# Patient Record
Sex: Female | Born: 1960 | Race: White | Hispanic: No | Marital: Single | Smoking: Former smoker
Health system: Southern US, Community
[De-identification: ages and names within clinical notes are randomized; demographics above are authoritative.]

## PROBLEM LIST (undated history)

## (undated) DIAGNOSIS — E039 Hypothyroidism, unspecified: Secondary | ICD-10-CM

## (undated) DIAGNOSIS — E785 Hyperlipidemia, unspecified: Secondary | ICD-10-CM

## (undated) DIAGNOSIS — F32A Depression, unspecified: Secondary | ICD-10-CM

## (undated) DIAGNOSIS — J449 Chronic obstructive pulmonary disease, unspecified: Secondary | ICD-10-CM

## (undated) DIAGNOSIS — J45909 Unspecified asthma, uncomplicated: Secondary | ICD-10-CM

## (undated) DIAGNOSIS — R7303 Prediabetes: Secondary | ICD-10-CM

## (undated) DIAGNOSIS — R011 Cardiac murmur, unspecified: Secondary | ICD-10-CM

## (undated) DIAGNOSIS — G4730 Sleep apnea, unspecified: Secondary | ICD-10-CM

## (undated) DIAGNOSIS — F1011 Alcohol abuse, in remission: Secondary | ICD-10-CM

## (undated) DIAGNOSIS — K219 Gastro-esophageal reflux disease without esophagitis: Secondary | ICD-10-CM

## (undated) DIAGNOSIS — F329 Major depressive disorder, single episode, unspecified: Secondary | ICD-10-CM

## (undated) DIAGNOSIS — R12 Heartburn: Secondary | ICD-10-CM

## (undated) DIAGNOSIS — D649 Anemia, unspecified: Secondary | ICD-10-CM

## (undated) DIAGNOSIS — F1010 Alcohol abuse, uncomplicated: Secondary | ICD-10-CM

## (undated) DIAGNOSIS — K921 Melena: Secondary | ICD-10-CM

## (undated) DIAGNOSIS — K635 Polyp of colon: Secondary | ICD-10-CM

## (undated) DIAGNOSIS — M549 Dorsalgia, unspecified: Secondary | ICD-10-CM

## (undated) DIAGNOSIS — J189 Pneumonia, unspecified organism: Secondary | ICD-10-CM

## (undated) DIAGNOSIS — E669 Obesity, unspecified: Secondary | ICD-10-CM

## (undated) DIAGNOSIS — K5900 Constipation, unspecified: Secondary | ICD-10-CM

## (undated) DIAGNOSIS — Z9289 Personal history of other medical treatment: Secondary | ICD-10-CM

## (undated) DIAGNOSIS — E079 Disorder of thyroid, unspecified: Secondary | ICD-10-CM

## (undated) DIAGNOSIS — E538 Deficiency of other specified B group vitamins: Secondary | ICD-10-CM

## (undated) DIAGNOSIS — F419 Anxiety disorder, unspecified: Secondary | ICD-10-CM

## (undated) HISTORY — DX: Anxiety disorder, unspecified: F41.9

## (undated) HISTORY — DX: Polyp of colon: K63.5

## (undated) HISTORY — DX: Disorder of thyroid, unspecified: E07.9

## (undated) HISTORY — DX: Hyperlipidemia, unspecified: E78.5

## (undated) HISTORY — DX: Melena: K92.1

## (undated) HISTORY — DX: Anemia, unspecified: D64.9

## (undated) HISTORY — DX: Alcohol abuse, uncomplicated: F10.10

## (undated) HISTORY — DX: Gastro-esophageal reflux disease without esophagitis: K21.9

## (undated) HISTORY — DX: Depression, unspecified: F32.A

## (undated) HISTORY — DX: Alcohol abuse, in remission: F10.11

## (undated) HISTORY — DX: Prediabetes: R73.03

## (undated) HISTORY — DX: Heartburn: R12

## (undated) HISTORY — DX: Sleep apnea, unspecified: G47.30

## (undated) HISTORY — DX: Major depressive disorder, single episode, unspecified: F32.9

## (undated) HISTORY — DX: Unspecified asthma, uncomplicated: J45.909

## (undated) HISTORY — DX: Cardiac murmur, unspecified: R01.1

## (undated) HISTORY — DX: Chronic obstructive pulmonary disease, unspecified: J44.9

## (undated) HISTORY — DX: Obesity, unspecified: E66.9

## (undated) HISTORY — DX: Deficiency of other specified B group vitamins: E53.8

## (undated) HISTORY — DX: Personal history of other medical treatment: Z92.89

## (undated) HISTORY — PX: BREAST BIOPSY: SHX20

## (undated) HISTORY — DX: Dorsalgia, unspecified: M54.9

## (undated) HISTORY — DX: Constipation, unspecified: K59.00

---

## 1985-11-26 HISTORY — PX: AUGMENTATION MAMMAPLASTY: SUR837

## 2004-11-26 DIAGNOSIS — Z9289 Personal history of other medical treatment: Secondary | ICD-10-CM

## 2004-11-26 HISTORY — DX: Personal history of other medical treatment: Z92.89

## 2007-09-05 ENCOUNTER — Encounter: Admission: RE | Admit: 2007-09-05 | Discharge: 2007-09-05 | Payer: Self-pay

## 2007-12-15 ENCOUNTER — Encounter: Admission: RE | Admit: 2007-12-15 | Discharge: 2007-12-15 | Payer: Self-pay

## 2007-12-26 ENCOUNTER — Encounter: Admission: RE | Admit: 2007-12-26 | Discharge: 2007-12-26 | Payer: Self-pay

## 2007-12-26 ENCOUNTER — Encounter (INDEPENDENT_AMBULATORY_CARE_PROVIDER_SITE_OTHER): Payer: Self-pay | Admitting: Diagnostic Radiology

## 2008-11-26 HISTORY — PX: LAPAROSCOPIC GASTRIC BANDING: SHX1100

## 2012-09-26 ENCOUNTER — Other Ambulatory Visit (HOSPITAL_BASED_OUTPATIENT_CLINIC_OR_DEPARTMENT_OTHER): Payer: Self-pay | Admitting: *Deleted

## 2012-09-26 DIAGNOSIS — Z1231 Encounter for screening mammogram for malignant neoplasm of breast: Secondary | ICD-10-CM

## 2013-01-05 ENCOUNTER — Ambulatory Visit (HOSPITAL_BASED_OUTPATIENT_CLINIC_OR_DEPARTMENT_OTHER): Payer: Self-pay

## 2013-02-20 ENCOUNTER — Ambulatory Visit (HOSPITAL_BASED_OUTPATIENT_CLINIC_OR_DEPARTMENT_OTHER)
Admission: RE | Admit: 2013-02-20 | Discharge: 2013-02-20 | Disposition: A | Discharge status: Home / Self Care | Payer: BC Managed Care – PPO | Source: Ambulatory Visit | Attending: Medical | Admitting: Medical

## 2013-02-20 DIAGNOSIS — Z1231 Encounter for screening mammogram for malignant neoplasm of breast: Secondary | ICD-10-CM

## 2015-07-20 ENCOUNTER — Other Ambulatory Visit (HOSPITAL_BASED_OUTPATIENT_CLINIC_OR_DEPARTMENT_OTHER): Payer: Self-pay | Admitting: Medical

## 2015-07-20 DIAGNOSIS — Z1231 Encounter for screening mammogram for malignant neoplasm of breast: Secondary | ICD-10-CM

## 2015-08-19 ENCOUNTER — Ambulatory Visit (HOSPITAL_BASED_OUTPATIENT_CLINIC_OR_DEPARTMENT_OTHER)
Admission: RE | Admit: 2015-08-19 | Discharge: 2015-08-19 | Disposition: A | Discharge status: Home / Self Care | Payer: BC Managed Care – PPO | Source: Ambulatory Visit | Attending: Medical | Admitting: Medical

## 2015-08-19 DIAGNOSIS — Z1231 Encounter for screening mammogram for malignant neoplasm of breast: Secondary | ICD-10-CM | POA: Diagnosis present

## 2016-10-03 ENCOUNTER — Other Ambulatory Visit (HOSPITAL_BASED_OUTPATIENT_CLINIC_OR_DEPARTMENT_OTHER): Payer: Self-pay | Admitting: Medical

## 2016-10-03 DIAGNOSIS — Z1231 Encounter for screening mammogram for malignant neoplasm of breast: Secondary | ICD-10-CM

## 2016-10-04 ENCOUNTER — Ambulatory Visit (HOSPITAL_BASED_OUTPATIENT_CLINIC_OR_DEPARTMENT_OTHER)
Admission: RE | Admit: 2016-10-04 | Discharge: 2016-10-04 | Disposition: A | Discharge status: Home / Self Care | Payer: BC Managed Care – PPO | Source: Ambulatory Visit | Attending: Medical | Admitting: Medical

## 2016-10-04 DIAGNOSIS — Z1231 Encounter for screening mammogram for malignant neoplasm of breast: Secondary | ICD-10-CM | POA: Insufficient documentation

## 2018-01-15 ENCOUNTER — Other Ambulatory Visit (HOSPITAL_BASED_OUTPATIENT_CLINIC_OR_DEPARTMENT_OTHER): Payer: Self-pay | Admitting: Medical

## 2018-01-15 DIAGNOSIS — Z1231 Encounter for screening mammogram for malignant neoplasm of breast: Secondary | ICD-10-CM

## 2018-01-27 ENCOUNTER — Encounter (HOSPITAL_BASED_OUTPATIENT_CLINIC_OR_DEPARTMENT_OTHER): Payer: Self-pay

## 2018-01-27 ENCOUNTER — Other Ambulatory Visit: Payer: Self-pay | Admitting: Family Medicine

## 2018-01-27 ENCOUNTER — Ambulatory Visit (HOSPITAL_BASED_OUTPATIENT_CLINIC_OR_DEPARTMENT_OTHER)
Admission: RE | Admit: 2018-01-27 | Discharge: 2018-01-27 | Disposition: A | Discharge status: Home / Self Care | Payer: BC Managed Care – PPO | Source: Ambulatory Visit | Attending: Medical | Admitting: Medical

## 2018-01-27 ENCOUNTER — Ambulatory Visit: Payer: BC Managed Care – PPO | Admitting: Family Medicine

## 2018-01-27 ENCOUNTER — Encounter: Payer: Self-pay | Admitting: Family Medicine

## 2018-01-27 VITALS — BP 118/82 | HR 93 | Temp 98.6°F | Ht 64.0 in | Wt 297.0 lb

## 2018-01-27 DIAGNOSIS — E039 Hypothyroidism, unspecified: Secondary | ICD-10-CM | POA: Diagnosis not present

## 2018-01-27 DIAGNOSIS — Z1231 Encounter for screening mammogram for malignant neoplasm of breast: Secondary | ICD-10-CM | POA: Diagnosis present

## 2018-01-27 DIAGNOSIS — Z9882 Breast implant status: Secondary | ICD-10-CM | POA: Insufficient documentation

## 2018-01-27 DIAGNOSIS — J449 Chronic obstructive pulmonary disease, unspecified: Secondary | ICD-10-CM | POA: Insufficient documentation

## 2018-01-27 DIAGNOSIS — G8929 Other chronic pain: Secondary | ICD-10-CM

## 2018-01-27 DIAGNOSIS — M25572 Pain in left ankle and joints of left foot: Secondary | ICD-10-CM | POA: Diagnosis not present

## 2018-01-27 LAB — T4, FREE: Free T4: 1.01 ng/dL (ref 0.60–1.60)

## 2018-01-27 LAB — TSH: TSH: 3.47 u[IU]/mL (ref 0.35–4.50)

## 2018-01-27 MED ORDER — LEVOTHYROXINE SODIUM 150 MCG PO TABS: 150.0000 ug | ORAL_TABLET | Freq: Every day | ORAL | 2 refills | Status: DC

## 2018-01-27 MED ORDER — ALBUTEROL SULFATE 108 (90 BASE) MCG/ACT IN AEPB: 1.0000 | INHALATION_SPRAY | RESPIRATORY_TRACT | 1 refills | Status: DC | PRN

## 2018-01-27 MED ORDER — UMECLIDINIUM BROMIDE 62.5 MCG/INH IN AEPB: 1.0000 | INHALATION_SPRAY | Freq: Every day | RESPIRATORY_TRACT | 5 refills | Status: DC

## 2018-01-27 NOTE — Progress Notes (Signed)
Chief Complaint  Patient presents with  . Establish Care       New Patient Visit SUBJECTIVE: HPI: Pamela [name] is an 57 y.o.female who is being seen for establishing care.  The patient was previously seen at [facility].    Pt had an ankle injury in Dec, 2018. Swelling was significant then, better now. Still having pain radiate up the outside of her L leg and in the front of her ankle. She was evaluated, but never had imaging and was not getting stretches/exercises. No numbness or tingling.  COPD Hx of COPD, has also been told she has asthma. In her records, dx is COPD with asthma. Has not had PFT's in several years. She has sig dyspnea on exertion, albuterol helps. She is also on Advair twice daily. She continues to intermittently smoke. She would like to exercise, but cannot 2/2 breathing.  No Known Allergies  Past Medical History:  Diagnosis Date  . Asthma   . Blood in stool   . Colon polyps   . Depression   . GERD (gastroesophageal reflux disease)   . Heart murmur   . History of blood transfusion 2006  . Thyroid disease   . UTI (urinary tract infection)    Past Surgical History:  Procedure Laterality Date  . AUGMENTATION MAMMAPLASTY    . BREAST BIOPSY    . LAPAROSCOPIC GASTRIC BANDING  2010   Social History   Socioeconomic History  . Marital status: Single  Tobacco Use  . Smoking status: Former Games developer  . Smokeless tobacco: Never Used  Substance and Sexual Activity  . Alcohol use: No    Frequency: Never  . Drug use: No   Family History  Problem Relation Age of Onset  . Alcohol abuse Mother   . Depression Mother   . Early death Mother   . Alcohol abuse Father      Current Outpatient Medications:  .  albuterol (2.5 MG/3ML) 0.083% NEBU 3 mL, albuterol (5 MG/ML) 0.5% NEBU 0.5 mL, Inhale 3 mg into the lungs as needed., Disp: , Rfl:  .  Albuterol Sulfate (PROAIR RESPICLICK) 108 (90 Base) MCG/ACT AEPB, Inhale 1 puff into the lungs as needed., Disp: , Rfl:  .   citalopram (CELEXA) 20 MG tablet, Take 20 mg by mouth daily., Disp: , Rfl:  .  clobetasol (TEMOVATE) 0.05 % external solution, Apply 1 application topically 2 (two) times daily., Disp: , Rfl:  .  Fluticasone-Salmeterol (ADVAIR) 500-50 MCG/DOSE AEPB, Inhale 1 puff into the lungs 2 (two) times daily., Disp: , Rfl:  .  levothyroxine (SYNTHROID, LEVOTHROID) 150 MCG tablet, Take 1 tablet (150 mcg total) by mouth daily before breakfast., Disp: 30 tablet, Rfl: 2 .  modafinil (PROVIGIL) 200 MG tablet, Take 200 mg by mouth daily., Disp: , Rfl:  .  zolpidem (AMBIEN) 10 MG tablet, Take 10 mg by mouth at bedtime as needed for sleep., Disp: , Rfl:  .  umeclidinium bromide (INCRUSE ELLIPTA) 62.5 MCG/INH AEPB, Inhale 1 puff into the lungs daily., Disp: 30 each, Rfl: 5  No LMP recorded. Patient is not currently having periods (Reason: Perimenopausal).  ROS Cardiovascular: Denies chest pain  Respiratory: Denies dyspnea   OBJECTIVE: BP 118/82 (BP Location: Left Arm, Patient Position: Sitting, Cuff Size: Large)   Pulse 93   Temp 98.6 F (37 C) (Oral)   Ht 5\' 4"  (1.626 m)   Wt 297 lb (134.7 kg)   SpO2 94%   BMI 50.98 kg/m   Constitutional: -  VS  reviewed -  Well developed, well nourished, appears stated age -  No apparent distress  Psychiatric: -  Oriented to person, place, and time -  Memory intact -  Affect and mood normal -  Fluent conversation, good eye contact -  Judgment and insight age appropriate  Eye: -  Conjunctivae clear, no discharge -  Pupils symmetric, round, reactive to light  ENMT: -  MMM    Pharynx moist, no exudate, no erythema  Neck: -  No gross swelling, no palpable masses  Cardiovascular: -  RRR -  No bruits  Respiratory: -  Normal respiratory effort, no accessory muscle use, no retraction -  Breath sounds equal, no wheezes, no ronchi, no crackles  Musculoskeletal: -  No clubbing, no cyanosis -  +TTP over ATFL, no bony ttp, no edema, neg ant drawer at ankle, neg squeeze   Skin: -  No significant lesion on inspection -  Warm and dry to palpation   ASSESSMENT/PLAN: Chronic obstructive pulmonary disease, unspecified COPD type (HCC) - Plan: umeclidinium bromide (INCRUSE ELLIPTA) 62.5 MCG/INH AEPB  Chronic pain of left ankle  Hypothyroidism, unspecified type - Plan: levothyroxine (SYNTHROID, LEVOTHROID) 150 MCG tablet, TSH, T4, free  Patient instructed to sign release of records form from her previous PCP. Add anticholinergic. If no improvement, would add Singulair. Theophylline if no better after that.  Stretches/exercises for ankle. If no better, will get MRI vs PT (if MRI not covered). Ck thyroid levels.  Patient should return in 1 mo.. The patient voiced understanding and agreement to the plan.   Jilda Rocheicholas Paul WestonWendling, DO 01/27/18  12:15 PM

## 2018-01-27 NOTE — Progress Notes (Signed)
Pre visit review using our clinic review tool, if applicable. No additional management support is needed unless otherwise documented below in the visit note. 

## 2018-01-27 NOTE — Patient Instructions (Addendum)
Heat (pad or rice pillow in microwave) over affected area, 10-15 minutes twice daily.   Ice/cold pack over area for 10-15 min twice daily.  Stop smoking.  Call Center for [facility] at [facility] at [phone] for an appointment.  They are located at [address], [place], [place], [address] (right across the hall from our office).   Ankle Exercises It is normal to feel mild stretching, pulling, tightness, or discomfort as you do these exercises, but you should stop right away if you feel sudden pain or your pain gets worse.  Stretching and range of motion exercises These exercises warm up your muscles and joints and improve the movement and flexibility of your ankle. These exercises also help to relieve pain, numbness, and tingling. Exercise A: Dorsiflexion/Plantar Flexion   1. Sit with your affected knee straight or bent. Do not rest your foot on anything. 2. Flex your affected ankle to tilt the top of your foot toward your shin. 3. Hold this position for 5 seconds. 4. Point your toes downward to tilt the top of your foot away from your shin. 5. Hold this position for 5 seconds. Repeat 2 times. Complete this exercise 3 times per week. Exercise B: Ankle Alphabet   1. Sit with your affected foot supported at your lower leg. ? Do not rest your foot on anything. ? Make sure your foot has room to move freely. 2. Think of your affected foot as a paintbrush, and move your foot to trace each letter of the alphabet in the air. Keep your hip and knee still while you trace. Make the letters as large as you can without increasing any discomfort. 3. Trace every letter from A to Z. Repeat 2 times. Complete this exercise 3 times per week. Strengthening exercises These exercises build strength and endurance in your ankle. Endurance is the ability to use your muscles for a long time, even after they get tired. Exercise D: Dorsiflexors   1. Secure a rubber  exercise band or tube to an object, such as a table leg, that will stay still when the band is pulled. Secure the other end around your affected foot. 2. Sit on the floor, facing the object with your affected leg extended. The band or tube should be slightly tense when your foot is relaxed. 3. Slowly flex your affected ankle and toes to bring your foot toward you. 4. Hold this position for 3 seconds.  5. Slowly return your foot to the starting position, controlling the band as you do that. Do a total of 10 repetitions. Repeat 2 times. Complete this exercise 3 times per week. Exercise E: Plantar Flexors   1. Sit on the floor with your affected leg extended. 2. Loop a rubber exercise band or tube around the ball of your affected foot. The ball of your foot is on the walking surface, right under your toes. The band or tube should be slightly tense when your foot is relaxed. 3. Slowly point your toes downward, pushing them away from you. 4. Hold this position for 3 seconds. 5. Slowly release the tension in the band or tube, controlling smoothly until your foot is back in the starting position. Repeat for a total of 10 repetitions. Repeat 2 times. Complete this exercise 3 times per week. Exercise F: Towel Curls   1. Sit in a chair on a non-carpeted surface, and put your feet on the floor. 2. Place a towel in front of your  feet.  3. Keeping your heel on the floor, put your affected foot on the towel. 4. Pull the towel toward you by grabbing the towel with your toes and curling them under. Keep your heel on the floor. 5. Let your toes relax. 6. Grab the towel again. Keep going until the towel is completely underneath your foot. Repeat for a total of 10 repetitions. Repeat 2 times. Complete this exercise 3 times per week. Exercise G: Heel Raise ( Plantar Flexors, Standing)    1. Stand with your feet shoulder-width apart. 2. Keep your weight spread evenly over the width of your feet while you  rise up on your toes. Use a wall or table to steady yourself, but try not to use it for support. 3. If this exercise is too easy, try these options: ? Shift your weight toward your affected leg until you feel challenged. ? If told by your health care provider, lift your uninjured leg off the floor. 4. Hold this position for 3 seconds. Repeat for a total of 10 repetitions. Repeat 2 times. Complete this exercise 3 times per week. Exercise H: Tandem Walking 1. Stand with one foot directly in front of the other. 2. Slowly raise your back foot up, lifting your heel before your toes, and place it directly in front of your other foot. 3. Continue to walk in this heel-to-toe way for 10 steps or for as long as told by your health care provider. Have a countertop or wall nearby to use if needed to keep your balance, but try not to hold onto anything for support. Repeat 2 times. Complete this exercises 3 times per week. Make sure you discuss any questions you have with your health care provider. Document Released: 09/26/2005 Document Revised: 07/12/2016 Document Reviewed: 07/31/2015 Elsevier Interactive Patient Education  2018 [employer]

## 2018-03-03 ENCOUNTER — Ambulatory Visit: Payer: BC Managed Care – PPO | Admitting: Family Medicine

## 2018-03-06 ENCOUNTER — Ambulatory Visit: Payer: BC Managed Care – PPO | Admitting: Family Medicine

## 2018-03-13 ENCOUNTER — Ambulatory Visit: Payer: BC Managed Care – PPO | Admitting: Family Medicine

## 2018-03-13 ENCOUNTER — Encounter: Payer: Self-pay | Admitting: Family Medicine

## 2018-03-13 VITALS — BP 130/88 | HR 86 | Temp 98.5°F | Ht 64.0 in | Wt 300.2 lb

## 2018-03-13 DIAGNOSIS — G8929 Other chronic pain: Secondary | ICD-10-CM

## 2018-03-13 DIAGNOSIS — M25572 Pain in left ankle and joints of left foot: Secondary | ICD-10-CM | POA: Diagnosis not present

## 2018-03-13 DIAGNOSIS — J449 Chronic obstructive pulmonary disease, unspecified: Secondary | ICD-10-CM | POA: Insufficient documentation

## 2018-03-13 MED ORDER — FLUTICASONE-SALMETEROL 500-50 MCG/DOSE IN AEPB: 1.0000 | INHALATION_SPRAY | Freq: Two times a day (BID) | RESPIRATORY_TRACT | 11 refills | Status: DC

## 2018-03-13 NOTE — Progress Notes (Signed)
Musculoskeletal Exam  Patient: Pamela [name] DOB: 08/27/1961  DOS: 03/13/2018  SUBJECTIVE:  Chief Complaint:   Chief Complaint  Patient presents with  . Ankle Pain    Pamela [name] is a 57 y.o.  female for evaluation and treatment of L ankle pain.   Onset: Dec 2018 Location: Has moved to lateral L ankle Reports around 50% improvement and continues to improve since seeing me last.  Associated symptoms: Worse when she wears heels Neurovascular symptoms: no  She is also frustrated w wt issue. Reports eating cleanly, tries to stay physically active. She has had a lap band in past. Not interested in any weight loss medicine.   Of note, she does report improvement with the Incruse. There is at least a component of COPD present given this.   ROS: Musculoskeletal/Extremities: +L ankle pain  Past Medical History:  Diagnosis Date  . Asthma   . Blood in stool   . Colon polyps   . COPD (chronic obstructive pulmonary disease) (HCC)   . Depression   . GERD (gastroesophageal reflux disease)   . Heart murmur   . History of alcohol abuse   . History of blood transfusion 2006  . Thyroid disease     Objective: VITAL SIGNS: BP 130/88 (BP Location: Left Arm, Patient Position: Sitting, Cuff Size: Large)   Pulse 86   Temp 98.5 F (36.9 C) (Oral)   Ht 5\' 4"  (1.626 m)   Wt (!) 300 lb 4 oz (136.2 kg)   SpO2 97%   BMI 51.54 kg/m  Constitutional: Well formed, well developed. No acute distress. Cardiovascular: Brisk cap refill Thorax & Lungs: No accessory muscle use Musculoskeletal: L ankle.   Normal active range of motion: yes.   Normal passive range of motion: yes Tenderness to palpation: mild ttp over msc posterior to fib Deformity: no Ecchymosis: no Tests positive: none Tests negative: squeeze, anterior drawre Neurologic: Normal sensory function. No focal deficits noted.  Psychiatric: Normal mood. Age appropriate judgment and insight. Alert & oriented x 3.     Assessment:  Chronic pain of left ankle  Morbid obesity (HCC) - Plan: Amb Ref to Medical Weight Management  Chronic obstructive pulmonary disease, unspecified COPD type (HCC)  Plan: Orders as above. Cont current therapy, offered PT but since she is improving, will hold off. Refer to Dr. Francena HanlyBeasley's team. Counseled on diet and exercise. Cont above meds . F/u for cpe at earliest convenience. The patient voiced understanding and agreement to the plan.   Jilda Rocheicholas Paul AshleyWendling, DO 03/13/18  10:04 AM

## 2018-03-13 NOTE — Progress Notes (Signed)
Pre visit review using our clinic review tool, if applicable. No additional management support is needed unless otherwise documented below in the visit note. 

## 2018-03-13 NOTE — Patient Instructions (Signed)
If you do not hear anything about your referral in the next 1-2 weeks, call our office and ask for an update.  Continue the exercises.  Heat (pad or rice pillow in microwave) over affected area, 10-15 minutes twice daily.   Let us know if you need anything.

## 2018-05-08 ENCOUNTER — Other Ambulatory Visit: Payer: Self-pay | Admitting: Family Medicine

## 2018-05-08 ENCOUNTER — Telehealth: Payer: Self-pay | Admitting: Family Medicine

## 2018-05-08 NOTE — Telephone Encounter (Signed)
Copied from CRM [id number]. Topic: Quick Communication - Rx Refill/Question >> May 08, 2018 10:46 AM Oneal GroutSebastian, Jennifer S wrote: Medication: Albuterol Sulfate (PROAIR RESPICLICK) 108 (90 Base) MCG/ACT AEPB  Has the patient contacted their pharmacy? Yes.   (Agent: If no, request that the patient contact the pharmacy for the refill.) (Agent: If yes, when and what did the pharmacy advise?)  Preferred Pharmacy (with phone number or street name): [facility] on [address]  Agent: Please be advised that RX refills may take up to 3 business days. We ask that you follow-up with your pharmacy.

## 2018-05-22 ENCOUNTER — Encounter: Payer: Self-pay | Admitting: Family Medicine

## 2018-05-22 ENCOUNTER — Encounter (INDEPENDENT_AMBULATORY_CARE_PROVIDER_SITE_OTHER): Payer: Self-pay

## 2018-05-23 ENCOUNTER — Encounter: Payer: Self-pay | Admitting: Family Medicine

## 2018-05-23 ENCOUNTER — Ambulatory Visit (INDEPENDENT_AMBULATORY_CARE_PROVIDER_SITE_OTHER): Payer: BC Managed Care – PPO | Admitting: Family Medicine

## 2018-05-23 VITALS — BP 128/82 | HR 79 | Temp 98.6°F | Ht 64.0 in | Wt 299.2 lb

## 2018-05-23 DIAGNOSIS — Z1159 Encounter for screening for other viral diseases: Secondary | ICD-10-CM | POA: Diagnosis not present

## 2018-05-23 DIAGNOSIS — Z0001 Encounter for general adult medical examination with abnormal findings: Secondary | ICD-10-CM | POA: Diagnosis not present

## 2018-05-23 DIAGNOSIS — Z114 Encounter for screening for human immunodeficiency virus [HIV]: Secondary | ICD-10-CM

## 2018-05-23 DIAGNOSIS — L309 Dermatitis, unspecified: Secondary | ICD-10-CM | POA: Diagnosis not present

## 2018-05-23 DIAGNOSIS — B354 Tinea corporis: Secondary | ICD-10-CM

## 2018-05-23 DIAGNOSIS — Z23 Encounter for immunization: Secondary | ICD-10-CM

## 2018-05-23 DIAGNOSIS — Z0000 Encounter for general adult medical examination without abnormal findings: Secondary | ICD-10-CM

## 2018-05-23 LAB — COMPREHENSIVE METABOLIC PANEL
ALT: 13 U/L (ref 0–35)
AST: 14 U/L (ref 0–37)
Albumin: 4 g/dL (ref 3.5–5.2)
Alkaline Phosphatase: 79 U/L (ref 39–117)
BILIRUBIN TOTAL: 0.4 mg/dL (ref 0.2–1.2)
BUN: 14 mg/dL (ref 6–23)
CO2: 28 meq/L (ref 19–32)
CREATININE: 0.62 mg/dL (ref 0.40–1.20)
Calcium: 9.2 mg/dL (ref 8.4–10.5)
Chloride: 103 mEq/L (ref 96–112)
GFR: 105.11 mL/min (ref >60.00)
GLUCOSE: 108 mg/dL — AB (ref 70–99)
Potassium: 4.6 mEq/L (ref 3.5–5.1)
Sodium: 139 mEq/L (ref 135–145)
TOTAL PROTEIN: 6.6 g/dL (ref 6.0–8.3)

## 2018-05-23 LAB — LIPID PANEL
CHOL/HDL RATIO: 5
Cholesterol: 253 mg/dL — ABNORMAL HIGH (ref 0–200)
HDL: 51.5 mg/dL (ref >39.00)
LDL Cholesterol: 185 mg/dL — ABNORMAL HIGH (ref 0–99)
NONHDL: 201.34
Triglycerides: 83 mg/dL (ref 0.0–149.0)
VLDL: 16.6 mg/dL (ref 0.0–40.0)

## 2018-05-23 MED ORDER — TRIAMCINOLONE ACETONIDE 0.1 % EX CREA: TOPICAL_CREAM | CUTANEOUS | 0 refills | Status: AC

## 2018-05-23 MED ORDER — KETOCONAZOLE 2 % EX CREA: TOPICAL_CREAM | CUTANEOUS | 0 refills | Status: DC

## 2018-05-23 NOTE — Progress Notes (Signed)
Pre visit review using our clinic review tool, if applicable. No additional management support is needed unless otherwise documented below in the visit note. 

## 2018-05-23 NOTE — Patient Instructions (Addendum)
Call your pharmacy to see about the availability of the new shingles vaccine (Shingrix).  Consider getting an ankle brace for swelling and support. Let me know if you change your mind about seeing physical therapy or a sports doc.   Let us know if you need anything.

## 2018-05-23 NOTE — Progress Notes (Signed)
Chief Complaint  Patient presents with  . Annual Exam     Well Woman Pamela [name] is here for a complete physical.   Her last physical was >1 year ago.  Current diet: in general, a "healthy" diet. Current exercise: 3x/week does water aerobics.  Weight is stable and she denies daytime fatigue. No LMP recorded. (Menstrual status: Perimenopausal). Seatbelt? Yes  Health Maintenance Pap/HPV- Yes Mammogram- Yes Tetanus- No Hep C screening- No HIV screening- No  Past Medical History:  Diagnosis Date  . Asthma   . Blood in stool   . Colon polyps   . COPD (chronic obstructive pulmonary disease) (HCC)   . Depression   . GERD (gastroesophageal reflux disease)   . Heart murmur   . History of alcohol abuse   . History of blood transfusion 2006  . Thyroid disease      Past Surgical History:  Procedure Laterality Date  . AUGMENTATION MAMMAPLASTY    . BREAST BIOPSY    . LAPAROSCOPIC GASTRIC BANDING  2010    Medications  Current Outpatient Medications on File Prior to Visit  Medication Sig Dispense Refill  . albuterol (2.5 MG/3ML) 0.083% NEBU 3 mL, albuterol (5 MG/ML) 0.5% NEBU 0.5 mL Inhale 3 mg into the lungs as needed.    . citalopram (CELEXA) 20 MG tablet Take 20 mg by mouth daily.    . clobetasol (TEMOVATE) 0.05 % external solution Apply 1 application topically 2 (two) times daily.    . Fluticasone-Salmeterol (ADVAIR) 500-50 MCG/DOSE AEPB Inhale 1 puff into the lungs 2 (two) times daily. 60 each 11  . levothyroxine (SYNTHROID, LEVOTHROID) 150 MCG tablet Take 1 tablet (150 mcg total) by mouth daily before breakfast. 30 tablet 2  . modafinil (PROVIGIL) 200 MG tablet Take 200 mg by mouth daily.    [name]. PROAIR RESPICLICK 108 (90 Base) MCG/ACT AEPB INHALE 1 PUFF BY MOUTH( INTO THE LUNGS) AS NEEDED 1 each 3  . umeclidinium bromide (INCRUSE ELLIPTA) 62.5 MCG/INH AEPB Inhale 1 puff into the lungs daily. 30 each 5  . zolpidem (AMBIEN) 10 MG tablet Take 10 mg by mouth at bedtime as  needed for sleep.     Allergies No Known Allergies  Review of Systems: Constitutional:  no unexpected weight changes Eye:  no recent significant change in vision Ear/Nose/Mouth/Throat:  Ears:  no tinnitus or vertigo and no recent change in hearing Nose/Mouth/Throat:  no complaints of nasal congestion, no sore throat Cardiovascular: no chest pain Respiratory:  no cough and no shortness of breath Gastrointestinal:  no abdominal pain, no change in bowel habits GU:  Female: negative for dysuria or pelvic pain Musculoskeletal/Extremities: +L ankle pain/swelling; otherwise no pain of the joints Integumentary (Skin/Breast): +itchy skin on backs of hands, ringworm on LUE?; otherwise no abnormal skin lesions reported Neurologic:  no headaches Endocrine:  denies fatigue Hematologic/Lymphatic:  No areas of easy bleeding  Exam BP 128/82 (BP Location: Left Arm, Patient Position: Sitting, Cuff Size: Large)   Pulse 79   Temp 98.6 F (37 C) (Oral)   Ht 5\' 4"  (1.626 m)   Wt 299 lb 4 oz (135.7 kg)   SpO2 93%   BMI 51.37 kg/m  General:  well developed, well nourished, in no apparent distress Skin: Erythematous and scaly lesions on back of hands b/l; there is a circular and scaly/slightly hyperpigmented lesion on the L medial upper arm; otherwise no significant moles, warts, or growths Head:  no masses, lesions, or tenderness Eyes:  pupils equal and  round, sclera anicteric without injection Ears:  canals without lesions, TMs shiny without retraction, no obvious effusion, no erythema Nose:  nares patent, septum midline, mucosa normal, and no drainage or sinus tenderness Throat/Pharynx:  lips and gingiva without lesion; tongue and uvula midline; non-inflamed pharynx; no exudates or postnasal drainage Neck: neck supple without adenopathy, thyromegaly, or masses Lungs:  clear to auscultation, breath sounds equal bilaterally, no respiratory distress Cardio:  regular rate and rhythm, no bruits, no LE  edema Abdomen:  abdomen soft, nontender; bowel sounds normal; no masses or organomegaly Genital: Defer to GYN Musculoskeletal:  symmetrical muscle groups noted without atrophy or deformity Extremities:  no clubbing, cyanosis, or edema, no deformities, no skin discoloration Neuro:  gait normal; deep tendon reflexes normal and symmetric Psych: well oriented with normal range of affect and appropriate judgment/insight  Assessment and Plan  Well adult exam - Plan: Comprehensive metabolic panel, Lipid panel  Tinea corporis - Plan: ketoconazole (NIZORAL) 2 % cream  Encounter for hepatitis C screening test for low risk patient - Plan: Hepatitis C antibody  Screening for HIV (human immunodeficiency virus) - Plan: HIV antibody  Dermatitis - Plan: triamcinolone cream (KENALOG) 0.1 %  Need for tetanus booster - Plan: Tdap vaccine greater than or equal to 7yo IM   Well 57 y.o. female.  Counseled on diet and exercise. Pt is following with [facility] team. Other orders as above. Follow up in 6 mo or prn. Pt will let us know if she wishes to est w sports med or PT regarding her L ankle. The patient voiced understanding and agreement to the plan.  [name] Cunard, DO 05/23/18 8:48 AM

## 2018-05-24 LAB — HEPATITIS C ANTIBODY
HEP C AB: NONREACTIVE
SIGNAL TO CUT-OFF: 0.02 (ref <1.00)

## 2018-05-24 LAB — HIV ANTIBODY (ROUTINE TESTING W REFLEX): HIV: NONREACTIVE

## 2018-05-25 ENCOUNTER — Other Ambulatory Visit: Payer: Self-pay | Admitting: Family Medicine

## 2018-05-25 DIAGNOSIS — R739 Hyperglycemia, unspecified: Secondary | ICD-10-CM

## 2018-05-25 DIAGNOSIS — E7800 Pure hypercholesterolemia, unspecified: Secondary | ICD-10-CM

## 2018-05-26 ENCOUNTER — Telehealth: Payer: Self-pay | Admitting: Family Medicine

## 2018-05-26 ENCOUNTER — Encounter: Payer: Self-pay | Admitting: Family Medicine

## 2018-05-26 NOTE — Telephone Encounter (Signed)
Results given and documented in result note. 

## 2018-05-26 NOTE — Telephone Encounter (Signed)
Copied from CRM [id number]. Topic: Quick Communication - Lab Results >> May 26, 2018  9:23 AM Ewing, Vladimir Croftsobin B, CMA wrote: Called patient to inform them of 05/26/2018 lab results. When patient returns call, triage nurse may disclose results. >> May 26, 2018  9:35 AM Cipriano BunkerLambe, Annette S wrote: Pt calling back on lab results  [facility] can give results Please call her back on work phone [phone] Ext. [phone]

## 2018-06-05 ENCOUNTER — Encounter (INDEPENDENT_AMBULATORY_CARE_PROVIDER_SITE_OTHER): Payer: Self-pay | Admitting: Family Medicine

## 2018-06-05 ENCOUNTER — Ambulatory Visit (INDEPENDENT_AMBULATORY_CARE_PROVIDER_SITE_OTHER): Payer: BC Managed Care – PPO | Admitting: Family Medicine

## 2018-06-05 VITALS — BP 163/92 | HR 66 | Temp 98.1°F | Ht 64.0 in | Wt 298.0 lb

## 2018-06-05 DIAGNOSIS — R0602 Shortness of breath: Secondary | ICD-10-CM

## 2018-06-05 DIAGNOSIS — Z0289 Encounter for other administrative examinations: Secondary | ICD-10-CM

## 2018-06-05 DIAGNOSIS — Z6841 Body Mass Index (BMI) 40.0 and over, adult: Secondary | ICD-10-CM

## 2018-06-05 DIAGNOSIS — R739 Hyperglycemia, unspecified: Secondary | ICD-10-CM | POA: Diagnosis not present

## 2018-06-05 DIAGNOSIS — Z1331 Encounter for screening for depression: Secondary | ICD-10-CM | POA: Diagnosis not present

## 2018-06-05 DIAGNOSIS — R5383 Other fatigue: Secondary | ICD-10-CM

## 2018-06-05 DIAGNOSIS — Z9189 Other specified personal risk factors, not elsewhere classified: Secondary | ICD-10-CM | POA: Diagnosis not present

## 2018-06-05 NOTE — Progress Notes (Signed)
.  Office: [phone]  /  Fax: [phone]   HPI:   Chief Complaint: OBESITY  Pamela OharaDorothy Ann [name] (MR# [id number]) is a 57 y.o. female who presents on 06/05/2018 for obesity evaluation and treatment. Current BMI is Body mass index is 51.15 kg/m.[name]. Pamela [name] has struggled with obesity for years and has been unsuccessful in either losing weight or maintaining long term weight loss. Pamela [name] attended our information session and states she is currently in the action stage of change and ready to dedicate time achieving and maintaining a healthier weight.  Pamela [name] states her desired weight loss is 138 lbs she started gaining weight in her late 30's her heaviest weight ever was 325 lbs. she is a picky eater and doesn't like to eat healthier foods  she has significant food cravings issues  she snacks frequently in the evenings she skips meals frequently she is frequently drinking liquids with calories she frequently makes poor food choices she struggles with emotional eating   Status Post weight loss surgery 2010. Pamela [name] [facility]. Pamela [name] lost from 325 lbs down to 260 lbs in one year and started regaining weight after four years.She still has restriction and struggles to eat bread, french fries and dry chicken. She can eat these things, but it takes a long time. She hasn't adjusted her band in approximately 10 years.   Fatigue Pamela [name] feels her energy is lower than it should be. This has worsened with weight gain and has not worsened recently. Pamela [name] admits to daytime somnolence and admits to waking up still tired. Patient is at risk for obstructive sleep apnea. Pamela [name] has a history of symptoms of daytime fatigue, morning fatigue and morning headache. Patient generally gets 8 hours of sleep per night, and states they generally have restless sleep. Snoring is present. Apneic episodes are present. Epworth Sleepiness Score is 5  Dyspnea on exertion Pamela [name] notes increasing shortness  of breath with exercising and seems to be worsening over time with weight gain. She notes getting out of breath sooner with activity than she used to. This has not gotten worse recently. Pamela [name] denies orthopnea.  Hyperglycemia Pamela [name] has an elevated fasting glucose without a diagnosis of diabetes. There is no recent Hgb A1c She admits to polyphagia.  At risk for diabetes Pamela [name] is at higher than average risk for developing diabetes due to her obesity and hyperglycemia. She currently admits polyuria and denies polydipsia.  Depression Screen Pamela [name]'s Food and Mood (modified PHQ-9) score was  Depression screen PHQ 2/9 06/05/2018  Decreased Interest 3  Down, Depressed, Hopeless 1  PHQ - 2 Score 4  Altered sleeping 2  Tired, decreased energy 3  Change in appetite 1  Feeling bad or failure about yourself  1  Trouble concentrating 1  Moving slowly or fidgety/restless 3  Suicidal thoughts 0  PHQ-9 Score 15  Difficult doing work/chores Somewhat difficult    ALLERGIES: No Known Allergies  MEDICATIONS: Current Outpatient Medications on File Prior to Visit  Medication Sig Dispense Refill  . albuterol (2.5 MG/3ML) 0.083% NEBU 3 mL, albuterol (5 MG/ML) 0.5% NEBU 0.5 mL Inhale 3 mg into the lungs as needed.    . citalopram (CELEXA) 20 MG tablet Take 20 mg by mouth daily.    . clobetasol (TEMOVATE) 0.05 % external solution Apply 1 application topically 2 (two) times daily.    . Fluticasone-Salmeterol (WIXELA INHUB) 500-50 MCG/DOSE AEPB Inhale 1 puff into the lungs 2 (two) times daily.    [name]. levothyroxine (SYNTHROID,  LEVOTHROID) 150 MCG tablet Take 1 tablet (150 mcg total) by mouth daily before breakfast. 30 tablet 2  . modafinil (PROVIGIL) 200 MG tablet Take 200 mg by mouth daily.    [name] omeprazole (PRILOSEC) 40 MG capsule Take 40 mg by mouth daily.    [name] PROAIR RESPICLICK 108 (90 Base) MCG/ACT AEPB INHALE 1 PUFF BY MOUTH( INTO THE LUNGS) AS NEEDED 1 each 3  . umeclidinium bromide (INCRUSE  ELLIPTA) 62.5 MCG/INH AEPB Inhale 1 puff into the lungs daily. 30 each 5  . zolpidem (AMBIEN) 10 MG tablet Take 10 mg by mouth at bedtime as needed for sleep.     No current facility-administered medications on file prior to visit.     PAST MEDICAL HISTORY: Past Medical History:  Diagnosis Date  . Alcohol abuse   . Anemia   . Asthma   . Blood in stool   . Colon polyps   . COPD (chronic obstructive pulmonary disease) (HCC)   . Depression   . Depression   . GERD (gastroesophageal reflux disease)   . Heart murmur   . History of alcohol abuse   . History of blood transfusion 2006  . HLD (hyperlipidemia)   . Obesity   . Sleep apnea   . Thyroid disease     PAST SURGICAL HISTORY: Past Surgical History:  Procedure Laterality Date  . AUGMENTATION MAMMAPLASTY    . BREAST BIOPSY    . LAPAROSCOPIC GASTRIC BANDING  2010    SOCIAL HISTORY: Social History   Tobacco Use  . Smoking status: Former Games developer  . Smokeless tobacco: Never Used  Substance Use Topics  . Alcohol use: No    Frequency: Never  . Drug use: No    FAMILY HISTORY: Family History  Problem Relation Age of Onset  . Alcohol abuse Mother   . Depression Mother   . Early death Mother   . Liver disease Mother   . Alcohol abuse Father   . Obesity Father   . Cancer Father     ROS: Review of Systems  Constitutional: Positive for malaise/fatigue.  HENT: Positive for sinus pain.   Eyes:       Wear Glasses or Contacts  Respiratory: Positive for shortness of breath (with activity) and wheezing.   Cardiovascular: Negative for orthopnea.  Gastrointestinal: Positive for heartburn.  Genitourinary: Positive for frequency.  Musculoskeletal:       Muscle Stiffness   Skin: Positive for itching.       Dryness   Endo/Heme/Allergies: Negative for polydipsia. Bruises/bleeds easily (bruising).       Positive for polyphagia    PHYSICAL EXAM: Blood pressure (!) 163/92, pulse 66, temperature 98.1 F (36.7 C),  temperature source Oral, height 5\' 4"  (1.626 m), weight 298 lb (135.2 kg), SpO2 95 %. Body mass index is 51.15 kg/m. Physical Exam  Constitutional: She is oriented to person, place, and time. She appears well-developed and well-nourished.  HENT:  Head: Normocephalic and atraumatic.  Nose: Nose normal.  Eyes: EOM are normal. No scleral icterus.  Neck: Normal range of motion. Neck supple. No thyromegaly present.  Cardiovascular: Normal rate and regular rhythm.  Pulmonary/Chest: Effort normal. No respiratory distress.  Abdominal: Soft. There is no tenderness.  + obesity  Musculoskeletal: Normal range of motion.  Range of Motion normal in all 4 extremities  Neurological: She is alert and oriented to person, place, and time. Coordination normal.  Skin: Skin is warm and dry.  Psychiatric: She has a normal mood and affect.  Her behavior is normal.  Vitals reviewed.   RECENT LABS AND TESTS: BMET    Component Value Date/Time   NA 139 05/23/2018 0853   K 4.6 05/23/2018 0853   CL 103 05/23/2018 0853   CO2 28 05/23/2018 0853   GLUCOSE 108 (H) 05/23/2018 0853   BUN 14 05/23/2018 0853   CREATININE 0.62 05/23/2018 0853   CALCIUM 9.2 05/23/2018 0853   No results found for: HGBA1C No results found for: INSULIN CBC No results found for: WBC, RBC, HGB, HCT, PLT, MCV, MCH, MCHC, RDW, LYMPHSABS, MONOABS, EOSABS, BASOSABS Iron/TIBC/Ferritin/ %Sat No results found for: IRON, TIBC, FERRITIN, IRONPCTSAT Lipid Panel     Component Value Date/Time   CHOL 253 (H) 05/23/2018 0853   TRIG 83.0 05/23/2018 0853   HDL 51.50 05/23/2018 0853   CHOLHDL 5 05/23/2018 0853   VLDL 16.6 05/23/2018 0853   LDLCALC 185 (H) 05/23/2018 0853   Hepatic Function Panel     Component Value Date/Time   PROT 6.6 05/23/2018 0853   ALBUMIN 4.0 05/23/2018 0853   AST 14 05/23/2018 0853   ALT 13 05/23/2018 0853   ALKPHOS 79 05/23/2018 0853   BILITOT 0.4 05/23/2018 0853      Component Value Date/Time   TSH 3.47  01/27/2018 1143   Vitamin D There are no recent lab results  ECG  shows NSR with a rate of 72 BPM INDIRECT CALORIMETER done today shows a VO2 of 315 and a REE of 2191. Her calculated basal metabolic rate is 1610 thus her basal metabolic rate is better than expected.    ASSESSMENT AND PLAN: Other fatigue - Plan: EKG 12-Lead, Comprehensive metabolic panel, CBC With Differential, VITAMIN D 25 Hydroxy (Vit-D Deficiency, Fractures), Vitamin B12, Folate, T4, free, TSH, T3  Shortness of breath on exertion  Hyperglycemia - Plan: Comprehensive metabolic panel, Insulin, random  Depression screening  At risk for diabetes mellitus  Class 3 severe obesity with serious comorbidity and body mass index (BMI) of 50.0 to 59.9 in adult, unspecified obesity type (HCC)  PLAN:  Fatigue Pamela [name] was informed that her fatigue may be related to obesity, depression or many other causes. Labs will be ordered, and in the meanwhile Pamela [name] has agreed to work on diet, exercise and weight loss to help with fatigue. Proper sleep hygiene was discussed including the need for 7-8 hours of quality sleep each night. A sleep study was not ordered based on symptoms and Epworth score.  Dyspnea on exertion Pamela [name] shortness of breath appears to be obesity related and exercise induced. She has agreed to work on weight loss and gradually increase exercise to treat her exercise induced shortness of breath. If Adilynn follows our instructions and loses weight without improvement of her shortness of breath, we will plan to refer to pulmonology. We will monitor this condition regularly. Pamela [name] agrees to this plan.  Hyperglycemia Fasting labs will be obtained and results with be discussed with Pamela [name] in 2 weeks at her follow up visit. In the meanwhile Pamela [name] was started on a lower simple carbohydrate diet prescription and she will work on weight loss efforts.  Diabetes risk counseling Pamela [name] was given extended (15  minutes) diabetes prevention counseling today. She is 57 y.o. female and has risk factors for diabetes including obesity and hyperglycemia. We discussed intensive lifestyle modifications today with an emphasis on weight loss as well as increasing exercise and decreasing simple carbohydrates in her diet.  Depression Screen Pamela [name] had a moderately positive depression screening. Depression is commonly associated  with obesity and often results in emotional eating behaviors. We will monitor this closely and work on CBT to help improve the non-hunger eating patterns. Referral to Psychology may be required if no improvement is seen as she continues in our clinic.  Obesity Pamela [name] is currently in the action stage of change and her goal is to continue with weight loss efforts She has agreed to follow the Category 1 plan +100 calories Pamela [name] has been instructed to work up to a goal of 150 minutes of combined cardio and strengthening exercise per week for weight loss and overall health benefits. We discussed the following Behavioral Modification Strategies today: increasing lean protein intake and decreasing simple carbohydrates   Pamela [name] has agreed to follow up with our clinic in 2 weeks. She was informed of the importance of frequent follow up visits to maximize her success with intensive lifestyle modifications for her multiple health conditions. She was informed we would discuss her lab results at her next visit unless there is a critical issue that needs to be addressed sooner. Pamela [name] agreed to keep her next visit at the agreed upon time to discuss these results.    OBESITY BEHAVIORAL INTERVENTION VISIT  Today's visit was # 1 out of 22.  Starting weight: 298 lbs Starting date: 06/05/18 Today's weight : 298 lbs Today's date: 06/05/2018 Total lbs lost to date: 0 (Patients must lose 7 lbs in the first 6 months to continue with counseling)   ASK: We discussed the diagnosis of obesity with Pamela [name] today and Lillymae agreed to give [name] permission to discuss obesity behavioral modification therapy today.  ASSESS: Laguana has the diagnosis of obesity and her BMI today is 51.13 Laveta is in the action stage of change   ADVISE: Gwendolin was educated on the multiple health risks of obesity as well as the benefit of weight loss to improve her health. She was advised of the need for long term treatment and the importance of lifestyle modifications.  AGREE: Multiple dietary modification options and treatment options were discussed and  Kortne agreed to the above obesity treatment plan.   Cristi Loron, am acting as transcriptionist for Quillian Quince, MD

## 2018-06-06 LAB — COMPREHENSIVE METABOLIC PANEL
A/G RATIO: 1.5 (ref 1.2–2.2)
ALBUMIN: 4.1 g/dL (ref 3.5–5.5)
ALT: 20 IU/L (ref 0–32)
AST: 18 IU/L (ref 0–40)
Alkaline Phosphatase: 89 IU/L (ref 39–117)
BUN / CREAT RATIO: 17 (ref 9–23)
BUN: 10 mg/dL (ref 6–24)
Bilirubin Total: 0.3 mg/dL (ref 0.0–1.2)
CALCIUM: 9.2 mg/dL (ref 8.7–10.2)
CO2: 23 mmol/L (ref 20–29)
Chloride: 99 mmol/L (ref 96–106)
Creatinine, Ser: 0.58 mg/dL (ref 0.57–1.00)
GFR, EST AFRICAN AMERICAN: 118 mL/min/{1.73_m2} (ref >59)
GFR, EST NON AFRICAN AMERICAN: 103 mL/min/{1.73_m2} (ref >59)
GLOBULIN, TOTAL: 2.7 g/dL (ref 1.5–4.5)
Glucose: 99 mg/dL (ref 65–99)
POTASSIUM: 4.7 mmol/L (ref 3.5–5.2)
SODIUM: 138 mmol/L (ref 134–144)
TOTAL PROTEIN: 6.8 g/dL (ref 6.0–8.5)

## 2018-06-06 LAB — T3: T3 TOTAL: 124 ng/dL (ref 71–180)

## 2018-06-06 LAB — CBC WITH DIFFERENTIAL
BASOS: 1 %
Basophils Absolute: 0.1 10*3/uL (ref 0.0–0.2)
EOS (ABSOLUTE): 0.1 10*3/uL (ref 0.0–0.4)
EOS: 2 %
HEMATOCRIT: 45.3 % (ref 34.0–46.6)
Hemoglobin: 15.2 g/dL (ref 11.1–15.9)
Immature Grans (Abs): 0 10*3/uL (ref 0.0–0.1)
Immature Granulocytes: 1 %
LYMPHS ABS: 2.1 10*3/uL (ref 0.7–3.1)
Lymphs: 33 %
MCH: 29.1 pg (ref 26.6–33.0)
MCHC: 33.6 g/dL (ref 31.5–35.7)
MCV: 87 fL (ref 79–97)
MONOS ABS: 0.4 10*3/uL (ref 0.1–0.9)
Monocytes: 6 %
NEUTROS ABS: 3.5 10*3/uL (ref 1.4–7.0)
Neutrophils: 57 %
RBC: 5.23 x10E6/uL (ref 3.77–5.28)
RDW: 15.2 % (ref 12.3–15.4)
WBC: 6.2 10*3/uL (ref 3.4–10.8)

## 2018-06-06 LAB — INSULIN, RANDOM: INSULIN: 10.9 u[IU]/mL (ref 2.6–24.9)

## 2018-06-06 LAB — FOLATE: FOLATE: 3.4 ng/mL (ref >3.0)

## 2018-06-06 LAB — VITAMIN B12: VITAMIN B 12: 477 pg/mL (ref 232–1245)

## 2018-06-06 LAB — VITAMIN D 25 HYDROXY (VIT D DEFICIENCY, FRACTURES): Vit D, 25-Hydroxy: 18.6 ng/mL — ABNORMAL LOW (ref 30.0–100.0)

## 2018-06-06 LAB — TSH: TSH: 4.45 u[IU]/mL (ref 0.450–4.500)

## 2018-06-06 LAB — T4, FREE: Free T4: 1.37 ng/dL (ref 0.82–1.77)

## 2018-06-09 ENCOUNTER — Encounter (INDEPENDENT_AMBULATORY_CARE_PROVIDER_SITE_OTHER): Payer: Self-pay | Admitting: Family Medicine

## 2018-06-09 ENCOUNTER — Other Ambulatory Visit: Payer: BC Managed Care – PPO

## 2018-06-10 ENCOUNTER — Other Ambulatory Visit: Payer: BC Managed Care – PPO

## 2018-06-23 ENCOUNTER — Encounter (INDEPENDENT_AMBULATORY_CARE_PROVIDER_SITE_OTHER): Payer: Self-pay | Admitting: Family Medicine

## 2018-06-23 ENCOUNTER — Ambulatory Visit (INDEPENDENT_AMBULATORY_CARE_PROVIDER_SITE_OTHER): Payer: BC Managed Care – PPO | Admitting: Family Medicine

## 2018-06-23 VITALS — BP 129/87 | HR 102 | Temp 98.5°F | Ht 64.0 in | Wt 290.0 lb

## 2018-06-23 DIAGNOSIS — E559 Vitamin D deficiency, unspecified: Secondary | ICD-10-CM | POA: Diagnosis not present

## 2018-06-23 DIAGNOSIS — E8881 Metabolic syndrome: Secondary | ICD-10-CM

## 2018-06-23 DIAGNOSIS — Z6841 Body Mass Index (BMI) 40.0 and over, adult: Secondary | ICD-10-CM

## 2018-06-23 DIAGNOSIS — Z9189 Other specified personal risk factors, not elsewhere classified: Secondary | ICD-10-CM

## 2018-06-23 MED ORDER — VITAMIN D (ERGOCALCIFEROL) 1.25 MG (50000 UNIT) PO CAPS: 50000.0000 [IU] | ORAL_CAPSULE | ORAL | 0 refills | Status: DC

## 2018-06-24 LAB — HEMOGLOBIN A1C
ESTIMATED AVERAGE GLUCOSE: 126 mg/dL
HEMOGLOBIN A1C: 6 % — AB (ref 4.8–5.6)

## 2018-06-24 NOTE — Progress Notes (Signed)
Office: [phone]  /  Fax: [phone]   HPI:   Chief Complaint: OBESITY Pamela [name] is here to discuss her progress with her obesity treatment plan. She is on the Category 1 plan + 100 calories and is following her eating plan approximately 80-90 % of the time. She states she is water aerobics for 60 minutes 3 times per week. Pamela [name] is status post lap band. She struggled to eat all her food especially the protein with the lap band. She was able to eat approximately 50 % of her food.  Her weight is 290 lb (131.5 kg) today and has had a weight loss of 8 pounds over a period of 2 to 3 weeks since her last visit. She has lost 8 lbs since starting treatment with Pamela [name].  Vitamin D Deficiency Pamela [name] has a new diagnosis of vitamin D deficiency. She is not currently taking Vit D, not at goal. She notes fatigue and denies nausea, vomiting or muscle weakness.  Insulin Resistance Pamela [name] has a new diagnosis of insulin resistance based on her elevated fasting insulin level >5. Although Pamela [name] blood glucose readings are still under good control, insulin resistance puts her at greater risk of metabolic syndrome and diabetes. She notes decreased polyphagia on diet and denies hypoglycemia. She is not taking metformin currently and continues to work on diet and exercise to decrease risk of diabetes.  At risk for diabetes Pamela [name] is at higher than average risk for developing diabetes due to her obesity and insulin resistance. She currently denies polyuria or polydipsia.  ALLERGIES: No Known Allergies  MEDICATIONS: Current Outpatient Medications on File Prior to Visit  Medication Sig Dispense Refill  . albuterol (2.5 MG/3ML) 0.083% NEBU 3 mL, albuterol (5 MG/ML) 0.5% NEBU 0.5 mL Inhale 3 mg into the lungs as needed.    . citalopram (CELEXA) 20 MG tablet Take 20 mg by mouth daily.    . clobetasol (TEMOVATE) 0.05 % external solution Apply 1 application topically 2 (two) times daily.    .  Fluticasone-Salmeterol (WIXELA INHUB) 500-50 MCG/DOSE AEPB Inhale 1 puff into the lungs 2 (two) times daily.    [name]. levothyroxine (SYNTHROID, LEVOTHROID) 150 MCG tablet Take 1 tablet (150 mcg total) by mouth daily before breakfast. 30 tablet 2  . modafinil (PROVIGIL) 200 MG tablet Take 200 mg by mouth daily.    [name]. omeprazole (PRILOSEC) 40 MG capsule Take 40 mg by mouth daily.    [name]. PROAIR RESPICLICK 108 (90 Base) MCG/ACT AEPB INHALE 1 PUFF BY MOUTH( INTO THE LUNGS) AS NEEDED 1 each 3  . umeclidinium bromide (INCRUSE ELLIPTA) 62.5 MCG/INH AEPB Inhale 1 puff into the lungs daily. 30 each 5  . zolpidem (AMBIEN) 10 MG tablet Take 10 mg by mouth at bedtime as needed for sleep.     No current facility-administered medications on file prior to visit.     PAST MEDICAL HISTORY: Past Medical History:  Diagnosis Date  . Alcohol abuse   . Anemia   . Asthma   . Blood in stool   . Colon polyps   . COPD (chronic obstructive pulmonary disease) (HCC)   . Depression   . Depression   . GERD (gastroesophageal reflux disease)   . Heart murmur   . History of alcohol abuse   . History of blood transfusion 2006  . HLD (hyperlipidemia)   . Obesity   . Sleep apnea   . Thyroid disease     PAST SURGICAL HISTORY: Past Surgical History:  Procedure Laterality Date  .  AUGMENTATION MAMMAPLASTY    . BREAST BIOPSY    . LAPAROSCOPIC GASTRIC BANDING  2010    SOCIAL HISTORY: Social History   Tobacco Use  . Smoking status: Former Games developer  . Smokeless tobacco: Never Used  Substance Use Topics  . Alcohol use: No    Frequency: Never  . Drug use: No    FAMILY HISTORY: Family History  Problem Relation Age of Onset  . Alcohol abuse Mother   . Depression Mother   . Early death Mother   . Liver disease Mother   . Alcohol abuse Father   . Obesity Father   . Cancer Father     ROS: Review of Systems  Constitutional: Positive for malaise/fatigue and weight loss.  Gastrointestinal: Negative for nausea and  vomiting.  Genitourinary: Negative for frequency.  Musculoskeletal:       Negative muscle weakness  Endo/Heme/Allergies: Negative for polydipsia.       Positive polyphagia Negative hypoglycemia    PHYSICAL EXAM: Blood pressure 129/87, pulse (!) 102, temperature 98.5 F (36.9 C), temperature source Oral, height 5\' 4"  (1.626 m), weight 290 lb (131.5 kg), SpO2 92 %. Body mass index is 49.78 kg/m. Physical Exam  Constitutional: She is oriented to person, place, and time. She appears well-developed and well-nourished.  Cardiovascular: Normal rate.  Pulmonary/Chest: Effort normal.  Musculoskeletal: Normal range of motion.  Neurological: She is oriented to person, place, and time.  Skin: Skin is warm and dry.  Psychiatric: She has a normal mood and affect. Her behavior is normal.  Vitals reviewed.   RECENT LABS AND TESTS: BMET    Component Value Date/Time   NA 138 06/05/2018 0937   K 4.7 06/05/2018 0937   CL 99 06/05/2018 0937   CO2 23 06/05/2018 0937   GLUCOSE 99 06/05/2018 0937   GLUCOSE 108 (H) 05/23/2018 0853   BUN 10 06/05/2018 0937   CREATININE 0.58 06/05/2018 0937   CALCIUM 9.2 06/05/2018 0937   GFRNONAA 103 06/05/2018 0937   GFRAA 118 06/05/2018 0937   Lab Results  Component Value Date   HGBA1C 6.0 (H) 06/23/2018   Lab Results  Component Value Date   INSULIN 10.9 06/05/2018   CBC    Component Value Date/Time   WBC 6.2 06/05/2018 0937   RBC 5.23 06/05/2018 0937   HGB 15.2 06/05/2018 0937   HCT 45.3 06/05/2018 0937   MCV 87 06/05/2018 0937   MCH 29.1 06/05/2018 0937   MCHC 33.6 06/05/2018 0937   RDW 15.2 06/05/2018 0937   LYMPHSABS 2.1 06/05/2018 0937   EOSABS 0.1 06/05/2018 0937   BASOSABS 0.1 06/05/2018 0937   Iron/TIBC/Ferritin/ %Sat No results found for: IRON, TIBC, FERRITIN, IRONPCTSAT Lipid Panel     Component Value Date/Time   CHOL 253 (H) 05/23/2018 0853   TRIG 83.0 05/23/2018 0853   HDL 51.50 05/23/2018 0853   CHOLHDL 5 05/23/2018 0853     VLDL 16.6 05/23/2018 0853   LDLCALC 185 (H) 05/23/2018 0853   Hepatic Function Panel     Component Value Date/Time   PROT 6.8 06/05/2018 0937   ALBUMIN 4.1 06/05/2018 0937   AST 18 06/05/2018 0937   ALT 20 06/05/2018 0937   ALKPHOS 89 06/05/2018 0937   BILITOT 0.3 06/05/2018 0937      Component Value Date/Time   TSH 4.450 06/05/2018 0937   TSH 3.47 01/27/2018 1143  Results for LILLYONA, POLASEK ANN (MRN [id number]) as of 06/24/2018 14:44  Ref. Range 06/05/2018 09:37  Vitamin D, 25-Hydroxy Latest  Ref Range: 30.0 - 100.0 ng/mL 18.6 (L)    ASSESSMENT AND PLAN: Vitamin D deficiency - Plan: Vitamin D, Ergocalciferol, ([name]) 50000 units CAPS capsule  Insulin resistance - Plan: Hemoglobin A1c  At risk for diabetes mellitus  Class 3 severe obesity with serious comorbidity and body mass index (BMI) of 45.0 to 49.9 in adult, unspecified obesity type (HCC)  PLAN:  Vitamin D Deficiency Pamela [name] was informed that low vitamin D levels contributes to fatigue and are associated with obesity, breast, and colon cancer. Pamela [name] agrees to start prescription Vit D @50 ,000 IU every week #4 with no refills. She will follow up for routine testing of vitamin D, at least 2-3 times per year. She was informed of the risk of over-replacement of vitamin D and agrees to not increase her dose unless she discusses this with [name] first. Pamela [name] agrees to follow up with our clinic in 2 weeks.  Insulin Resistance Pamela [name] will continue to work on weight loss, diet, exercise, and decreasing simple carbohydrates in her diet to help decrease the risk of diabetes. We dicussed metformin including benefits and risks. She was informed that eating too many simple carbohydrates or too many calories at one sitting increases the likelihood of GI side effects. Yvonne declined metformin for now and prescription was not written today. Pamela [name] agrees to follow up with our clinic in 2 weeks as directed to monitor her  progress.  Diabetes risk counselling Pamela [name] was given extended (30 minutes) diabetes prevention counseling today. She is 57 y.o. female and has risk factors for diabetes including obesity and insulin resistance. We discussed intensive lifestyle modifications today with an emphasis on weight loss as well as increasing exercise and decreasing simple carbohydrates in her diet.  Obesity Pamela [name] is currently in the action stage of change. As such, her goal is to continue with weight loss efforts She has agreed to change to keep a food journal with 1200-1400 calories and 75+ grams of protein daily Pamela [name] has been instructed to work up to a goal of 150 minutes of combined cardio and strengthening exercise per week for weight loss and overall health benefits. We discussed the following Behavioral Modification Strategies today: increasing lean protein intake and decreasing simple carbohydrates    Pamela [name] has agreed to follow up with our clinic in 2 weeks. She was informed of the importance of frequent follow up visits to maximize her success with intensive lifestyle modifications for her multiple health conditions.   OBESITY BEHAVIORAL INTERVENTION VISIT  Today's visit was # 2 out of 22.  Starting weight: 298 lbs Starting date: 06/05/18 Today's weight : 290 lbs  Today's date: 06/23/2018 Total lbs lost to date: 8    ASK: We discussed the diagnosis of obesity with Pamela Ohara today and Leilani agreed to give [name] permission to discuss obesity behavioral modification therapy today.  ASSESS: Kalee has the diagnosis of obesity and her BMI today is 49.75 Tiwanda is in the action stage of change   ADVISE: Chelcy was educated on the multiple health risks of obesity as well as the benefit of weight loss to improve her health. She was advised of the need for long term treatment and the importance of lifestyle modifications.  AGREE: Multiple dietary modification options and treatment  options were discussed and  Davene agreed to the above obesity treatment plan.  I, Burt Knack, am acting as transcriptionist for Quillian Quince, MD  I have reviewed the above documentation for accuracy and completeness, and I agree with the  above. -Dennard Nip, MD

## 2018-07-02 ENCOUNTER — Ambulatory Visit: Payer: BC Managed Care – PPO | Admitting: Family Medicine

## 2018-07-02 ENCOUNTER — Encounter: Payer: Self-pay | Admitting: Family Medicine

## 2018-07-02 VITALS — BP 126/84 | HR 96 | Temp 98.1°F | Ht 64.0 in | Wt 292.2 lb

## 2018-07-02 DIAGNOSIS — J0100 Acute maxillary sinusitis, unspecified: Secondary | ICD-10-CM

## 2018-07-02 DIAGNOSIS — L989 Disorder of the skin and subcutaneous tissue, unspecified: Secondary | ICD-10-CM | POA: Diagnosis not present

## 2018-07-02 DIAGNOSIS — B354 Tinea corporis: Secondary | ICD-10-CM

## 2018-07-02 MED ORDER — KETOCONAZOLE 2 % EX CREA: TOPICAL_CREAM | CUTANEOUS | 0 refills | Status: DC

## 2018-07-02 MED ORDER — METHYLPREDNISOLONE ACETATE 80 MG/ML IJ SUSP
80.0000 mg | Freq: Once | INTRAMUSCULAR | Status: AC
  Administered 2018-07-02: 80 mg via INTRAMUSCULAR

## 2018-07-02 NOTE — Progress Notes (Signed)
Chief Complaint  Patient presents with  . Sinusitis  . Headache    Pamela [name] here for URI complaints.  Duration: 2 days  Associated symptoms: sinus pain, ear fullness Denies: sinus congestion, rhinorrhea, itchy watery eyes, ear pain, ear drainage, sore throat, myalgia, dental pain and fevers Treatment to date: ibuprofen Sick contacts: Yes   She also continues to have issues with itchy skin on her hands/forearms.  The cream we gave her, steroid cream, has been helpful, however continues to flare.  She does use scented products, however no change recently.  She also uses dermasil for emollient. There is a different appearing lesion on her LUE and LLE that she was told looks like ring worm, but is not. She has never tried an antifungal. The steroid cream did not help with this.   ROS:  Const: Denies fevers HEENT: As noted in HPI Lungs: No SOB  Past Medical History:  Diagnosis Date  . Alcohol abuse   . Anemia   . Asthma   . Blood in stool   . Colon polyps   . COPD (chronic obstructive pulmonary disease) (HCC)   . Depression   . Depression   . GERD (gastroesophageal reflux disease)   . Heart murmur   . History of alcohol abuse   . History of blood transfusion 2006  . HLD (hyperlipidemia)   . Obesity   . Sleep apnea   . Thyroid disease      BP 126/84 (BP Location: Right Wrist, Patient Position: Sitting, Cuff Size: Normal)   Pulse 96   Temp 98.1 F (36.7 C) (Oral)   Ht 5\' 4"  (1.626 m)   Wt 292 lb 4 oz (132.6 kg)   SpO2 95%   BMI 50.16 kg/m  General: Awake, alert, appears stated age HEENT: AT, [place], ears patent b/l and TM's neg, nares patent w/o discharge, +TTP over max sinuses b/l, pharynx pink and without exudates, MMM Neck: No masses or asymmetry Heart: RRR Lungs: CTAB, no accessory muscle use Skin: +eczema on dorsum of b/l hands/forearms, there is a scaly/excoriated patch/ring with central clearing on LUE and LLE Psych: Age appropriate judgment and insight,  normal mood and affect  Acute non-recurrent maxillary sinusitis - Plan: methylPREDNISolone acetate (DEPO-MEDROL) injection 80 mg  Skin lesion  Tinea corporis - Plan: ketoconazole (NIZORAL) 2 % cream  Orders as above. INCS. Cont steroid cream. Dermasil twice daily, wear barrier like gloves at night. Trial antifungal for different lesion. If no improvement, will biopsy.  F/u prn. If starting to experience fevers, shaking, or shortness of breath, seek immediate care. Pt voiced understanding and agreement to the plan.  Jilda Rocheicholas Paul RaymondWendling, DO 07/02/18 12:49 PM

## 2018-07-02 NOTE — Progress Notes (Signed)
Pre visit review using our clinic review tool, if applicable. No additional management support is needed unless otherwise documented below in the visit note. 

## 2018-07-02 NOTE — Patient Instructions (Signed)
If you start having fevers, shaking or shortness of breath, seek immediate care.  OK to take Tylenol 1000 mg (2 extra strength tabs) or 975 mg (3 regular strength tabs) every 6 hours as needed.  Ice/cold pack over area for 10-15 min twice daily.  Try the prescription over the circular lesions on your L arm and L leg.  Wear the Dermacil on your hands and forearms at night. Put gloves on over this to help retain moisture.   Let us know if you need anything.

## 2018-07-04 ENCOUNTER — Telehealth: Payer: Self-pay | Admitting: Family Medicine

## 2018-07-04 MED ORDER — PSEUDOEPHEDRINE HCL 30 MG PO TABS: 30.0000 mg | ORAL_TABLET | Freq: Four times a day (QID) | ORAL | 0 refills | Status: DC | PRN

## 2018-07-04 NOTE — Telephone Encounter (Signed)
Copied from CRM [id number]. Topic: Quick Communication - See Telephone Encounter >> Jul 04, 2018 10:06 AM Terisa Starraylor, Brittany L wrote: CRM for notification. See Telephone encounter for: 07/04/18.  Patient states she was in the office on wednesday 8/7 and was given a shot. Patient states she is not feeling any better. The headache is gone but the sinus issue is a mess for her. She wants to know what she can take for this or can something be called in for that? Please advise, call back# [phone] ext [phone]  [id number] - [place], [place] - [address] [place] [phone]

## 2018-07-04 NOTE — Telephone Encounter (Signed)
Called informed the patient script sent in. 

## 2018-07-04 NOTE — Telephone Encounter (Signed)
Called in a decongestant to use as needed for her congestion. TY.

## 2018-07-15 ENCOUNTER — Ambulatory Visit (INDEPENDENT_AMBULATORY_CARE_PROVIDER_SITE_OTHER): Payer: BC Managed Care – PPO | Admitting: Physician Assistant

## 2018-07-15 VITALS — BP 136/82 | HR 93 | Temp 98.0°F | Ht 64.0 in | Wt 288.0 lb

## 2018-07-15 DIAGNOSIS — E559 Vitamin D deficiency, unspecified: Secondary | ICD-10-CM | POA: Diagnosis not present

## 2018-07-15 DIAGNOSIS — R7303 Prediabetes: Secondary | ICD-10-CM

## 2018-07-15 DIAGNOSIS — Z9189 Other specified personal risk factors, not elsewhere classified: Secondary | ICD-10-CM | POA: Diagnosis not present

## 2018-07-15 DIAGNOSIS — Z6841 Body Mass Index (BMI) 40.0 and over, adult: Secondary | ICD-10-CM

## 2018-07-15 MED ORDER — VITAMIN D (ERGOCALCIFEROL) 1.25 MG (50000 UNIT) PO CAPS: 50000.0000 [IU] | ORAL_CAPSULE | ORAL | 0 refills | Status: DC

## 2018-07-15 NOTE — Progress Notes (Signed)
Office: [phone]  /  Fax: [phone]   HPI:   Chief Complaint: OBESITY Marketta is here to discuss her progress with her obesity treatment plan. She is on the keep a food journal with 1200-1400 calories and 75+ grams of protein daily and is following her eating plan approximately 70 % of the time. She states she is doing water aerobics for 60 minutes 3 times per week. Shaquisha did well with weight loss. She reports struggling with getting all of the protein and calories in but is working towards that daily.  Her weight is 288 lb (130.6 kg) today and has had a weight loss of 2 pounds over a period of 3 weeks since her last visit. She has lost 10 lbs since starting treatment with [name].  Vitamin D Deficiency Senya has a diagnosis of vitamin D deficiency. She is on prescription Vit D and denies nausea, vomiting or muscle weakness.  Pre-Diabetes Ixchel has a diagnosis of pre-diabetes based on her elevated Hgb A1c and was informed this puts her at greater risk of developing diabetes. She denies polyphagia or hypoglycemia. She is not taking metformin currently and continues to work on diet and exercise to decrease risk of diabetes.   At risk for diabetes Renne is at higher than average risk for developing diabetes due to her obesity and pre-diabetes. She currently denies polyuria or polydipsia.  ALLERGIES: No Known Allergies  MEDICATIONS: Current Outpatient Medications on File Prior to Visit  Medication Sig Dispense Refill  . albuterol (2.5 MG/3ML) 0.083% NEBU 3 mL, albuterol (5 MG/ML) 0.5% NEBU 0.5 mL Inhale 3 mg into the lungs as needed.    . citalopram (CELEXA) 20 MG tablet Take 20 mg by mouth daily.    . clobetasol (TEMOVATE) 0.05 % external solution Apply 1 application topically 2 (two) times daily.    . Fluticasone-Salmeterol (WIXELA INHUB) 500-50 MCG/DOSE AEPB Inhale 1 puff into the lungs 2 (two) times daily.    [name] ketoconazole (NIZORAL) 2 % cream Apply to area on L arm and L  leg daily for 14 days. 30 g 0  . levothyroxine (SYNTHROID, LEVOTHROID) 150 MCG tablet Take 1 tablet (150 mcg total) by mouth daily before breakfast. 30 tablet 2  . modafinil (PROVIGIL) 200 MG tablet Take 200 mg by mouth daily.    [name] omeprazole (PRILOSEC) 40 MG capsule Take 40 mg by mouth daily.    [name] PROAIR RESPICLICK 108 (90 Base) MCG/ACT AEPB INHALE 1 PUFF BY MOUTH( INTO THE LUNGS) AS NEEDED 1 each 3  . pseudoephedrine (SUDAFED) 30 MG tablet Take 1 tablet (30 mg total) by mouth every 6 (six) hours as needed for congestion. 20 tablet 0  . umeclidinium bromide (INCRUSE ELLIPTA) 62.5 MCG/INH AEPB Inhale 1 puff into the lungs daily. 30 each 5  . zolpidem (AMBIEN) 10 MG tablet Take 10 mg by mouth at bedtime as needed for sleep.     No current facility-administered medications on file prior to visit.     PAST MEDICAL HISTORY: Past Medical History:  Diagnosis Date  . Alcohol abuse   . Anemia   . Asthma   . Blood in stool   . Colon polyps   . COPD (chronic obstructive pulmonary disease) (HCC)   . Depression   . Depression   . GERD (gastroesophageal reflux disease)   . Heart murmur   . History of alcohol abuse   . History of blood transfusion 2006  . HLD (hyperlipidemia)   . Obesity   .  Sleep apnea   . Thyroid disease     PAST SURGICAL HISTORY: Past Surgical History:  Procedure Laterality Date  . AUGMENTATION MAMMAPLASTY    . BREAST BIOPSY    . LAPAROSCOPIC GASTRIC BANDING  2010    SOCIAL HISTORY: Social History   Tobacco Use  . Smoking status: Former Games developermoker  . Smokeless tobacco: Never Used  Substance Use Topics  . Alcohol use: No    Frequency: Never  . Drug use: No    FAMILY HISTORY: Family History  Problem Relation Age of Onset  . Alcohol abuse Mother   . Depression Mother   . Early death Mother   . Liver disease Mother   . Alcohol abuse Father   . Obesity Father   . Cancer Father     ROS: Review of Systems  Constitutional: Positive for weight loss.    Gastrointestinal: Negative for nausea and vomiting.  Genitourinary: Negative for frequency.  Musculoskeletal:       Negative muscle weakness  Endo/Heme/Allergies: Negative for polydipsia.       Negative polyphagia Negative hypoglycemia    PHYSICAL EXAM: Blood pressure 136/82, pulse 93, temperature 98 F (36.7 C), temperature source Oral, height 5\' 4"  (1.626 m), weight 288 lb (130.6 kg), SpO2 94 %. Body mass index is 49.44 kg/m. Physical Exam  Constitutional: She is oriented to person, place, and time. She appears well-developed and well-nourished.  Cardiovascular: Normal rate.  Pulmonary/Chest: Effort normal.  Musculoskeletal: Normal range of motion.  Neurological: She is oriented to person, place, and time.  Skin: Skin is warm and dry.  Psychiatric: She has a normal mood and affect. Her behavior is normal.  Vitals reviewed.   RECENT LABS AND TESTS: BMET    Component Value Date/Time   NA 138 06/05/2018 0937   K 4.7 06/05/2018 0937   CL 99 06/05/2018 0937   CO2 23 06/05/2018 0937   GLUCOSE 99 06/05/2018 0937   GLUCOSE 108 (H) 05/23/2018 0853   BUN 10 06/05/2018 0937   CREATININE 0.58 06/05/2018 0937   CALCIUM 9.2 06/05/2018 0937   GFRNONAA 103 06/05/2018 0937   GFRAA 118 06/05/2018 0937   Lab Results  Component Value Date   HGBA1C 6.0 (H) 06/23/2018   Lab Results  Component Value Date   INSULIN 10.9 06/05/2018   CBC    Component Value Date/Time   WBC 6.2 06/05/2018 0937   RBC 5.23 06/05/2018 0937   HGB 15.2 06/05/2018 0937   HCT 45.3 06/05/2018 0937   MCV 87 06/05/2018 0937   MCH 29.1 06/05/2018 0937   MCHC 33.6 06/05/2018 0937   RDW 15.2 06/05/2018 0937   LYMPHSABS 2.1 06/05/2018 0937   EOSABS 0.1 06/05/2018 0937   BASOSABS 0.1 06/05/2018 0937   Iron/TIBC/Ferritin/ %Sat No results found for: IRON, TIBC, FERRITIN, IRONPCTSAT Lipid Panel     Component Value Date/Time   CHOL 253 (H) 05/23/2018 0853   TRIG 83.0 05/23/2018 0853   HDL 51.50  05/23/2018 0853   CHOLHDL 5 05/23/2018 0853   VLDL 16.6 05/23/2018 0853   LDLCALC 185 (H) 05/23/2018 0853   Hepatic Function Panel     Component Value Date/Time   PROT 6.8 06/05/2018 0937   ALBUMIN 4.1 06/05/2018 0937   AST 18 06/05/2018 0937   ALT 20 06/05/2018 0937   ALKPHOS 89 06/05/2018 0937   BILITOT 0.3 06/05/2018 0937      Component Value Date/Time   TSH 4.450 06/05/2018 0937   TSH 3.47 01/27/2018 1143  Results  for Charlean SanfilippoAYLOR, [facility] (MRN [id number]) as of 07/15/2018 09:28  Ref. Range 06/05/2018 09:37  Vitamin D, 25-Hydroxy Latest Ref Range: 30.0 - 100.0 ng/mL 18.6 (L)    ASSESSMENT AND PLAN: Vitamin D deficiency - Plan: Vitamin D, Ergocalciferol, ([name]) 50000 units CAPS capsule  Prediabetes  At risk for diabetes mellitus  Class 3 severe obesity with serious comorbidity and body mass index (BMI) of 45.0 to 49.9 in adult, unspecified obesity type (HCC)  PLAN:  Vitamin D Deficiency [name] was informed that low vitamin D levels contributes to fatigue and are associated with obesity, breast, and colon cancer. Lanecia agrees to continue taking prescription Vit D @50 ,000 IU every week #4 and we will refill for 1 month. She will follow up for routine testing of vitamin D, at least 2-3 times per year. She was informed of the risk of over-replacement of vitamin D and agrees to not increase her dose unless she discusses this with us first. [name] agrees to follow up with our clinic in 2 to 3 weeks.  Pre-Diabetes [name] will continue to work on weight loss, diet, exercise, and decreasing simple carbohydrates in her diet to help decrease the risk of diabetes. We dicussed metformin including benefits and risks. She was informed that eating too many simple carbohydrates or too many calories at one sitting increases the likelihood of GI side effects. Morgen declined metformin for now and a prescription was not written today. Daly agrees to follow up with our clinic in 2 to 3  weeks as directed to monitor her progress.  Diabetes risk counselling [name] was given extended (15 minutes) diabetes prevention counseling today. She is 57 y.o. female and has risk factors for diabetes including obesity and pre-diabetes. We discussed intensive lifestyle modifications today with an emphasis on weight loss as well as increasing exercise and decreasing simple carbohydrates in her diet.  Obesity [name] is currently in the action stage of change. As such, her goal is to continue with weight loss efforts She has agreed to keep a food journal with 1200-1400 calories and 75+ grams of protein daily [name] has been instructed to work up to a goal of 150 minutes of combined cardio and strengthening exercise per week for weight loss and overall health benefits. We discussed the following Behavioral Modification Strategies today: work on meal planning and easy cooking plans and keeping healthy foods in the home   [name] has agreed to follow up with our clinic in 2 to 3 weeks. She was informed of the importance of frequent follow up visits to maximize her success with intensive lifestyle modifications for her multiple health conditions.   OBESITY BEHAVIORAL INTERVENTION VISIT  Today's visit was # 3 out of 22.  Starting weight: 298 lbs Starting date: 06/05/18 Today's weight : 288 lbs Today's date: 07/15/2018 Total lbs lost to date: 10    ASK: We discussed the diagnosis of obesity with Rita Oharaorothy Ann Taylor today and [name] agreed to give us permission to discuss obesity behavioral modification therapy today.  ASSESS: [name] has the diagnosis of obesity and her BMI today is 49.41 [name] is in the action stage of change   ADVISE: [name] was educated on the multiple health risks of obesity as well as the benefit of weight loss to improve her health. She was advised of the need for long term treatment and the importance of lifestyle modifications.  AGREE: Multiple dietary  modification options and treatment options were discussed and  Sabrine agreed to the above obesity treatment plan.  Wilhemena Durie, am acting as transcriptionist for Abby Potash, PA-C I, Abby Potash, PA-C have reviewed above note and agree with its content

## 2018-08-05 ENCOUNTER — Ambulatory Visit (INDEPENDENT_AMBULATORY_CARE_PROVIDER_SITE_OTHER): Payer: BC Managed Care – PPO | Admitting: Physician Assistant

## 2018-08-05 VITALS — BP 143/85 | HR 89 | Temp 97.3°F | Ht 64.0 in | Wt 285.0 lb

## 2018-08-05 DIAGNOSIS — Z6841 Body Mass Index (BMI) 40.0 and over, adult: Secondary | ICD-10-CM | POA: Diagnosis not present

## 2018-08-05 DIAGNOSIS — E559 Vitamin D deficiency, unspecified: Secondary | ICD-10-CM

## 2018-08-05 NOTE — Progress Notes (Signed)
Office: [phone]  /  Fax: [phone]   HPI:   Chief Complaint: OBESITY Pamela [name] is here to discuss her progress with her obesity treatment plan. She is on the keep a food journal with 1200-1400 calories and 75+ grams of protein daily and is following her eating plan approximately 65 % of the time. She states she is doing water aerobics for 60 minutes 3 times per week. Pamela [name] did well with weight loss. She reports skipping breakfast and then going to do water aerobics. She is not is not reaching her protein goal daily.  Her weight is 285 lb (129.3 kg) today and has had a weight loss of 3 pounds over a period of 3 weeks since her last visit. She has lost 13 lbs since starting treatment with [name].  Vitamin D Deficiency Pamela [name] has a diagnosis of vitamin D deficiency. She is on prescription Vit D and denies nausea, vomiting or muscle weakness.  ALLERGIES: No Known Allergies  MEDICATIONS: Current Outpatient Medications on File Prior to Visit  Medication Sig Dispense Refill  . albuterol (2.5 MG/3ML) 0.083% NEBU 3 mL, albuterol (5 MG/ML) 0.5% NEBU 0.5 mL Inhale 3 mg into the lungs as needed.    . citalopram (CELEXA) 20 MG tablet Take 20 mg by mouth daily.    . clobetasol (TEMOVATE) 0.05 % external solution Apply 1 application topically 2 (two) times daily.    . Fluticasone-Salmeterol (WIXELA INHUB) 500-50 MCG/DOSE AEPB Inhale 1 puff into the lungs 2 (two) times daily.    [name] ketoconazole (NIZORAL) 2 % cream Apply to area on L arm and L leg daily for 14 days. 30 g 0  . levothyroxine (SYNTHROID, LEVOTHROID) 150 MCG tablet Take 1 tablet (150 mcg total) by mouth daily before breakfast. 30 tablet 2  . modafinil (PROVIGIL) 200 MG tablet Take 200 mg by mouth daily.    [name] omeprazole (PRILOSEC) 40 MG capsule Take 40 mg by mouth daily.    [name] PROAIR RESPICLICK 108 (90 Base) MCG/ACT AEPB INHALE 1 PUFF BY MOUTH( INTO THE LUNGS) AS NEEDED 1 each 3  . pseudoephedrine (SUDAFED) 30 MG tablet Take 1 tablet  (30 mg total) by mouth every 6 (six) hours as needed for congestion. 20 tablet 0  . umeclidinium bromide (INCRUSE ELLIPTA) 62.5 MCG/INH AEPB Inhale 1 puff into the lungs daily. 30 each 5  . Vitamin D, Ergocalciferol, ([name]) 50000 units CAPS capsule Take 1 capsule (50,000 Units total) by mouth every 7 (seven) days. 4 capsule 0  . zolpidem (AMBIEN) 10 MG tablet Take 10 mg by mouth at bedtime as needed for sleep.     No current facility-administered medications on file prior to visit.     PAST MEDICAL HISTORY: Past Medical History:  Diagnosis Date  . Alcohol abuse   . Anemia   . Asthma   . Blood in stool   . Colon polyps   . COPD (chronic obstructive pulmonary disease) (HCC)   . Depression   . Depression   . GERD (gastroesophageal reflux disease)   . Heart murmur   . History of alcohol abuse   . History of blood transfusion 2006  . HLD (hyperlipidemia)   . Obesity   . Sleep apnea   . Thyroid disease     PAST SURGICAL HISTORY: Past Surgical History:  Procedure Laterality Date  . AUGMENTATION MAMMAPLASTY    . BREAST BIOPSY    . LAPAROSCOPIC GASTRIC BANDING  2010    SOCIAL HISTORY: Social History   Tobacco  Use  . Smoking status: Former Smoker  . Smokeless tobacco: Never Used  Substance Use Topics  . Alcohol use: No    Frequency: Never  . Drug use: No    FAMILY HISTORY: Family History  Problem Relation Age of Onset  . Alcohol abuse Mother   . Depression Mother   . Early death Mother   . Liver disease Mother   . Alcohol abuse Father   . Obesity Father   . Cancer Father     ROS: Review of Systems  Constitutional: Positive for weight loss.  Gastrointestinal: Negative for nausea and vomiting.  Musculoskeletal:       Negative muscle weakness    PHYSICAL EXAM: Blood pressure (!) 143/85, pulse 89, temperature (!) 97.3 F (36.3 C), temperature source Oral, height 5\' 4"  (1.626 m), weight 285 lb (129.3 kg), SpO2 95 %. Body mass index is 48.92 kg/m. Physical  Exam  Constitutional: She is oriented to person, place, and time. She appears well-developed and well-nourished.  Cardiovascular: Normal rate.  Pulmonary/Chest: Effort normal.  Musculoskeletal: Normal range of motion.  Neurological: She is oriented to person, place, and time.  Skin: Skin is warm and dry.  Psychiatric: She has a normal mood and affect. Her behavior is normal.  Vitals reviewed.   RECENT LABS AND TESTS: BMET    Component Value Date/Time   NA 138 06/05/2018 0937   K 4.7 06/05/2018 0937   CL 99 06/05/2018 0937   CO2 23 06/05/2018 0937   GLUCOSE 99 06/05/2018 0937   GLUCOSE 108 (H) 05/23/2018 0853   BUN 10 06/05/2018 0937   CREATININE 0.58 06/05/2018 0937   CALCIUM 9.2 06/05/2018 0937   GFRNONAA 103 06/05/2018 0937   GFRAA 118 06/05/2018 0937   Lab Results  Component Value Date   HGBA1C 6.0 (H) 06/23/2018   Lab Results  Component Value Date   INSULIN 10.9 06/05/2018   CBC    Component Value Date/Time   WBC 6.2 06/05/2018 0937   RBC 5.23 06/05/2018 0937   HGB 15.2 06/05/2018 0937   HCT 45.3 06/05/2018 0937   MCV 87 06/05/2018 0937   MCH 29.1 06/05/2018 0937   MCHC 33.6 06/05/2018 0937   RDW 15.2 06/05/2018 0937   LYMPHSABS 2.1 06/05/2018 0937   EOSABS 0.1 06/05/2018 0937   BASOSABS 0.1 06/05/2018 0937   Iron/TIBC/Ferritin/ %Sat No results found for: IRON, TIBC, FERRITIN, IRONPCTSAT Lipid Panel     Component Value Date/Time   CHOL 253 (H) 05/23/2018 0853   TRIG 83.0 05/23/2018 0853   HDL 51.50 05/23/2018 0853   CHOLHDL 5 05/23/2018 0853   VLDL 16.6 05/23/2018 0853   LDLCALC 185 (H) 05/23/2018 0853   Hepatic Function Panel     Component Value Date/Time   PROT 6.8 06/05/2018 0937   ALBUMIN 4.1 06/05/2018 0937   AST 18 06/05/2018 0937   ALT 20 06/05/2018 0937   ALKPHOS 89 06/05/2018 0937   BILITOT 0.3 06/05/2018 0937      Component Value Date/Time   TSH 4.450 06/05/2018 0937   TSH 3.47 01/27/2018 1143  Results for Pamela [name]  (MRN [id number]) as of 08/05/2018 13:53  Ref. Range 06/05/2018 09:37  Vitamin D, 25-Hydroxy Latest Ref Range: 30.0 - 100.0 ng/mL 18.6 (L)    ASSESSMENT AND PLAN: Vitamin D deficiency  Class 3 severe obesity with serious comorbidity and body mass index (BMI) of 45.0 to 49.9 in adult, unspecified obesity type (HCC)  PLAN:  Vitamin D Deficiency Terrye was informed  that low vitamin D levels contributes to fatigue and are associated with obesity, breast, and colon cancer. Dealva agrees to continue taking prescription Vit D @50 ,000 IU every week and will follow up for routine testing of vitamin D, at least 2-3 times per year. She was informed of the risk of over-replacement of vitamin D and agrees to not increase her dose unless she discusses this with [name] first. Pier agrees to follow up with our clinic in 3 weeks.  I spent > than 50% of the 15 minute visit on counseling as documented in the note.  Obesity Andreia is currently in the action stage of change. As such, her goal is to continue with weight loss efforts She has agreed to keep a food journal with 1200-1400 calories and 75+ grams of protein daily Kirti has been instructed to work up to a goal of 150 minutes of combined cardio and strengthening exercise per week for weight loss and overall health benefits. We discussed the following Behavioral Modification Strategies today: work on meal planning and easy cooking plans   Sunset has agreed to follow up with our clinic in 3 weeks. She was informed of the importance of frequent follow up visits to maximize her success with intensive lifestyle modifications for her multiple health conditions.   OBESITY BEHAVIORAL INTERVENTION VISIT  Today's visit was # 4  Starting weight: 298 lbs Starting date: 06/05/18 Today's weight : 285 lbs Today's date: 08/05/2018 Total lbs lost to date: 13    ASK: We discussed the diagnosis of obesity with Rita Ohara today and Damyiah agreed to  give [name] permission to discuss obesity behavioral modification therapy today.  ASSESS: Mandana has the diagnosis of obesity and her BMI today is 30.9 Annett is in the action stage of change   ADVISE: Terecia was educated on the multiple health risks of obesity as well as the benefit of weight loss to improve her health. She was advised of the need for long term treatment and the importance of lifestyle modifications.  AGREE: Multiple dietary modification options and treatment options were discussed and  Netha agreed to the above obesity treatment plan.  Trude Mcburney, am acting as transcriptionist for Alois Cliche, PA-C I, Alois Cliche, PA-C have reviewed above note and agree with its content

## 2018-08-20 ENCOUNTER — Other Ambulatory Visit: Payer: Self-pay | Admitting: Family Medicine

## 2018-08-20 ENCOUNTER — Encounter: Payer: Self-pay | Admitting: Family Medicine

## 2018-08-20 DIAGNOSIS — J449 Chronic obstructive pulmonary disease, unspecified: Secondary | ICD-10-CM

## 2018-08-28 ENCOUNTER — Ambulatory Visit (INDEPENDENT_AMBULATORY_CARE_PROVIDER_SITE_OTHER): Payer: BC Managed Care – PPO | Admitting: Physician Assistant

## 2018-08-28 ENCOUNTER — Encounter (INDEPENDENT_AMBULATORY_CARE_PROVIDER_SITE_OTHER): Payer: Self-pay | Admitting: Physician Assistant

## 2018-08-28 VITALS — BP 113/82 | HR 77 | Temp 98.3°F | Ht 64.0 in | Wt 283.0 lb

## 2018-08-28 DIAGNOSIS — Z6841 Body Mass Index (BMI) 40.0 and over, adult: Secondary | ICD-10-CM

## 2018-08-28 DIAGNOSIS — Z9189 Other specified personal risk factors, not elsewhere classified: Secondary | ICD-10-CM | POA: Diagnosis not present

## 2018-08-28 DIAGNOSIS — E559 Vitamin D deficiency, unspecified: Secondary | ICD-10-CM

## 2018-08-28 DIAGNOSIS — R7303 Prediabetes: Secondary | ICD-10-CM | POA: Diagnosis not present

## 2018-08-28 MED ORDER — VITAMIN D (ERGOCALCIFEROL) 1.25 MG (50000 UNIT) PO CAPS: 50000.0000 [IU] | ORAL_CAPSULE | ORAL | 0 refills | Status: DC

## 2018-08-28 NOTE — Progress Notes (Signed)
Office: [phone]  /  Fax: [phone]   HPI:   Chief Complaint: OBESITY Pamela [name] is here to discuss her progress with her obesity treatment plan. She is keeping a food journal with 1300 to 1400 calories and 75+ grams of protein and is following her eating plan approximately 80 % of the time. She states she is doing water aerobics 60 minutes 3 times per week. [name] did well with weight loss. She reports having some chocolate cravings recently after eating bagels. She is asking about breakfast options.  Her weight is 283 lb (128.4 kg) today and has had a weight loss of 2 pounds over a period of 3 weeks since her last visit. She has lost 15 lbs since starting treatment with [name].  Vitamin D deficiency Pamela [name] has a diagnosis of vitamin D deficiency. She is currently taking vit D and denies nausea, vomiting or muscle weakness.  Pre-Diabetes Pamela [name] has a diagnosis of pre-diabetes based on her elevated Hgb A1c and was informed this puts her at greater risk of developing diabetes. She is not taking medications currently and continues to work on diet and exercise to decrease risk of diabetes. She denies polyphagia.  At risk for osteopenia and osteoporosis Pamela [name] is at higher risk of osteopenia and osteoporosis due to vitamin D deficiency.   ALLERGIES: No Known Allergies  MEDICATIONS: Current Outpatient Medications on File Prior to Visit  Medication Sig Dispense Refill  . albuterol (2.5 MG/3ML) 0.083% NEBU 3 mL, albuterol (5 MG/ML) 0.5% NEBU 0.5 mL Inhale 3 mg into the lungs as needed.    . citalopram (CELEXA) 20 MG tablet Take 20 mg by mouth daily.    . clobetasol (TEMOVATE) 0.05 % external solution Apply 1 application topically 2 (two) times daily.    . Fluticasone-Salmeterol (WIXELA INHUB) 500-50 MCG/DOSE AEPB Inhale 1 puff into the lungs 2 (two) times daily.    [name] ketoconazole (NIZORAL) 2 % cream Apply to area on L arm and L leg daily for 14 days. 30 g 0  . levothyroxine (SYNTHROID,  LEVOTHROID) 150 MCG tablet Take 1 tablet (150 mcg total) by mouth daily before breakfast. 30 tablet 2  . modafinil (PROVIGIL) 200 MG tablet Take 200 mg by mouth daily.    [name] omeprazole (PRILOSEC) 40 MG capsule Take 40 mg by mouth daily.    [name] PROAIR RESPICLICK 108 (90 Base) MCG/ACT AEPB INHALE 1 PUFF BY MOUTH( INTO THE LUNGS) AS NEEDED 1 each 3  . pseudoephedrine (SUDAFED) 30 MG tablet Take 1 tablet (30 mg total) by mouth every 6 (six) hours as needed for congestion. 20 tablet 0  . umeclidinium bromide (INCRUSE ELLIPTA) 62.5 MCG/INH AEPB Inhale 1 puff into the lungs daily. 30 each 5  . zolpidem (AMBIEN) 10 MG tablet Take 10 mg by mouth at bedtime as needed for sleep.     No current facility-administered medications on file prior to visit.     PAST MEDICAL HISTORY: Past Medical History:  Diagnosis Date  . Alcohol abuse   . Anemia   . Asthma   . Blood in stool   . Colon polyps   . COPD (chronic obstructive pulmonary disease) (HCC)   . Depression   . Depression   . GERD (gastroesophageal reflux disease)   . Heart murmur   . History of alcohol abuse   . History of blood transfusion 2006  . HLD (hyperlipidemia)   . Obesity   . Sleep apnea   . Thyroid disease     PAST  SURGICAL HISTORY: Past Surgical History:  Procedure Laterality Date  . AUGMENTATION MAMMAPLASTY    . BREAST BIOPSY    . LAPAROSCOPIC GASTRIC BANDING  2010    SOCIAL HISTORY: Social History   Tobacco Use  . Smoking status: Former Games developer  . Smokeless tobacco: Never Used  Substance Use Topics  . Alcohol use: No    Frequency: Never  . Drug use: No    FAMILY HISTORY: Family History  Problem Relation Age of Onset  . Alcohol abuse Mother   . Depression Mother   . Early death Mother   . Liver disease Mother   . Alcohol abuse Father   . Obesity Father   . Cancer Father     ROS: Review of Systems  Constitutional: Positive for weight loss.  Gastrointestinal: Negative for nausea and vomiting.    Musculoskeletal:       Negative for muscle weakness,  Endo/Heme/Allergies:       Negative for polyphagia.    PHYSICAL EXAM: Blood pressure 113/82, pulse 77, temperature 98.3 F (36.8 C), temperature source Oral, height 5\' 4"  (1.626 m), weight 283 lb (128.4 kg), SpO2 94 %. Body mass index is 48.58 kg/m. Physical Exam  Constitutional: She is oriented to person, place, and time. She appears well-developed and well-nourished.  Cardiovascular: Normal rate.  Pulmonary/Chest: Effort normal.  Musculoskeletal: Normal range of motion.  Neurological: She is oriented to person, place, and time.  Skin: Skin is warm and dry.  Psychiatric: She has a normal mood and affect. Her behavior is normal.  Vitals reviewed.   RECENT LABS AND TESTS: BMET    Component Value Date/Time   NA 138 06/05/2018 0937   K 4.7 06/05/2018 0937   CL 99 06/05/2018 0937   CO2 23 06/05/2018 0937   GLUCOSE 99 06/05/2018 0937   GLUCOSE 108 (H) 05/23/2018 0853   BUN 10 06/05/2018 0937   CREATININE 0.58 06/05/2018 0937   CALCIUM 9.2 06/05/2018 0937   GFRNONAA 103 06/05/2018 0937   GFRAA 118 06/05/2018 0937   Lab Results  Component Value Date   HGBA1C 6.0 (H) 06/23/2018   Lab Results  Component Value Date   INSULIN 10.9 06/05/2018   CBC    Component Value Date/Time   WBC 6.2 06/05/2018 0937   RBC 5.23 06/05/2018 0937   HGB 15.2 06/05/2018 0937   HCT 45.3 06/05/2018 0937   MCV 87 06/05/2018 0937   MCH 29.1 06/05/2018 0937   MCHC 33.6 06/05/2018 0937   RDW 15.2 06/05/2018 0937   LYMPHSABS 2.1 06/05/2018 0937   EOSABS 0.1 06/05/2018 0937   BASOSABS 0.1 06/05/2018 0937   Iron/TIBC/Ferritin/ %Sat No results found for: IRON, TIBC, FERRITIN, IRONPCTSAT Lipid Panel     Component Value Date/Time   CHOL 253 (H) 05/23/2018 0853   TRIG 83.0 05/23/2018 0853   HDL 51.50 05/23/2018 0853   CHOLHDL 5 05/23/2018 0853   VLDL 16.6 05/23/2018 0853   LDLCALC 185 (H) 05/23/2018 0853   Hepatic Function Panel      Component Value Date/Time   PROT 6.8 06/05/2018 0937   ALBUMIN 4.1 06/05/2018 0937   AST 18 06/05/2018 0937   ALT 20 06/05/2018 0937   ALKPHOS 89 06/05/2018 0937   BILITOT 0.3 06/05/2018 0937      Component Value Date/Time   TSH 4.450 06/05/2018 0937   TSH 3.47 01/27/2018 1143   Results for ROMELLE, MULDOON ANN (MRN [id number]) as of 08/28/2018 14:35  Ref. Range 06/05/2018 09:37  Vitamin D, 25-Hydroxy  Latest Ref Range: 30.0 - 100.0 ng/mL 18.6 (L)   ASSESSMENT AND PLAN: Vitamin D deficiency - Plan: Vitamin D, Ergocalciferol, ([name]) 50000 units CAPS capsule  Prediabetes  At risk for osteoporosis  Class 3 severe obesity with serious comorbidity and body mass index (BMI) of 45.0 to 49.9 in adult, unspecified obesity type (HCC)  PLAN:  Vitamin D Deficiency Dilia was informed that low vitamin D levels contributes to fatigue and are associated with obesity, breast, and colon cancer. She agrees to continue to take prescription Vit D @50 ,000 IU every week #4 with no refills and will follow up for routine testing of vitamin D, at least 2-3 times per year. She was informed of the risk of over-replacement of vitamin D and agrees to not increase her dose unless she discusses this with [name] first. Remmie agrees to follow up in 2 to 3 weeks.  Pre-Diabetes Kristiane will continue to work on weight loss, exercise, and decreasing simple carbohydrates in her diet to help decrease the risk of diabetes. She was informed that eating too many simple carbohydrates or too many calories at one sitting increases the likelihood of GI side effects. She agreed to continue with weight loss and exercise. Lataunya agreed to follow up with [name] as directed to monitor her progress.  At risk for osteopenia and osteoporosis Catelin was given extended (15 minutes) osteoporosis prevention counseling today. Khadijah is at risk for osteopenia and osteoporosis due to her vitamin D deficiency. She was encouraged to take her  vitamin D and follow her higher calcium diet and increase strengthening exercise to help strengthen her bones and decrease her risk of osteopenia and osteoporosis.  Obesity Saranya is currently in the action stage of change. As such, her goal is to continue with weight loss efforts. She has agreed to keep a food journal with 1300 to 1400 calories and 75 grams of protein daily. Desare has been instructed to work up to a goal of 150 minutes of combined cardio and strengthening exercise per week for weight loss and overall health benefits. We discussed the following Behavioral Modification Strategies today: work on meal planning and easy cooking plans and keep a strict food journal.  Averie has agreed to follow up with our clinic in 2 to 3 weeks. She was informed of the importance of frequent follow up visits to maximize her success with intensive lifestyle modifications for her multiple health conditions.   OBESITY BEHAVIORAL INTERVENTION VISIT  Today's visit was # 5   Starting weight: 298 lbs Starting date: 06/05/18 Today's weight : Weight: 283 lb (128.4 kg)  Today's date: 08/28/2018 Total lbs lost to date: 15  ASK: We discussed the diagnosis of obesity with Rita Ohara today and Tawnya agreed to give [name] permission to discuss obesity behavioral modification therapy today.  ASSESS: Samariyah has the diagnosis of obesity and her BMI today is 48.55. Omah is in the action stage of change.   ADVISE: Shaliyah was educated on the multiple health risks of obesity as well as the benefit of weight loss to improve her health. She was advised of the need for long term treatment and the importance of lifestyle modifications to improve her current health and to decrease her risk of future health problems.  AGREE: Multiple dietary modification options and treatment options were discussed and Masaye agreed to follow the recommendations documented in the above note.  ARRANGE: Zalea was  educated on the importance of frequent visits to treat obesity as outlined per CMS and  USPSTF guidelines and agreed to schedule her next follow up appointment today.  Launa Flight, am acting as transcriptionist for Alois Cliche, PA-C I, Alois Cliche, PA-C have reviewed above note and agree with its content

## 2018-09-25 ENCOUNTER — Encounter (INDEPENDENT_AMBULATORY_CARE_PROVIDER_SITE_OTHER): Payer: Self-pay | Admitting: Physician Assistant

## 2018-09-25 ENCOUNTER — Ambulatory Visit (INDEPENDENT_AMBULATORY_CARE_PROVIDER_SITE_OTHER): Payer: BC Managed Care – PPO | Admitting: Physician Assistant

## 2018-09-25 VITALS — BP 126/86 | HR 93 | Temp 97.7°F | Ht 64.0 in | Wt 281.0 lb

## 2018-09-25 DIAGNOSIS — E559 Vitamin D deficiency, unspecified: Secondary | ICD-10-CM | POA: Diagnosis not present

## 2018-09-25 DIAGNOSIS — E7849 Other hyperlipidemia: Secondary | ICD-10-CM | POA: Diagnosis not present

## 2018-09-25 DIAGNOSIS — Z6841 Body Mass Index (BMI) 40.0 and over, adult: Secondary | ICD-10-CM

## 2018-09-25 DIAGNOSIS — Z9189 Other specified personal risk factors, not elsewhere classified: Secondary | ICD-10-CM | POA: Diagnosis not present

## 2018-09-25 DIAGNOSIS — R7303 Prediabetes: Secondary | ICD-10-CM

## 2018-09-25 MED ORDER — VITAMIN D (ERGOCALCIFEROL) 1.25 MG (50000 UNIT) PO CAPS: 50000.0000 [IU] | ORAL_CAPSULE | ORAL | 0 refills | Status: DC

## 2018-09-25 NOTE — Progress Notes (Signed)
Office: [phone]  /  Fax: [phone]   HPI:   Chief Complaint: OBESITY Pamela [name] is here to discuss her progress with her obesity treatment plan. She is on the keep a food journal with 1300-1400 calories and 75 grams of protein daily and is following her eating plan approximately 75 % of the time. She states she is water aerobics for 60 minutes 3 times per week. Pamela [name] did very well with weight loss. She reports that she is not getting all of her protein in throughout the day on average.  Her weight is 281 lb (127.5 kg) today and has had a weight loss of 2 pounds over a period of 4 weeks since her last visit. She has lost 17 lbs since starting treatment with [name].  Vitamin D Deficiency Pamela [name] has a diagnosis of vitamin D deficiency. She is currently taking prescription Vit D and denies nausea, vomiting or muscle weakness.  At risk for osteopenia and osteoporosis Pamela [name] is at higher risk of osteopenia and osteoporosis due to vitamin D deficiency.   Pre-Diabetes Pamela [name] has a diagnosis of pre-diabetes based on her elevated Hgb A1c and was informed this puts her at greater risk of developing diabetes. She is not taking metformin currently and continues to work on diet and exercise to decrease risk of diabetes. She denies polyphagia or hypoglycemia.  Hyperlipidemia Pamela [name] has hyperlipidemia and has been trying to improve her cholesterol levels with intensive lifestyle modification including a low saturated fat diet, exercise and weight loss. She is not on medications and denies any chest pain, claudication or myalgias.  ALLERGIES: No Known Allergies  MEDICATIONS: Current Outpatient Medications on File Prior to Visit  Medication Sig Dispense Refill  . albuterol (2.5 MG/3ML) 0.083% NEBU 3 mL, albuterol (5 MG/ML) 0.5% NEBU 0.5 mL Inhale 3 mg into the lungs as needed.    . citalopram (CELEXA) 20 MG tablet Take 20 mg by mouth daily.    . clobetasol (TEMOVATE) 0.05 % external solution  Apply 1 application topically 2 (two) times daily.    . Fluticasone-Salmeterol (WIXELA INHUB) 500-50 MCG/DOSE AEPB Inhale 1 puff into the lungs 2 (two) times daily.    [name] ketoconazole (NIZORAL) 2 % cream Apply to area on L arm and L leg daily for 14 days. 30 g 0  . levothyroxine (SYNTHROID, LEVOTHROID) 150 MCG tablet Take 1 tablet (150 mcg total) by mouth daily before breakfast. 30 tablet 2  . modafinil (PROVIGIL) 200 MG tablet Take 200 mg by mouth daily.    [name] omeprazole (PRILOSEC) 40 MG capsule Take 40 mg by mouth daily.    [name] PROAIR RESPICLICK 108 (90 Base) MCG/ACT AEPB INHALE 1 PUFF BY MOUTH( INTO THE LUNGS) AS NEEDED 1 each 3  . pseudoephedrine (SUDAFED) 30 MG tablet Take 1 tablet (30 mg total) by mouth every 6 (six) hours as needed for congestion. 20 tablet 0  . umeclidinium bromide (INCRUSE ELLIPTA) 62.5 MCG/INH AEPB Inhale 1 puff into the lungs daily. 30 each 5  . zolpidem (AMBIEN) 10 MG tablet Take 10 mg by mouth at bedtime as needed for sleep.     No current facility-administered medications on file prior to visit.     PAST MEDICAL HISTORY: Past Medical History:  Diagnosis Date  . Alcohol abuse   . Anemia   . Asthma   . Blood in stool   . Colon polyps   . COPD (chronic obstructive pulmonary disease) (HCC)   . Depression   . Depression   .  GERD (gastroesophageal reflux disease)   . Heart murmur   . History of alcohol abuse   . History of blood transfusion 2006  . HLD (hyperlipidemia)   . Obesity   . Sleep apnea   . Thyroid disease     PAST SURGICAL HISTORY: Past Surgical History:  Procedure Laterality Date  . AUGMENTATION MAMMAPLASTY    . BREAST BIOPSY    . LAPAROSCOPIC GASTRIC BANDING  2010    SOCIAL HISTORY: Social History   Tobacco Use  . Smoking status: Former Games developer  . Smokeless tobacco: Never Used  Substance Use Topics  . Alcohol use: No    Frequency: Never  . Drug use: No    FAMILY HISTORY: Family History  Problem Relation Age of Onset  . Alcohol  abuse Mother   . Depression Mother   . Early death Mother   . Liver disease Mother   . Alcohol abuse Father   . Obesity Father   . Cancer Father     ROS: Review of Systems  Constitutional: Positive for weight loss.  Cardiovascular: Negative for chest pain and claudication.  Gastrointestinal: Negative for nausea and vomiting.  Musculoskeletal: Negative for myalgias.       Negative muscle weakness  Endo/Heme/Allergies:       Negative hypoglycemia Negative polyphagia    PHYSICAL EXAM: Blood pressure 126/86, pulse 93, temperature 97.7 F (36.5 C), temperature source Oral, height 5\' 4"  (1.626 m), weight 281 lb (127.5 kg), SpO2 96 %. Body mass index is 48.23 kg/m. Physical Exam  Constitutional: She is oriented to person, place, and time. She appears well-developed and well-nourished.  Cardiovascular: Normal rate.  Pulmonary/Chest: Effort normal.  Musculoskeletal: Normal range of motion.  Neurological: She is alert and oriented to person, place, and time.  Skin: Skin is warm and dry.  Psychiatric: She has a normal mood and affect. Her behavior is normal.  Vitals reviewed.   RECENT LABS AND TESTS: BMET    Component Value Date/Time   NA 138 06/05/2018 0937   K 4.7 06/05/2018 0937   CL 99 06/05/2018 0937   CO2 23 06/05/2018 0937   GLUCOSE 99 06/05/2018 0937   GLUCOSE 108 (H) 05/23/2018 0853   BUN 10 06/05/2018 0937   CREATININE 0.58 06/05/2018 0937   CALCIUM 9.2 06/05/2018 0937   GFRNONAA 103 06/05/2018 0937   GFRAA 118 06/05/2018 0937   Lab Results  Component Value Date   HGBA1C 6.0 (H) 06/23/2018   Lab Results  Component Value Date   INSULIN 10.9 06/05/2018   CBC    Component Value Date/Time   WBC 6.2 06/05/2018 0937   RBC 5.23 06/05/2018 0937   HGB 15.2 06/05/2018 0937   HCT 45.3 06/05/2018 0937   MCV 87 06/05/2018 0937   MCH 29.1 06/05/2018 0937   MCHC 33.6 06/05/2018 0937   RDW 15.2 06/05/2018 0937   LYMPHSABS 2.1 06/05/2018 0937   EOSABS 0.1  06/05/2018 0937   BASOSABS 0.1 06/05/2018 0937   Iron/TIBC/Ferritin/ %Sat No results found for: IRON, TIBC, FERRITIN, IRONPCTSAT Lipid Panel     Component Value Date/Time   CHOL 253 (H) 05/23/2018 0853   TRIG 83.0 05/23/2018 0853   HDL 51.50 05/23/2018 0853   CHOLHDL 5 05/23/2018 0853   VLDL 16.6 05/23/2018 0853   LDLCALC 185 (H) 05/23/2018 0853   Hepatic Function Panel     Component Value Date/Time   PROT 6.8 06/05/2018 0937   ALBUMIN 4.1 06/05/2018 0937   AST 18 06/05/2018 1610  ALT 20 06/05/2018 0937   ALKPHOS 89 06/05/2018 0937   BILITOT 0.3 06/05/2018 0937      Component Value Date/Time   TSH 4.450 06/05/2018 0937   TSH 3.47 01/27/2018 1143  Results for AASHIKA, CARTA ANN (MRN [id number]) as of 09/25/2018 13:33  Ref. Range 06/05/2018 09:37  Vitamin D, 25-Hydroxy Latest Ref Range: 30.0 - 100.0 ng/mL 18.6 (L)    ASSESSMENT AND PLAN: Vitamin D deficiency - Plan: VITAMIN D 25 Hydroxy (Vit-D Deficiency, Fractures), Vitamin D, Ergocalciferol, ([name]) 50000 units CAPS capsule  Prediabetes - Plan: Comprehensive metabolic panel, Hemoglobin A1c, Insulin, random  Other hyperlipidemia - Plan: Lipid Panel With LDL/HDL Ratio  At risk for osteoporosis  Class 3 severe obesity with serious comorbidity and body mass index (BMI) of 45.0 to 49.9 in adult, unspecified obesity type (HCC)  PLAN:  Vitamin D Deficiency Pamela [name] was informed that low vitamin D levels contributes to fatigue and are associated with obesity, breast, and colon cancer. Pamela [name] agrees to continue taking prescription Vit D @50 ,000 IU every week #4 and we will refill for 1 month. She will follow up for routine testing of vitamin D, at least 2-3 times per year. She was informed of the risk of over-replacement of vitamin D and agrees to not increase her dose unless she discusses this with [name] first. We will check labs and Cindie agrees to follow up with our clinic in 3 weeks.  At risk for osteopenia and  osteoporosis Pamela [name] was given extended (15 minutes) osteoporosis prevention counseling today. Pamela [name] is at risk for osteopenia and osteoporsis due to her vitamin D deficiency. She was encouraged to take her vitamin D and follow her higher calcium diet and increase strengthening exercise to help strengthen her bones and decrease her risk of osteopenia and osteoporosis.  Pre-Diabetes Pamela [name] will continue to work on weight loss, diet, exercise, and decreasing simple carbohydrates in her diet to help decrease the risk of diabetes. We dicussed metformin including benefits and risks. She was informed that eating too many simple carbohydrates or too many calories at one sitting increases the likelihood of GI side effects. Pamela [name] declined metformin for now and a prescription was not written today. We will check labs and Jeryn agrees to follow up with our clinic in 3 weeks as directed to monitor her progress.  Hyperlipidemia Pamela [name] was informed of the American Heart Association Guidelines emphasizing intensive lifestyle modifications as the first line treatment for hyperlipidemia. We discussed many lifestyle modifications today in depth, and Lacrisha will continue to work on decreasing saturated fats such as fatty red meat, butter and many fried foods. She will also increase vegetables and lean protein in her diet and continue to work on diet, exercise, and weight loss efforts. We will check labs and Pamela [name] agrees to follow up with our clinic in 3 weeks.  Obesity Pamela [name] is currently in the action stage of change. As such, her goal is to continue with weight loss efforts She has agreed to keep a food journal with 1300-1400 calories and 75 grams of protein daily Pamela [name] has been instructed to work up to a goal of 150 minutes of combined cardio and strengthening exercise per week for weight loss and overall health benefits. We discussed the following Behavioral Modification Strategies today: work on meal  planning and easy cooking plans and ways to avoid boredom eating   Pamela [name] has agreed to follow up with our clinic in 3 weeks. She was informed of the importance of frequent follow up visits  to maximize her success with intensive lifestyle modifications for her multiple health conditions.   OBESITY BEHAVIORAL INTERVENTION VISIT  Today's visit was # 6   Starting weight: 298 lbs Starting date: 06/05/18 Today's weight : 281 lbs  Today's date: 09/25/2018 Total lbs lost to date: 17    ASK: We discussed the diagnosis of obesity with Pamela [name] today and Pamela [name] agreed to give [name] permission to discuss obesity behavioral modification therapy today.  ASSESS: Pamela [name] has the diagnosis of obesity and her BMI today is 52.21 Pamela [name] is in the action stage of change   ADVISE: Pamela [name] was educated on the multiple health risks of obesity as well as the benefit of weight loss to improve her health. She was advised of the need for long term treatment and the importance of lifestyle modifications.  AGREE: Multiple dietary modification options and treatment options were discussed and  Pamela [name] agreed to the above obesity treatment plan.  Trude Mcburney, am acting as transcriptionist for Alois Cliche, PA-C I, Alois Cliche, PA-C have reviewed above note and agree with its content

## 2018-09-26 LAB — LIPID PANEL WITH LDL/HDL RATIO
CHOLESTEROL TOTAL: 237 mg/dL — AB (ref 100–199)
HDL: 46 mg/dL (ref >39)
LDL CALC: 171 mg/dL — AB (ref 0–99)
LDl/HDL Ratio: 3.7 ratio — ABNORMAL HIGH (ref 0.0–3.2)
TRIGLYCERIDES: 99 mg/dL (ref 0–149)
VLDL CHOLESTEROL CAL: 20 mg/dL (ref 5–40)

## 2018-09-26 LAB — COMPREHENSIVE METABOLIC PANEL
ALT: 18 IU/L (ref 0–32)
AST: 19 IU/L (ref 0–40)
Albumin/Globulin Ratio: 1.9 (ref 1.2–2.2)
Albumin: 4.1 g/dL (ref 3.5–5.5)
Alkaline Phosphatase: 86 IU/L (ref 39–117)
BUN/Creatinine Ratio: 16 (ref 9–23)
BUN: 11 mg/dL (ref 6–24)
Bilirubin Total: 0.3 mg/dL (ref 0.0–1.2)
CO2: 23 mmol/L (ref 20–29)
Calcium: 9.2 mg/dL (ref 8.7–10.2)
Chloride: 103 mmol/L (ref 96–106)
Creatinine, Ser: 0.67 mg/dL (ref 0.57–1.00)
GFR calc Af Amer: 112 mL/min/{1.73_m2} (ref >59)
GFR calc non Af Amer: 97 mL/min/{1.73_m2} (ref >59)
Globulin, Total: 2.2 g/dL (ref 1.5–4.5)
Glucose: 103 mg/dL — ABNORMAL HIGH (ref 65–99)
Potassium: 4.7 mmol/L (ref 3.5–5.2)
Sodium: 143 mmol/L (ref 134–144)
Total Protein: 6.3 g/dL (ref 6.0–8.5)

## 2018-09-26 LAB — HEMOGLOBIN A1C
Est. average glucose Bld gHb Est-mCnc: 123 mg/dL
Hgb A1c MFr Bld: 5.9 % — ABNORMAL HIGH (ref 4.8–5.6)

## 2018-09-26 LAB — INSULIN, RANDOM: INSULIN: 13.5 u[IU]/mL (ref 2.6–24.9)

## 2018-09-26 LAB — VITAMIN D 25 HYDROXY (VIT D DEFICIENCY, FRACTURES): Vit D, 25-Hydroxy: 38.4 ng/mL (ref 30.0–100.0)

## 2018-10-02 ENCOUNTER — Ambulatory Visit: Payer: BC Managed Care – PPO | Admitting: Medical

## 2018-10-02 ENCOUNTER — Encounter (INDEPENDENT_AMBULATORY_CARE_PROVIDER_SITE_OTHER): Payer: Self-pay

## 2018-10-02 ENCOUNTER — Encounter: Payer: Self-pay | Admitting: Family Medicine

## 2018-10-02 ENCOUNTER — Ambulatory Visit: Payer: BC Managed Care – PPO | Admitting: Family Medicine

## 2018-10-02 ENCOUNTER — Ambulatory Visit: Payer: Self-pay | Admitting: *Deleted

## 2018-10-02 VITALS — BP 124/86 | HR 89 | Temp 98.6°F | Ht 64.0 in | Wt 284.4 lb

## 2018-10-02 DIAGNOSIS — J4541 Moderate persistent asthma with (acute) exacerbation: Secondary | ICD-10-CM | POA: Diagnosis not present

## 2018-10-02 DIAGNOSIS — J209 Acute bronchitis, unspecified: Secondary | ICD-10-CM

## 2018-10-02 MED ORDER — PREDNISONE 20 MG PO TABS: 40.0000 mg | ORAL_TABLET | Freq: Every day | ORAL | 0 refills | Status: DC

## 2018-10-02 NOTE — Telephone Encounter (Signed)
Patient called with questions about taking Prednisone and Mucinex together. Unable to reach patient, routed question to provider.

## 2018-10-02 NOTE — Progress Notes (Signed)
Pre visit review using our clinic review tool, if applicable. No additional management support is needed unless otherwise documented below in the visit note. 

## 2018-10-02 NOTE — Telephone Encounter (Signed)
Call returned to patient. Unable to leave message. Mailbox is full.

## 2018-10-02 NOTE — Progress Notes (Signed)
Chief Complaint  Patient presents with  . Cough    congestion  . Wheezing  . Sore Throat    Pamela [name] here for URI complaints.  Duration: 2 days  Associated symptoms: ear fullness, sore throat, wheezing, shortness of breath and cough Denies: sinus congestion, sinus pain, rhinorrhea, itchy watery eyes, ear pain, ear drainage, fevers and myalgia Treatment to date: Mucinex Sick contacts: Yes  +hx of COPD/asthma.  ROS:  Const: Denies fevers HEENT: As noted in HPI Lungs: No SOB  Past Medical History:  Diagnosis Date  . Alcohol abuse   . Anemia   . Asthma   . Blood in stool   . Colon polyps   . COPD (chronic obstructive pulmonary disease) (HCC)   . Depression   . Depression   . GERD (gastroesophageal reflux disease)   . Heart murmur   . History of alcohol abuse   . History of blood transfusion 2006  . HLD (hyperlipidemia)   . Obesity   . Sleep apnea   . Thyroid disease     BP 124/86 (BP Location: Left Arm, Patient Position: Sitting, Cuff Size: Normal)   Pulse 89   Temp 98.6 F (37 C) (Oral)   Ht 5\' 4"  (1.626 m)   Wt 284 lb 6 oz (129 kg)   SpO2 93%   BMI 48.81 kg/m  General: Awake, alert, appears stated age HEENT: AT, [place], ears patent b/l and TM neg on L, serous fluid on R, nares patent w/o discharge, pharynx pink and without exudates, MMM Neck: No masses or asymmetry Heart: RRR Lungs: +diffuse exp wheezing, no accessory muscle use Psych: Age appropriate judgment and insight, normal mood and affect  Acute bronchitis, unspecified organism  Moderate persistent asthma with exacerbation - Plan: predniSONE (DELTASONE) 20 MG tablet  Orders as above. Send message if not improving, low threshold for macrolide.  Continue to push fluids, practice good hand hygiene, cover mouth when coughing. F/u prn. If starting to experience fevers, shaking, or shortness of breath, seek immediate care. Pt voiced understanding and agreement to the plan.  [name]  [place], DO 10/02/18 3:14 PM

## 2018-10-02 NOTE — Patient Instructions (Addendum)
Continue to push fluids, practice good hand hygiene, and cover your mouth if you cough.  If you start having fevers, shaking or shortness of breath, seek immediate care.  Send me a Mychart message if anything changes.  For symptoms, consider using Vick's VapoRub on chest or under nose, air humidifier, Benadryl at night, and elevating the head of the bed. Tylenol and ibuprofen for aches and pains you may be experiencing.   I think you have bronchitis. Most causes of bronchitis are viral in nature. If we prescribe antibiotics for viral illnesses, not only will it fail to help you get better, but has also been shown to do harm.   Let us know if you need anything.

## 2018-10-03 NOTE — Telephone Encounter (Signed)
That's fine to do. TY.

## 2018-10-03 NOTE — Telephone Encounter (Signed)
Patient informed of PCP ok

## 2018-10-06 ENCOUNTER — Encounter: Payer: Self-pay | Admitting: Family Medicine

## 2018-10-06 ENCOUNTER — Ambulatory Visit (INDEPENDENT_AMBULATORY_CARE_PROVIDER_SITE_OTHER): Payer: BC Managed Care – PPO | Admitting: Family Medicine

## 2018-10-06 VITALS — BP 130/80 | HR 80 | Temp 98.2°F | Ht 64.0 in | Wt 284.0 lb

## 2018-10-06 DIAGNOSIS — J209 Acute bronchitis, unspecified: Secondary | ICD-10-CM | POA: Diagnosis not present

## 2018-10-06 MED ORDER — AZITHROMYCIN 250 MG PO TABS: ORAL_TABLET | ORAL | 0 refills | Status: DC

## 2018-10-06 MED ORDER — BENZONATATE 100 MG PO CAPS: 100.0000 mg | ORAL_CAPSULE | Freq: Three times a day (TID) | ORAL | 0 refills | Status: DC | PRN

## 2018-10-06 NOTE — Patient Instructions (Addendum)
Continue to push fluids, practice good hand hygiene, and cover your mouth if you cough.  If you start having fevers, shaking or shortness of breath, seek immediate care.  Let me know if medicine is too expensive.  Let us know if you need anything.

## 2018-10-06 NOTE — Progress Notes (Signed)
Chief Complaint  Patient presents with  . Cough  . Wheezing    Pamela [name] here for URI complaints.  Duration: 7 days  Associated symptoms: rhinorrhea, ear fullness, wheezing, shortness of breath and cough Denies: sinus congestion, sinus pain, itchy watery eyes, ear pain, ear drainage, sore throat, myalgia and fevers Treatment to date: Prednisone, Mucinex; seen 4 d ago  ROS:  Const: Denies fevers HEENT: As noted in HPI Lungs: +SOB  Past Medical History:  Diagnosis Date  . Alcohol abuse   . Anemia   . Asthma   . Blood in stool   . Colon polyps   . COPD (chronic obstructive pulmonary disease) (HCC)   . Depression   . Depression   . GERD (gastroesophageal reflux disease)   . Heart murmur   . History of alcohol abuse   . History of blood transfusion 2006  . HLD (hyperlipidemia)   . Obesity   . Sleep apnea   . Thyroid disease     BP 130/80 (BP Location: Left Arm, Patient Position: Sitting, Cuff Size: Normal)   Pulse 80   Temp 98.2 F (36.8 C) (Oral)   Ht 5\' 4"  (1.626 m)   Wt 284 lb (128.8 kg)   SpO2 97%   BMI 48.75 kg/m  General: Awake, alert, appears stated age HEENT: AT, [place], ears patent b/l and TM's neg, nares patent w/o discharge, pharynx pink and without exudates, MMM Neck: No masses or asymmetry Heart: RRR Lungs: faint exp wheezes in upper lung fields b/l, no accessory muscle use Psych: Age appropriate judgment and insight, normal mood and affect  Acute bronchitis, unspecified organism - Plan: azithromycin (ZITHROMAX) 250 MG tablet, benzonatate (TESSALON) 100 MG capsule  Worsening despite steroid, will tx w abx. Continue to push fluids, practice good hand hygiene, cover mouth when coughing. F/u prn. If starting to experience fevers, shaking, or shortness of breath, seek immediate care. Pt voiced understanding and agreement to the plan.  [name] [place], DO 10/06/18 9:41 AM

## 2018-10-06 NOTE — Progress Notes (Signed)
Pre visit review using our clinic review tool, if applicable. No additional management support is needed unless otherwise documented below in the visit note. 

## 2018-10-20 ENCOUNTER — Ambulatory Visit (INDEPENDENT_AMBULATORY_CARE_PROVIDER_SITE_OTHER): Payer: BC Managed Care – PPO | Admitting: Physician Assistant

## 2018-10-27 ENCOUNTER — Other Ambulatory Visit: Payer: Self-pay | Admitting: Family Medicine

## 2018-10-27 DIAGNOSIS — B354 Tinea corporis: Secondary | ICD-10-CM

## 2018-11-03 ENCOUNTER — Encounter (INDEPENDENT_AMBULATORY_CARE_PROVIDER_SITE_OTHER): Payer: Self-pay | Admitting: Physician Assistant

## 2018-11-03 ENCOUNTER — Ambulatory Visit (INDEPENDENT_AMBULATORY_CARE_PROVIDER_SITE_OTHER): Payer: BC Managed Care – PPO | Admitting: Physician Assistant

## 2018-11-03 VITALS — BP 133/94 | HR 98 | Temp 98.7°F | Ht 64.0 in | Wt 277.0 lb

## 2018-11-03 DIAGNOSIS — E7849 Other hyperlipidemia: Secondary | ICD-10-CM | POA: Diagnosis not present

## 2018-11-03 DIAGNOSIS — Z9189 Other specified personal risk factors, not elsewhere classified: Secondary | ICD-10-CM | POA: Diagnosis not present

## 2018-11-03 DIAGNOSIS — E559 Vitamin D deficiency, unspecified: Secondary | ICD-10-CM

## 2018-11-03 DIAGNOSIS — E66813 Obesity, class 3: Secondary | ICD-10-CM

## 2018-11-03 DIAGNOSIS — Z6841 Body Mass Index (BMI) 40.0 and over, adult: Secondary | ICD-10-CM

## 2018-11-03 MED ORDER — VITAMIN D (ERGOCALCIFEROL) 1.25 MG (50000 UNIT) PO CAPS: 50000.0000 [IU] | ORAL_CAPSULE | ORAL | 0 refills | Status: DC

## 2018-11-03 NOTE — Progress Notes (Signed)
Office: [phone]  /  Fax: [phone]   HPI:   Chief Complaint: OBESITY Pamela [name] is here to discuss her progress with her obesity treatment plan. She is on the keep a food journal with 1300 to 1400 calories and 75 grams of protein daily plan and is following her eating plan approximately 75 % of the time. She states she is exercising 0 minutes 0 times per week. Pamela [name] did very well with weight loss. She is making herself eat, although she does not feel like it, due to her dog being very sick. She is eating extra yogurt and cottage cheese. Her weight is 277 lb (125.6 kg) today and has had a weight loss of 4 pounds over a period of 5 weeks since her last visit. She has lost 21 lbs since starting treatment with [name].  Vitamin D deficiency Pamela [name] has a diagnosis of vitamin D deficiency. She is currently taking vit D and denies nausea, vomiting or muscle weakness.  Hyperlipidemia Pamela [name] has hyperlipidemia and has been trying to improve her cholesterol levels with intensive lifestyle modification including a low saturated fat diet, exercise and weight loss. She is not on medications. She denies any chest pain.  At risk for cardiovascular disease Pamela [name] is at a higher than average risk for cardiovascular disease due to obesity and hyperlipidemia. She currently denies any chest pain.  ALLERGIES: No Known Allergies  MEDICATIONS: Current Outpatient Medications on File Prior to Visit  Medication Sig Dispense Refill  . albuterol (2.5 MG/3ML) 0.083% NEBU 3 mL, albuterol (5 MG/ML) 0.5% NEBU 0.5 mL Inhale 3 mg into the lungs as needed.    [name] azithromycin (ZITHROMAX) 250 MG tablet Take 2 tabs the first day and then 1 tab daily until you run out. 6 tablet 0  . benzonatate (TESSALON) 100 MG capsule Take 1 capsule (100 mg total) by mouth 3 (three) times daily as needed. 30 capsule 0  . citalopram (CELEXA) 20 MG tablet Take 20 mg by mouth daily.    . clobetasol (TEMOVATE) 0.05 % external solution  Apply 1 application topically 2 (two) times daily.    . Fluticasone-Salmeterol (WIXELA INHUB) 500-50 MCG/DOSE AEPB Inhale 1 puff into the lungs 2 (two) times daily.    [name] ketoconazole (NIZORAL) 2 % cream APPLY TO AREA ON LEFT ARM AND LEFT LEG DAILY FOR 14 DAYS 30 g 0  . levothyroxine (SYNTHROID, LEVOTHROID) 150 MCG tablet Take 1 tablet (150 mcg total) by mouth daily before breakfast. 30 tablet 2  . modafinil (PROVIGIL) 200 MG tablet Take 200 mg by mouth daily.    [name] omeprazole (PRILOSEC) 40 MG capsule Take 40 mg by mouth daily.    [name] PROAIR RESPICLICK 108 (90 Base) MCG/ACT AEPB INHALE 1 PUFF BY MOUTH( INTO THE LUNGS) AS NEEDED 1 each 3  . pseudoephedrine (SUDAFED) 30 MG tablet Take 1 tablet (30 mg total) by mouth every 6 (six) hours as needed for congestion. 20 tablet 0  . umeclidinium bromide (INCRUSE ELLIPTA) 62.5 MCG/INH AEPB Inhale 1 puff into the lungs daily. 30 each 5  . zolpidem (AMBIEN) 10 MG tablet Take 10 mg by mouth at bedtime as needed for sleep.     No current facility-administered medications on file prior to visit.     PAST MEDICAL HISTORY: Past Medical History:  Diagnosis Date  . Alcohol abuse   . Anemia   . Asthma   . Blood in stool   . Colon polyps   . COPD (chronic obstructive pulmonary disease) (HCC)   .  Depression   . Depression   . GERD (gastroesophageal reflux disease)   . Heart murmur   . History of alcohol abuse   . History of blood transfusion 2006  . HLD (hyperlipidemia)   . Obesity   . Sleep apnea   . Thyroid disease     PAST SURGICAL HISTORY: Past Surgical History:  Procedure Laterality Date  . AUGMENTATION MAMMAPLASTY    . BREAST BIOPSY    . LAPAROSCOPIC GASTRIC BANDING  2010    SOCIAL HISTORY: Social History   Tobacco Use  . Smoking status: Former Games developer  . Smokeless tobacco: Never Used  Substance Use Topics  . Alcohol use: No    Frequency: Never  . Drug use: No    FAMILY HISTORY: Family History  Problem Relation Age of Onset  .  Alcohol abuse Mother   . Depression Mother   . Early death Mother   . Liver disease Mother   . Alcohol abuse Father   . Obesity Father   . Cancer Father     ROS: Review of Systems  Constitutional: Positive for weight loss.  Cardiovascular: Negative for chest pain.  Gastrointestinal: Negative for nausea and vomiting.  Musculoskeletal:       Negative for muscle weakness    PHYSICAL EXAM: Blood pressure (!) 133/94, pulse 98, temperature 98.7 F (37.1 C), temperature source Oral, height 5\' 4"  (1.626 m), weight 277 lb (125.6 kg), SpO2 94 %. Body mass index is 47.55 kg/m. Physical Exam  Constitutional: She is oriented to person, place, and time. She appears well-developed and well-nourished.  Cardiovascular: Normal rate.  Pulmonary/Chest: Effort normal.  Musculoskeletal: Normal range of motion.  Neurological: She is oriented to person, place, and time.  Skin: Skin is warm and dry.  Psychiatric: She has a normal mood and affect. Her behavior is normal.  Vitals reviewed.   RECENT LABS AND TESTS: BMET    Component Value Date/Time   NA 143 09/25/2018 0949   K 4.7 09/25/2018 0949   CL 103 09/25/2018 0949   CO2 23 09/25/2018 0949   GLUCOSE 103 (H) 09/25/2018 0949   GLUCOSE 108 (H) 05/23/2018 0853   BUN 11 09/25/2018 0949   CREATININE 0.67 09/25/2018 0949   CALCIUM 9.2 09/25/2018 0949   GFRNONAA 97 09/25/2018 0949   GFRAA 112 09/25/2018 0949   Lab Results  Component Value Date   HGBA1C 5.9 (H) 09/25/2018   HGBA1C 6.0 (H) 06/23/2018   Lab Results  Component Value Date   INSULIN 13.5 09/25/2018   INSULIN 10.9 06/05/2018   CBC    Component Value Date/Time   WBC 6.2 06/05/2018 0937   RBC 5.23 06/05/2018 0937   HGB 15.2 06/05/2018 0937   HCT 45.3 06/05/2018 0937   MCV 87 06/05/2018 0937   MCH 29.1 06/05/2018 0937   MCHC 33.6 06/05/2018 0937   RDW 15.2 06/05/2018 0937   LYMPHSABS 2.1 06/05/2018 0937   EOSABS 0.1 06/05/2018 0937   BASOSABS 0.1 06/05/2018 0937    Iron/TIBC/Ferritin/ %Sat No results found for: IRON, TIBC, FERRITIN, IRONPCTSAT Lipid Panel     Component Value Date/Time   CHOL 237 (H) 09/25/2018 0949   TRIG 99 09/25/2018 0949   HDL 46 09/25/2018 0949   CHOLHDL 5 05/23/2018 0853   VLDL 16.6 05/23/2018 0853   LDLCALC 171 (H) 09/25/2018 0949   Hepatic Function Panel     Component Value Date/Time   PROT 6.3 09/25/2018 0949   ALBUMIN 4.1 09/25/2018 0949   AST 19  09/25/2018 0949   ALT 18 09/25/2018 0949   ALKPHOS 86 09/25/2018 0949   BILITOT 0.3 09/25/2018 0949      Component Value Date/Time   TSH 4.450 06/05/2018 0937   TSH 3.47 01/27/2018 1143   Results for ESTEL, TONELLI ANN (MRN [id number]) as of 11/03/2018 16:18  Ref. Range 09/25/2018 09:49  Vitamin D, 25-Hydroxy Latest Ref Range: 30.0 - 100.0 ng/mL 38.4   ASSESSMENT AND PLAN: Vitamin D deficiency - Plan: Vitamin D, Ergocalciferol, ([name]) 1.25 MG (50000 UT) CAPS capsule  Other hyperlipidemia  At risk for heart disease  Class 3 severe obesity with serious comorbidity and body mass index (BMI) of 45.0 to 49.9 in adult, unspecified obesity type (HCC)  PLAN:  Vitamin D Deficiency Pamela [name] was informed that low vitamin D levels contributes to fatigue and are associated with obesity, breast, and colon cancer. She agrees to continue to take prescription Vit D @50 ,000 IU every week #4 with no refills and will follow up for routine testing of vitamin D, at least 2-3 times per year. She was informed of the risk of over-replacement of vitamin D and agrees to not increase her dose unless she discusses this with [name] first. Neesa agrees to follow up as directed.  Hyperlipidemia Pamela [name] was informed of the American Heart Association Guidelines emphasizing intensive lifestyle modifications as the first line treatment for hyperlipidemia. We discussed many lifestyle modifications today in depth, and Pamela [name] will continue to work on decreasing saturated fats such as fatty red  meat, butter and many fried foods. She will also increase vegetables and lean protein in her diet and continue to work on exercise and weight loss efforts.  Cardiovascular risk counseling Pamela [name] was given extended (15 minutes) coronary artery disease prevention counseling today. She is 57 y.o. female and has risk factors for heart disease including obesity and hyperlipidemia. We discussed intensive lifestyle modifications today with an emphasis on specific weight loss instructions and strategies. Pt was also informed of the importance of increasing exercise and decreasing saturated fats to help prevent heart disease.  Obesity Pamela [name] is currently in the action stage of change. As such, her goal is to continue with weight loss efforts She has agreed to keep a food journal with 1300 to 1400 calories and 75 grams of protein  Pamela [name] has been instructed to work up to a goal of 150 minutes of combined cardio and strengthening exercise per week for weight loss and overall health benefits. We discussed the following Behavioral Modification Strategies today: work on meal planning and easy cooking plans and holiday eating strategies   Pamela [name] has agreed to follow up with our clinic in 3 weeks. She was informed of the importance of frequent follow up visits to maximize her success with intensive lifestyle modifications for her multiple health conditions.   OBESITY BEHAVIORAL INTERVENTION VISIT  Today's visit was # 7   Starting weight: 298 lbs Starting date: 06/05/2018 Today's weight : 277 lbs  Today's date: 11/03/2018 Total lbs lost to date: 21   ASK: We discussed the diagnosis of obesity with Pamela [name] today and Pamela [name] agreed to give [name] permission to discuss obesity behavioral modification therapy today.  ASSESS: Pamela [name] has the diagnosis of obesity and her BMI today is 9.52 Pamela [name] is in the action stage of change   ADVISE: Pamela [name] was educated on the multiple health risks of  obesity as well as the benefit of weight loss to improve her health. She was advised of the need for long term  treatment and the importance of lifestyle modifications to improve her current health and to decrease her risk of future health problems.  AGREE: Multiple dietary modification options and treatment options were discussed and  Inesha agreed to follow the recommendations documented in the above note.  ARRANGE: Pamela [name] was educated on the importance of frequent visits to treat obesity as outlined per CMS and USPSTF guidelines and agreed to schedule her next follow up appointment today.  Cristi LoronI, Joanne Murray, am acting as transcriptionist for Alois Clicheracey Xia Stohr, PA-C I, Alois Clicheracey Lakelyn Straus, PA-C have reviewed above note and agree with its content

## 2018-11-11 ENCOUNTER — Encounter: Payer: Self-pay | Admitting: Family Medicine

## 2018-11-11 ENCOUNTER — Other Ambulatory Visit: Payer: Self-pay | Admitting: Family Medicine

## 2018-11-11 MED ORDER — ALBUTEROL SULFATE HFA 108 (90 BASE) MCG/ACT IN AERS: 1.0000 | INHALATION_SPRAY | Freq: Four times a day (QID) | RESPIRATORY_TRACT | 3 refills | Status: DC | PRN

## 2018-11-24 ENCOUNTER — Encounter (INDEPENDENT_AMBULATORY_CARE_PROVIDER_SITE_OTHER): Payer: Self-pay | Admitting: Physician Assistant

## 2018-11-24 ENCOUNTER — Ambulatory Visit (INDEPENDENT_AMBULATORY_CARE_PROVIDER_SITE_OTHER): Payer: BC Managed Care – PPO | Admitting: Physician Assistant

## 2018-11-24 VITALS — BP 137/85 | HR 100 | Temp 97.9°F | Ht 64.0 in | Wt 277.0 lb

## 2018-11-24 DIAGNOSIS — Z6841 Body Mass Index (BMI) 40.0 and over, adult: Secondary | ICD-10-CM

## 2018-11-24 DIAGNOSIS — E559 Vitamin D deficiency, unspecified: Secondary | ICD-10-CM

## 2018-11-24 NOTE — Progress Notes (Signed)
Office: [phone]  /  Fax: [phone]   HPI:   Chief Complaint: OBESITY Pamela [name] is here to discuss her progress with her obesity treatment plan. She is on the keep a food journal with 1300-1400 calories and 75 grams of protein daily and is following her eating plan approximately 50 % of the time. She states she is exercising 0 minutes 0 times per week. Pamela [name] reports that she has been struggling with getting all of her protein in. She is ready to get back on track.  Her weight is 277 lb (125.6 kg) today and has not lost weight since her last visit. She has lost 21 lbs since starting treatment with us.  Vitamin D Deficiency Pamela [name] has a diagnosis of vitamin D deficiency. She is currently taking prescription Vit D and denies nausea, vomiting or muscle weakness.  ALLERGIES: No Known Allergies  MEDICATIONS: Current Outpatient Medications on File Prior to Visit  Medication Sig Dispense Refill  . albuterol (2.5 MG/3ML) 0.083% NEBU 3 mL, albuterol (5 MG/ML) 0.5% NEBU 0.5 mL Inhale 3 mg into the lungs as needed.    [name]. albuterol (PROVENTIL HFA;VENTOLIN HFA) 108 (90 Base) MCG/ACT inhaler Inhale 1-2 puffs into the lungs every 6 (six) hours as needed for wheezing or shortness of breath. 1 Inhaler 3  . citalopram (CELEXA) 20 MG tablet Take 20 mg by mouth daily.    . clobetasol (TEMOVATE) 0.05 % external solution Apply 1 application topically 2 (two) times daily.    . Fluticasone-Salmeterol (WIXELA INHUB) 500-50 MCG/DOSE AEPB Inhale 1 puff into the lungs 2 (two) times daily.    [name]. ketoconazole (NIZORAL) 2 % cream APPLY TO AREA ON LEFT ARM AND LEFT LEG DAILY FOR 14 DAYS 30 g 0  . levothyroxine (SYNTHROID, LEVOTHROID) 150 MCG tablet Take 1 tablet (150 mcg total) by mouth daily before breakfast. 30 tablet 2  . modafinil (PROVIGIL) 200 MG tablet Take 200 mg by mouth daily.    [name]. omeprazole (PRILOSEC) 40 MG capsule Take 40 mg by mouth daily.    . pseudoephedrine (SUDAFED) 30 MG tablet Take 1 tablet  (30 mg total) by mouth every 6 (six) hours as needed for congestion. 20 tablet 0  . umeclidinium bromide (INCRUSE ELLIPTA) 62.5 MCG/INH AEPB Inhale 1 puff into the lungs daily. 30 each 5  . Vitamin D, Ergocalciferol, ([name]) 1.25 MG (50000 UT) CAPS capsule Take 1 capsule (50,000 Units total) by mouth every 7 (seven) days. 4 capsule 0  . zolpidem (AMBIEN) 10 MG tablet Take 10 mg by mouth at bedtime as needed for sleep.     No current facility-administered medications on file prior to visit.     PAST MEDICAL HISTORY: Past Medical History:  Diagnosis Date  . Alcohol abuse   . Anemia   . Asthma   . Blood in stool   . Colon polyps   . COPD (chronic obstructive pulmonary disease) (HCC)   . Depression   . Depression   . GERD (gastroesophageal reflux disease)   . Heart murmur   . History of alcohol abuse   . History of blood transfusion 2006  . HLD (hyperlipidemia)   . Obesity   . Sleep apnea   . Thyroid disease     PAST SURGICAL HISTORY: Past Surgical History:  Procedure Laterality Date  . AUGMENTATION MAMMAPLASTY    . BREAST BIOPSY    . LAPAROSCOPIC GASTRIC BANDING  2010    SOCIAL HISTORY: Social History   Tobacco Use  . Smoking status:  Former Smoker  . Smokeless tobacco: Never Used  Substance Use Topics  . Alcohol use: No    Frequency: Never  . Drug use: No    FAMILY HISTORY: Family History  Problem Relation Age of Onset  . Alcohol abuse Mother   . Depression Mother   . Early death Mother   . Liver disease Mother   . Alcohol abuse Father   . Obesity Father   . Cancer Father     ROS: Review of Systems  Constitutional: Negative for malaise/fatigue.  Gastrointestinal: Negative for nausea and vomiting.  Musculoskeletal:       Negative muscle weakness    PHYSICAL EXAM: Blood pressure 137/85, pulse 100, temperature 97.9 F (36.6 C), temperature source Oral, height 5\' 4"  (1.626 m), weight 277 lb (125.6 kg), SpO2 93 %. Body mass index is 47.55  kg/m. Physical Exam Vitals signs reviewed.  Constitutional:      Appearance: Normal appearance. She is obese.  Cardiovascular:     Rate and Rhythm: Normal rate.     Pulses: Normal pulses.  Pulmonary:     Effort: Pulmonary effort is normal.  Musculoskeletal: Normal range of motion.  Skin:    General: Skin is warm and dry.  Neurological:     Mental Status: She is alert and oriented to person, place, and time.  Psychiatric:        Mood and Affect: Mood normal.        Behavior: Behavior normal.     RECENT LABS AND TESTS: BMET    Component Value Date/Time   NA 143 09/25/2018 0949   K 4.7 09/25/2018 0949   CL 103 09/25/2018 0949   CO2 23 09/25/2018 0949   GLUCOSE 103 (H) 09/25/2018 0949   GLUCOSE 108 (H) 05/23/2018 0853   BUN 11 09/25/2018 0949   CREATININE 0.67 09/25/2018 0949   CALCIUM 9.2 09/25/2018 0949   GFRNONAA 97 09/25/2018 0949   GFRAA 112 09/25/2018 0949   Lab Results  Component Value Date   HGBA1C 5.9 (H) 09/25/2018   HGBA1C 6.0 (H) 06/23/2018   Lab Results  Component Value Date   INSULIN 13.5 09/25/2018   INSULIN 10.9 06/05/2018   CBC    Component Value Date/Time   WBC 6.2 06/05/2018 0937   RBC 5.23 06/05/2018 0937   HGB 15.2 06/05/2018 0937   HCT 45.3 06/05/2018 0937   MCV 87 06/05/2018 0937   MCH 29.1 06/05/2018 0937   MCHC 33.6 06/05/2018 0937   RDW 15.2 06/05/2018 0937   LYMPHSABS 2.1 06/05/2018 0937   EOSABS 0.1 06/05/2018 0937   BASOSABS 0.1 06/05/2018 0937   Iron/TIBC/Ferritin/ %Sat No results found for: IRON, TIBC, FERRITIN, IRONPCTSAT Lipid Panel     Component Value Date/Time   CHOL 237 (H) 09/25/2018 0949   TRIG 99 09/25/2018 0949   HDL 46 09/25/2018 0949   CHOLHDL 5 05/23/2018 0853   VLDL 16.6 05/23/2018 0853   LDLCALC 171 (H) 09/25/2018 0949   Hepatic Function Panel     Component Value Date/Time   PROT 6.3 09/25/2018 0949   ALBUMIN 4.1 09/25/2018 0949   AST 19 09/25/2018 0949   ALT 18 09/25/2018 0949   ALKPHOS 86  09/25/2018 0949   BILITOT 0.3 09/25/2018 0949      Component Value Date/Time   TSH 4.450 06/05/2018 0937   TSH 3.47 01/27/2018 1143    ASSESSMENT AND PLAN: Vitamin D deficiency  Class 3 severe obesity with serious comorbidity and body mass index (BMI) of  45.0 to 49.9 in adult, unspecified obesity type (HCC)  PLAN:  Vitamin D Deficiency Pamela [name] was informed that low vitamin D levels contributes to fatigue and are associated with obesity, breast, and colon cancer. Pamela [name] agrees to continue taking prescription Vit D @50 ,000 IU every week and will follow up for routine testing of vitamin D, at least 2-3 times per year. She was informed of the risk of over-replacement of vitamin D and agrees to not increase her dose unless she discusses this with [name] first. Pamela [name] agrees to follow up with our clinic in 3 weeks.  I spent > than 50% of the 15 minute visit on counseling as documented in the note.  Obesity Pamela [name] is currently in the action stage of change. As such, her goal is to continue with weight loss efforts She has agreed to keep a food journal with 1300-1400 calories and 75 grams of protein daily Pamela [name] has been instructed to work up to a goal of 150 minutes of combined cardio and strengthening exercise per week for weight loss and overall health benefits. We discussed the following Behavioral Modification Strategies today: work on meal planning and easy cooking plans and keeping healthy foods in the home   Pamela [name] has agreed to follow up with our clinic in 3 weeks. She was informed of the importance of frequent follow up visits to maximize her success with intensive lifestyle modifications for her multiple health conditions.   OBESITY BEHAVIORAL INTERVENTION VISIT  Today's visit was # 8  Starting weight: 298 lbs Starting date: 06/05/18 Today's weight : 277 lbs  Today's date: 11/24/2018 Total lbs lost to date: 21    ASK: We discussed the diagnosis of obesity with Pamela [name] today and Pamela [name] agreed to give [name] permission to discuss obesity behavioral modification therapy today.  ASSESS: Pamela [name] has the diagnosis of obesity and her BMI today is 28.52 Pamela [name] is in the action stage of change   ADVISE: Pamela [name] was educated on the multiple health risks of obesity as well as the benefit of weight loss to improve her health. She was advised of the need for long term treatment and the importance of lifestyle modifications.  AGREE: Multiple dietary modification options and treatment options were discussed and  Pamela [name] agreed to the above obesity treatment plan.  Pamela [name], am acting as transcriptionist for Alois Cliche, PA-C I, Alois Cliche, PA-C have reviewed above note and agree with its content

## 2018-12-15 ENCOUNTER — Encounter (INDEPENDENT_AMBULATORY_CARE_PROVIDER_SITE_OTHER): Payer: Self-pay | Admitting: Physician Assistant

## 2018-12-15 ENCOUNTER — Ambulatory Visit (INDEPENDENT_AMBULATORY_CARE_PROVIDER_SITE_OTHER): Payer: BC Managed Care – PPO | Admitting: Physician Assistant

## 2018-12-15 VITALS — BP 129/95 | HR 98 | Temp 98.3°F | Ht 64.0 in | Wt 277.0 lb

## 2018-12-15 DIAGNOSIS — E038 Other specified hypothyroidism: Secondary | ICD-10-CM | POA: Diagnosis not present

## 2018-12-15 DIAGNOSIS — E559 Vitamin D deficiency, unspecified: Secondary | ICD-10-CM | POA: Diagnosis not present

## 2018-12-15 DIAGNOSIS — Z9189 Other specified personal risk factors, not elsewhere classified: Secondary | ICD-10-CM

## 2018-12-15 DIAGNOSIS — Z6841 Body Mass Index (BMI) 40.0 and over, adult: Secondary | ICD-10-CM

## 2018-12-15 MED ORDER — VITAMIN D (ERGOCALCIFEROL) 1.25 MG (50000 UNIT) PO CAPS: 50000.0000 [IU] | ORAL_CAPSULE | ORAL | 0 refills | Status: DC

## 2018-12-15 NOTE — Progress Notes (Signed)
Office: [phone]  /  Fax: [phone]   HPI:   Chief Complaint: OBESITY Pamela [name] is here to discuss her progress with her obesity treatment plan. She is on the  keep a food journal with 1300-1400 calories and 75 grams of protein  and is following her eating plan approximately 95 % of the time. She states she is exercising 0 minutes 0 times per week. Pamela [name] reports that she has not been getting enough protein in. She has not been eating her vegetables recently due to financial reasons. Her weight is 277 lb (125.6 kg) today and has lost 0 lbs since her last visit. She has lost 21 lbs since starting treatment with Pamela [name].  Vitamin D deficiency Pamela [name] has a diagnosis of vitamin D deficiency. She is currently taking prescription Vit D and denies nausea, vomiting or muscle weakness.  Hypothyroidism  Pamela [name] has a diagnosis of hypothyroidism. She is taking levothyroxine. She denies hot or cold intolerances.  At risk for osteopenia and osteoporosis Pamela [name] is at higher risk of osteopenia and osteoporosis due to vitamin D deficiency.   ASSESSMENT AND PLAN:  Vitamin D deficiency - Plan: Vitamin D, Ergocalciferol, ([name]) 1.25 MG (50000 UT) CAPS capsule  Other specified hypothyroidism  At risk for osteoporosis  Class 3 severe obesity with serious comorbidity and body mass index (BMI) of 45.0 to 49.9 in adult, unspecified obesity type (HCC)  PLAN:  Vitamin D Deficiency Pamela [name] was informed that low vitamin D levels contributes to fatigue and are associated with obesity, breast, and colon cancer. She agrees to continue to take prescription Vit D @50 ,000 IU every week #4 with no refills and will follow up for routine testing of vitamin D, at least 2-3 times per year. She was informed of the risk of over-replacement of vitamin D and agrees to not increase her dose unless she discusses this with Pamela [name] first. Pamela [name] agrees to follow up with our clinic in 3 weeks.  Hypothyroidism  Pamela [name] was  informed of the importance of good thyroid control to help with weight loss efforts. She was also informed that supratherapeutic thyroid levels are dangerous and will not improve weight loss results. Pamela [name] agrees to continue taking levothyroxine and to follow up with our clinic in 3 weeks.  At risk for osteopenia and osteoporosis Pamela [name] was given extended  (15 minutes) osteoporosis prevention counseling today. Pamela [name] is at risk for osteopenia and osteoporosis due to her vitamin D deficiency. She was encouraged to take her vitamin D and follow her higher calcium diet and increase strengthening exercise to help strengthen her bones and decrease her risk of osteopenia and osteoporosis.  Obesity Pamela [name] is currently in the action stage of change. As such, her goal is to continue with weight loss efforts She has agreed to keep a food journal with 1300-1400 calories and 75 protein daily Pamela [name] has been instructed to work up to a goal of 150 minutes of combined cardio and strengthening exercise per week for weight loss and overall health benefits. We discussed the following Behavioral Modification Strategies today: increasing lean protein intake and work on meal planning and easy cooking plans  Pamela [name] has agreed to follow up with our clinic in 3 weeks. She was informed of the importance of frequent follow up visits to maximize her success with intensive lifestyle modifications for her multiple health conditions.  ALLERGIES: No Known Allergies  MEDICATIONS: Current Outpatient Medications on File Prior to Visit  Medication Sig Dispense Refill  . albuterol (2.5 MG/3ML) 0.083% NEBU 3  mL, albuterol (5 MG/ML) 0.5% NEBU 0.5 mL Inhale 3 mg into the lungs as needed.    [name] albuterol (PROVENTIL HFA;VENTOLIN HFA) 108 (90 Base) MCG/ACT inhaler Inhale 1-2 puffs into the lungs every 6 (six) hours as needed for wheezing or shortness of breath. 1 Inhaler 3  . citalopram (CELEXA) 20 MG tablet Take 20 mg by mouth  daily.    . clobetasol (TEMOVATE) 0.05 % external solution Apply 1 application topically 2 (two) times daily.    . Fluticasone-Salmeterol (WIXELA INHUB) 500-50 MCG/DOSE AEPB Inhale 1 puff into the lungs 2 (two) times daily.    [name] ketoconazole (NIZORAL) 2 % cream APPLY TO AREA ON LEFT ARM AND LEFT LEG DAILY FOR 14 DAYS 30 g 0  . levothyroxine (SYNTHROID, LEVOTHROID) 150 MCG tablet Take 1 tablet (150 mcg total) by mouth daily before breakfast. 30 tablet 2  . modafinil (PROVIGIL) 200 MG tablet Take 200 mg by mouth daily.    [name] omeprazole (PRILOSEC) 40 MG capsule Take 40 mg by mouth daily.    . pseudoephedrine (SUDAFED) 30 MG tablet Take 1 tablet (30 mg total) by mouth every 6 (six) hours as needed for congestion. 20 tablet 0  . umeclidinium bromide (INCRUSE ELLIPTA) 62.5 MCG/INH AEPB Inhale 1 puff into the lungs daily. 30 each 5  . zolpidem (AMBIEN) 10 MG tablet Take 10 mg by mouth at bedtime as needed for sleep.     No current facility-administered medications on file prior to visit.     PAST MEDICAL HISTORY: Past Medical History:  Diagnosis Date  . Alcohol abuse   . Anemia   . Asthma   . Blood in stool   . Colon polyps   . COPD (chronic obstructive pulmonary disease) (HCC)   . Depression   . Depression   . GERD (gastroesophageal reflux disease)   . Heart murmur   . History of alcohol abuse   . History of blood transfusion 2006  . HLD (hyperlipidemia)   . Obesity   . Sleep apnea   . Thyroid disease     PAST SURGICAL HISTORY: Past Surgical History:  Procedure Laterality Date  . AUGMENTATION MAMMAPLASTY    . BREAST BIOPSY    . LAPAROSCOPIC GASTRIC BANDING  2010    SOCIAL HISTORY: Social History   Tobacco Use  . Smoking status: Former Games developer  . Smokeless tobacco: Never Used  Substance Use Topics  . Alcohol use: No    Frequency: Never  . Drug use: No    FAMILY HISTORY: Family History  Problem Relation Age of Onset  . Alcohol abuse Mother   . Depression Mother   .  Early death Mother   . Liver disease Mother   . Alcohol abuse Father   . Obesity Father   . Cancer Father     ROS: Review of Systems  Constitutional: Negative for weight loss.  Gastrointestinal: Negative for nausea and vomiting.  Musculoskeletal:       Negative for muscle weakness  Endo/Heme/Allergies:       Negative for hot/cold intolerance     PHYSICAL EXAM: Blood pressure (!) 129/95, pulse 98, temperature 98.3 F (36.8 C), temperature source Oral, height 5\' 4"  (1.626 m), weight 277 lb (125.6 kg), SpO2 93 %. Body mass index is 47.55 kg/m. Physical Exam Vitals signs reviewed.  Constitutional:      Appearance: Normal appearance. She is obese.  Cardiovascular:     Rate and Rhythm: Normal rate.     Pulses: Normal pulses.  Pulmonary:     Effort: Pulmonary effort is normal.  Musculoskeletal: Normal range of motion.  Skin:    General: Skin is warm and dry.  Neurological:     Mental Status: She is alert and oriented to person, place, and time.  Psychiatric:        Mood and Affect: Mood normal.        Behavior: Behavior normal.     RECENT LABS AND TESTS: BMET    Component Value Date/Time   NA 143 09/25/2018 0949   K 4.7 09/25/2018 0949   CL 103 09/25/2018 0949   CO2 23 09/25/2018 0949   GLUCOSE 103 (H) 09/25/2018 0949   GLUCOSE 108 (H) 05/23/2018 0853   BUN 11 09/25/2018 0949   CREATININE 0.67 09/25/2018 0949   CALCIUM 9.2 09/25/2018 0949   GFRNONAA 97 09/25/2018 0949   GFRAA 112 09/25/2018 0949   Lab Results  Component Value Date   HGBA1C 5.9 (H) 09/25/2018   HGBA1C 6.0 (H) 06/23/2018   Lab Results  Component Value Date   INSULIN 13.5 09/25/2018   INSULIN 10.9 06/05/2018   CBC    Component Value Date/Time   WBC 6.2 06/05/2018 0937   RBC 5.23 06/05/2018 0937   HGB 15.2 06/05/2018 0937   HCT 45.3 06/05/2018 0937   MCV 87 06/05/2018 0937   MCH 29.1 06/05/2018 0937   MCHC 33.6 06/05/2018 0937   RDW 15.2 06/05/2018 0937   LYMPHSABS 2.1 06/05/2018  0937   EOSABS 0.1 06/05/2018 0937   BASOSABS 0.1 06/05/2018 0937   Iron/TIBC/Ferritin/ %Sat No results found for: IRON, TIBC, FERRITIN, IRONPCTSAT Lipid Panel     Component Value Date/Time   CHOL 237 (H) 09/25/2018 0949   TRIG 99 09/25/2018 0949   HDL 46 09/25/2018 0949   CHOLHDL 5 05/23/2018 0853   VLDL 16.6 05/23/2018 0853   LDLCALC 171 (H) 09/25/2018 0949   Hepatic Function Panel     Component Value Date/Time   PROT 6.3 09/25/2018 0949   ALBUMIN 4.1 09/25/2018 0949   AST 19 09/25/2018 0949   ALT 18 09/25/2018 0949   ALKPHOS 86 09/25/2018 0949   BILITOT 0.3 09/25/2018 0949      Component Value Date/Time   TSH 4.450 06/05/2018 0937   TSH 3.47 01/27/2018 1143     Ref. Range 09/25/2018 09:49  Vitamin D, 25-Hydroxy Latest Ref Range: 30.0 - 100.0 ng/mL 38.4     OBESITY BEHAVIORAL INTERVENTION VISIT  Today's visit was # 9   Starting weight: 298 lbs Starting date: 06/05/2018 Today's weight :: 277 lbs Today's date: 12/15/2018 Total lbs lost to date: 21  ASK: We discussed the diagnosis of obesity with Pamela [name] today and Pamela [name] agreed to give [name] permission to discuss obesity behavioral modification therapy today.  ASSESS: Emelie has the diagnosis of obesity and her BMI today is 48.52 Pamela [name] is in the action stage of change   ADVISE: Eufemia was educated on the multiple health risks of obesity as well as the benefit of weight loss to improve her health. She was advised of the need for long term treatment and the importance of lifestyle modifications to improve her current health and to decrease her risk of future health problems.  AGREE: Multiple dietary modification options and treatment options were discussed and  Pamela [name] agreed to follow the recommendations documented in the above note.  ARRANGE: Pamela [name] was educated on the importance of frequent visits to treat obesity as outlined per CMS and USPSTF guidelines and  agreed to schedule her next follow  up appointment today.  I, Tammy Wysor, am acting as Energy managertranscriptionist for [employer]y Cartier Mapel, PA-C I, Alois Clicheracey Mirely Pangle, PA-C have reviewed above note and agree with its content

## 2019-01-06 ENCOUNTER — Other Ambulatory Visit (HOSPITAL_BASED_OUTPATIENT_CLINIC_OR_DEPARTMENT_OTHER): Payer: Self-pay | Admitting: Family Medicine

## 2019-01-06 DIAGNOSIS — Z1231 Encounter for screening mammogram for malignant neoplasm of breast: Secondary | ICD-10-CM

## 2019-01-12 ENCOUNTER — Ambulatory Visit (INDEPENDENT_AMBULATORY_CARE_PROVIDER_SITE_OTHER): Payer: BC Managed Care – PPO | Admitting: Physician Assistant

## 2019-01-12 ENCOUNTER — Encounter (INDEPENDENT_AMBULATORY_CARE_PROVIDER_SITE_OTHER): Payer: Self-pay | Admitting: Physician Assistant

## 2019-01-12 VITALS — BP 144/89 | HR 88 | Temp 97.9°F | Ht 64.0 in | Wt 274.0 lb

## 2019-01-12 DIAGNOSIS — E66813 Obesity, class 3: Secondary | ICD-10-CM

## 2019-01-12 DIAGNOSIS — Z6841 Body Mass Index (BMI) 40.0 and over, adult: Secondary | ICD-10-CM | POA: Diagnosis not present

## 2019-01-12 DIAGNOSIS — Z9189 Other specified personal risk factors, not elsewhere classified: Secondary | ICD-10-CM

## 2019-01-12 DIAGNOSIS — E7849 Other hyperlipidemia: Secondary | ICD-10-CM

## 2019-01-12 DIAGNOSIS — E559 Vitamin D deficiency, unspecified: Secondary | ICD-10-CM | POA: Diagnosis not present

## 2019-01-12 MED ORDER — VITAMIN D (ERGOCALCIFEROL) 1.25 MG (50000 UNIT) PO CAPS: 50000.0000 [IU] | ORAL_CAPSULE | ORAL | 0 refills | Status: DC

## 2019-01-12 NOTE — Progress Notes (Signed)
Office: [phone]  /  Fax: [phone]   HPI:   Chief Complaint: OBESITY Pamela [name] is here to discuss her progress with her obesity treatment plan. She is keeping a food journal with 1300-1400 calories and 75 grams of protein daily and is following her eating plan approximately 50% of the time. She states she is exercising 0 minutes 0 times per week. Pamela [name] did well with weight loss. She recently lost her dog and her appetite has been decreased. She has been making herself eat.  Her weight is 274 lb (124.3 kg) today and has had a weight loss of 3 pounds over a period of 4 weeks since her last visit. She has lost 24 lbs since starting treatment with [name].  Vitamin D deficiency Pamela [name] has a diagnosis of Vitamin D deficiency. She is currently taking prescription Vit D and denies nausea, vomiting or muscle weakness.  Hyperlipidemia Pamela [name] has hyperlipidemia and has been trying to improve her cholesterol levels with intensive lifestyle modification including a low saturated fat diet, exercise and weight loss. Pamela [name] is on no medications and denies any chest pain.  At risk for cardiovascular disease Pamela [name] is at a higher than average risk for cardiovascular disease due to obesity. She currently denies any chest pain.  ASSESSMENT AND PLAN:  Vitamin D deficiency - Plan: Vitamin D, Ergocalciferol, ([name]) 1.25 MG (50000 UT) CAPS capsule  Other hyperlipidemia  At risk for heart disease  Class 3 severe obesity with serious comorbidity and body mass index (BMI) of 45.0 to 49.9 in adult, unspecified obesity type (HCC)  PLAN:  Vitamin D Deficiency Pamela [name] was informed that low Vitamin D levels contributes to fatigue and are associated with obesity, breast, and colon cancer. Pamela [name] agrees to continue to take prescription Vit D @ 50,000 IU every week #4 with no refills and will follow-up for routine testing of Vitamin D, at least 2-3 times per year. Pamela [name] was informed of the risk of  over-replacement of Vitamin D and agrees to not increase her dose unless she discusses this with [name] first. Pamela [name] agrees to follow-up with our clinic in 3 weeks.  Hyperlipidemia Pamela [name] was informed of the American Heart Association Guidelines emphasizing intensive lifestyle modifications as the first line treatment for hyperlipidemia. We discussed many lifestyle modifications today in depth, and Pamela [name] will continue to work on decreasing saturated fats such as fatty red meat, butter and many fried foods. She will also increase vegetables and lean protein in her diet and continue to work on exercise and weight loss efforts.  Cardiovascular risk counseling Pamela [name] was given extended (15 minutes) coronary artery disease prevention counseling today. She is 58 y.o. female and has risk factors for heart disease including obesity. We discussed intensive lifestyle modifications today with an emphasis on specific weight loss instructions and strategies. Pt was also informed of the importance of increasing exercise and decreasing saturated fats to help prevent heart disease.  Obesity Pamela [name] is currently in the action stage of change. As such, her goal is to continue with weight loss efforts. She has agreed to keep a food journal with 1300-1400 calories and 75 grams of  protein daily. Pamela [name] has been instructed to work up to a goal of 150 minutes of combined cardio and strengthening exercise per week for weight loss and overall health benefits. We discussed the following Behavioral Modification Strategies today: increasing lean protein intake and work on meal planning and easy cooking plans.  Pamela [name] has agreed to follow up with our clinic in 3  weeks. She was informed of the importance of frequent follow up visits to maximize her success with intensive lifestyle modifications for her multiple health conditions.  ALLERGIES: No Known Allergies  MEDICATIONS: Current Outpatient Medications on File Prior  to Visit  Medication Sig Dispense Refill  . albuterol (2.5 MG/3ML) 0.083% NEBU 3 mL, albuterol (5 MG/ML) 0.5% NEBU 0.5 mL Inhale 3 mg into the lungs as needed.    [name] albuterol (PROVENTIL HFA;VENTOLIN HFA) 108 (90 Base) MCG/ACT inhaler Inhale 1-2 puffs into the lungs every 6 (six) hours as needed for wheezing or shortness of breath. 1 Inhaler 3  . citalopram (CELEXA) 20 MG tablet Take 20 mg by mouth daily.    . clobetasol (TEMOVATE) 0.05 % external solution Apply 1 application topically 2 (two) times daily.    . Fluticasone-Salmeterol (WIXELA INHUB) 500-50 MCG/DOSE AEPB Inhale 1 puff into the lungs 2 (two) times daily.    [name] ketoconazole (NIZORAL) 2 % cream APPLY TO AREA ON LEFT ARM AND LEFT LEG DAILY FOR 14 DAYS 30 g 0  . levothyroxine (SYNTHROID, LEVOTHROID) 150 MCG tablet Take 1 tablet (150 mcg total) by mouth daily before breakfast. 30 tablet 2  . modafinil (PROVIGIL) 200 MG tablet Take 200 mg by mouth daily.    [name] omeprazole (PRILOSEC) 40 MG capsule Take 40 mg by mouth daily.    . pseudoephedrine (SUDAFED) 30 MG tablet Take 1 tablet (30 mg total) by mouth every 6 (six) hours as needed for congestion. 20 tablet 0  . umeclidinium bromide (INCRUSE ELLIPTA) 62.5 MCG/INH AEPB Inhale 1 puff into the lungs daily. 30 each 5  . zolpidem (AMBIEN) 10 MG tablet Take 10 mg by mouth at bedtime as needed for sleep.     No current facility-administered medications on file prior to visit.     PAST MEDICAL HISTORY: Past Medical History:  Diagnosis Date  . Alcohol abuse   . Anemia   . Asthma   . Blood in stool   . Colon polyps   . COPD (chronic obstructive pulmonary disease) (HCC)   . Depression   . Depression   . GERD (gastroesophageal reflux disease)   . Heart murmur   . History of alcohol abuse   . History of blood transfusion 2006  . HLD (hyperlipidemia)   . Obesity   . Sleep apnea   . Thyroid disease     PAST SURGICAL HISTORY: Past Surgical History:  Procedure Laterality Date  .  AUGMENTATION MAMMAPLASTY    . BREAST BIOPSY    . LAPAROSCOPIC GASTRIC BANDING  2010    SOCIAL HISTORY: Social History   Tobacco Use  . Smoking status: Former Games developer  . Smokeless tobacco: Never Used  Substance Use Topics  . Alcohol use: No    Frequency: Never  . Drug use: No    FAMILY HISTORY: Family History  Problem Relation Age of Onset  . Alcohol abuse Mother   . Depression Mother   . Early death Mother   . Liver disease Mother   . Alcohol abuse Father   . Obesity Father   . Cancer Father    ROS: Review of Systems  Constitutional: Positive for weight loss.  Cardiovascular: Negative for chest pain.  Gastrointestinal: Negative for nausea and vomiting.  Musculoskeletal:       Negative for muscle weakness.  Endo/Heme/Allergies:       Negative for hypoglycemia.   PHYSICAL EXAM: Blood pressure (!) 144/89, pulse 88, temperature 97.9 F (36.6 C), temperature source Oral,  height 5\' 4"  (1.626 m), weight 274 lb (124.3 kg), SpO2 93 %. Body mass index is 47.03 kg/m. Physical Exam Vitals signs reviewed.  Constitutional:      Appearance: Normal appearance. She is obese.  Cardiovascular:     Rate and Rhythm: Normal rate.     Pulses: Normal pulses.  Pulmonary:     Effort: Pulmonary effort is normal.     Breath sounds: Normal breath sounds.  Musculoskeletal: Normal range of motion.  Skin:    General: Skin is warm and dry.  Neurological:     Mental Status: She is alert and oriented to person, place, and time.  Psychiatric:        Behavior: Behavior normal.   RECENT LABS AND TESTS: BMET    Component Value Date/Time   NA 143 09/25/2018 0949   K 4.7 09/25/2018 0949   CL 103 09/25/2018 0949   CO2 23 09/25/2018 0949   GLUCOSE 103 (H) 09/25/2018 0949   GLUCOSE 108 (H) 05/23/2018 0853   BUN 11 09/25/2018 0949   CREATININE 0.67 09/25/2018 0949   CALCIUM 9.2 09/25/2018 0949   GFRNONAA 97 09/25/2018 0949   GFRAA 112 09/25/2018 0949   Lab Results  Component Value  Date   HGBA1C 5.9 (H) 09/25/2018   HGBA1C 6.0 (H) 06/23/2018   Lab Results  Component Value Date   INSULIN 13.5 09/25/2018   INSULIN 10.9 06/05/2018   CBC    Component Value Date/Time   WBC 6.2 06/05/2018 0937   RBC 5.23 06/05/2018 0937   HGB 15.2 06/05/2018 0937   HCT 45.3 06/05/2018 0937   MCV 87 06/05/2018 0937   MCH 29.1 06/05/2018 0937   MCHC 33.6 06/05/2018 0937   RDW 15.2 06/05/2018 0937   LYMPHSABS 2.1 06/05/2018 0937   EOSABS 0.1 06/05/2018 0937   BASOSABS 0.1 06/05/2018 0937   Iron/TIBC/Ferritin/ %Sat No results found for: IRON, TIBC, FERRITIN, IRONPCTSAT Lipid Panel     Component Value Date/Time   CHOL 237 (H) 09/25/2018 0949   TRIG 99 09/25/2018 0949   HDL 46 09/25/2018 0949   CHOLHDL 5 05/23/2018 0853   VLDL 16.6 05/23/2018 0853   LDLCALC 171 (H) 09/25/2018 0949   Hepatic Function Panel     Component Value Date/Time   PROT 6.3 09/25/2018 0949   ALBUMIN 4.1 09/25/2018 0949   AST 19 09/25/2018 0949   ALT 18 09/25/2018 0949   ALKPHOS 86 09/25/2018 0949   BILITOT 0.3 09/25/2018 0949      Component Value Date/Time   TSH 4.450 06/05/2018 0937   TSH 3.47 01/27/2018 1143    Ref. Range 09/25/2018 09:49  Vitamin D, 25-Hydroxy Latest Ref Range: 30.0 - 100.0 ng/mL 38.4   OBESITY BEHAVIORAL INTERVENTION VISIT  Today's visit was #10  Starting weight: 298 lbs Starting date: 06/05/2018 Today's weight: 274 lbs  Today's date: 01/12/2019 Total lbs lost to date: 24  ASK: We discussed the diagnosis of obesity with Pamela [name] today and Pamela [name] agreed to give us permission to discuss obesity behavioral modification therapy today.  ASSESS: Pamela [name] has the diagnosis of obesity and her BMI today is 47.03. Pamela [name] is in the action stage of change.   ADVISE: Pamela [name] was educated on the multiple health risks of obesity as well as the benefit of weight loss to improve her health. She was advised of the need for long term treatment and the importance of  lifestyle modifications to improve her current health and to decrease her risk of future health  problems.  AGREE: Multiple dietary modification options and treatment options were discussed and  Aysha agreed to follow the recommendations documented in the above note.  ARRANGE: Makaya was educated on the importance of frequent visits to treat obesity as outlined per CMS and USPSTF guidelines and agreed to schedule her next follow up appointment today.  Pamela [name], am acting as transcriptionist for Alois Cliche, PA-C I, Alois Cliche, PA-C have reviewed above note and agree with its content

## 2019-01-26 ENCOUNTER — Encounter (INDEPENDENT_AMBULATORY_CARE_PROVIDER_SITE_OTHER): Payer: Self-pay | Admitting: Physician Assistant

## 2019-01-26 NOTE — Telephone Encounter (Signed)
Pamela [name], can you contact this patient?

## 2019-02-02 ENCOUNTER — Encounter (HOSPITAL_BASED_OUTPATIENT_CLINIC_OR_DEPARTMENT_OTHER): Payer: Self-pay

## 2019-02-02 ENCOUNTER — Encounter (INDEPENDENT_AMBULATORY_CARE_PROVIDER_SITE_OTHER): Payer: Self-pay | Admitting: Physician Assistant

## 2019-02-02 ENCOUNTER — Ambulatory Visit (HOSPITAL_BASED_OUTPATIENT_CLINIC_OR_DEPARTMENT_OTHER)
Admission: RE | Admit: 2019-02-02 | Discharge: 2019-02-02 | Disposition: A | Discharge status: Home / Self Care | Payer: BC Managed Care – PPO | Source: Ambulatory Visit | Attending: Family Medicine | Admitting: Family Medicine

## 2019-02-02 ENCOUNTER — Ambulatory Visit (INDEPENDENT_AMBULATORY_CARE_PROVIDER_SITE_OTHER): Payer: BC Managed Care – PPO | Admitting: Physician Assistant

## 2019-02-02 VITALS — BP 143/98 | HR 83 | Temp 97.8°F | Ht 64.0 in | Wt 270.0 lb

## 2019-02-02 DIAGNOSIS — E7849 Other hyperlipidemia: Secondary | ICD-10-CM | POA: Diagnosis not present

## 2019-02-02 DIAGNOSIS — E559 Vitamin D deficiency, unspecified: Secondary | ICD-10-CM

## 2019-02-02 DIAGNOSIS — Z9189 Other specified personal risk factors, not elsewhere classified: Secondary | ICD-10-CM | POA: Diagnosis not present

## 2019-02-02 DIAGNOSIS — Z1231 Encounter for screening mammogram for malignant neoplasm of breast: Secondary | ICD-10-CM

## 2019-02-02 DIAGNOSIS — R7303 Prediabetes: Secondary | ICD-10-CM | POA: Diagnosis not present

## 2019-02-02 DIAGNOSIS — Z6841 Body Mass Index (BMI) 40.0 and over, adult: Secondary | ICD-10-CM

## 2019-02-02 MED ORDER — VITAMIN D (ERGOCALCIFEROL) 1.25 MG (50000 UNIT) PO CAPS: 50000.0000 [IU] | ORAL_CAPSULE | ORAL | 0 refills | Status: DC

## 2019-02-02 NOTE — Progress Notes (Signed)
Office: [phone]  /  Fax: [phone]   HPI:   Chief Complaint: OBESITY Pamela [name] is here to discuss her progress with her obesity treatment plan. She is keeping a food journal with 1300-1400 calories and 75 grams of protein and is following her eating plan approximately 100% of the time. She states she is doing water aerobics 60 minutes 3 times per week. Pamela [name] did well with weight loss. She continues to struggle to get all of her protein in daily. Her weight is 270 lb (122.5 kg) today and has had a weight loss of 4 pounds over a period of 3 weeks since her last visit. She has lost 28 lbs since starting treatment with [name].  Vitamin D deficiency Pamela [name] has a diagnosis of Vitamin D deficiency. She is currently taking prescription Vit D and denies nausea, vomiting or muscle weakness.  At risk for osteopenia and osteoporosis Pamela [name] is at higher risk of osteopenia and osteoporosis due to Vitamin D deficiency.   Hyperlipidemia Pamela [name] has hyperlipidemia and has been trying to improve her cholesterol levels with intensive lifestyle modification including a low saturated fat diet, exercise and weight loss. She is on no medication and denies any chest pain.  Pre-Diabetes Pamela [name] has a diagnosis of prediabetes based on her elevated Hgb A1c and was informed this puts her at greater risk of developing diabetes. She continues to work on diet and exercise to decrease risk of diabetes. She denies medications or polyphagia.  ASSESSMENT AND PLAN:  Vitamin D deficiency - Plan: Vitamin D, Ergocalciferol, ([name]) 1.25 MG (50000 UT) CAPS capsule, VITAMIN D 25 Hydroxy (Vit-D Deficiency, Fractures)  Other hyperlipidemia - Plan: Lipid Panel With LDL/HDL Ratio  Prediabetes - Plan: Comprehensive metabolic panel, Hemoglobin A1c, Insulin, random  At risk for osteoporosis  Class 3 severe obesity with serious comorbidity and body mass index (BMI) of 45.0 to 49.9 in adult, unspecified obesity type  (HCC)  PLAN:  Vitamin D Deficiency Pamela [name] was informed that low Vitamin D levels contributes to fatigue and are associated with obesity, breast, and colon cancer. She agrees to continue to take prescription Vit D @ 50,000 IU every week #4 with 0 refills and will have routine testing of Vitamin D today. She was informed of the risk of over-replacement of Vitamin D and agrees to not increase her dose unless she discusses this with [name] first. Pamela [name] agrees to follow-up with our clinic in 2-3 weeks.  At risk for osteopenia and osteoporosis Pamela [name] was given extended (15 minutes) osteoporosis prevention counseling today. Pamela [name] is at risk for osteopenia and osteoporsis due to her Vitamin D deficiency. She was encouraged to take her Vitamin D and follow her higher calcium diet and increase strengthening exercise to help strengthen her bones and decrease her risk of osteopenia and osteoporosis.  Hyperlipidemia Pamela [name] was informed of the American Heart Association Guidelines emphasizing intensive lifestyle modifications as the first line treatment for hyperlipidemia. We discussed many lifestyle modifications today in depth, and Pamela [name] will continue to work on decreasing saturated fats such as fatty red meat, butter and many fried foods. She will also increase vegetables and lean protein in her diet and continue to work on exercise and weight loss efforts.  Pre-Diabetes Pamela [name] will continue to work on weight loss, exercise, and decreasing simple carbohydrates in her diet to help decrease the risk of diabetes. We dicussed metformin including benefits and risks. She was informed that eating too many simple carbohydrates or too many calories at one sitting increases the likelihood  of GI side effects. Pamela [name] will have labs drawn today and agrees to follow-up with [name] as directed to monitor her progress.  Obesity Pamela [name] is currently in the action stage of change. As such, her goal is to continue with  weight loss efforts. She has agreed to keep a food journal with 1300-1400 calories and 75 grams of protein daily. Pamela [name] has been instructed to work up to a goal of 150 minutes of combined cardio and strengthening exercise per week for weight loss and overall health benefits. We discussed the following Behavioral Modification Strategies today: increasing lean protein intake and work on meal planning and easy cooking plans.  Pamela [name] has agreed to follow-up with our clinic in 2-3 weeks. She was informed of the importance of frequent follow up visits to maximize her success with intensive lifestyle modifications for her multiple health conditions.  ALLERGIES: No Known Allergies  MEDICATIONS: Current Outpatient Medications on File Prior to Visit  Medication Sig Dispense Refill  . albuterol (2.5 MG/3ML) 0.083% NEBU 3 mL, albuterol (5 MG/ML) 0.5% NEBU 0.5 mL Inhale 3 mg into the lungs as needed.    [name] albuterol (PROVENTIL HFA;VENTOLIN HFA) 108 (90 Base) MCG/ACT inhaler Inhale 1-2 puffs into the lungs every 6 (six) hours as needed for wheezing or shortness of breath. 1 Inhaler 3  . citalopram (CELEXA) 20 MG tablet Take 20 mg by mouth daily.    . clobetasol (TEMOVATE) 0.05 % external solution Apply 1 application topically 2 (two) times daily.    . Fluticasone-Salmeterol (WIXELA INHUB) 500-50 MCG/DOSE AEPB Inhale 1 puff into the lungs 2 (two) times daily.    [name] ketoconazole (NIZORAL) 2 % cream APPLY TO AREA ON LEFT ARM AND LEFT LEG DAILY FOR 14 DAYS 30 g 0  . levothyroxine (SYNTHROID, LEVOTHROID) 150 MCG tablet Take 1 tablet (150 mcg total) by mouth daily before breakfast. 30 tablet 2  . modafinil (PROVIGIL) 200 MG tablet Take 200 mg by mouth daily.    [name] omeprazole (PRILOSEC) 40 MG capsule Take 40 mg by mouth daily.    . pseudoephedrine (SUDAFED) 30 MG tablet Take 1 tablet (30 mg total) by mouth every 6 (six) hours as needed for congestion. 20 tablet 0  . umeclidinium bromide (INCRUSE ELLIPTA) 62.5  MCG/INH AEPB Inhale 1 puff into the lungs daily. 30 each 5  . zolpidem (AMBIEN) 10 MG tablet Take 10 mg by mouth at bedtime as needed for sleep.     No current facility-administered medications on file prior to visit.     PAST MEDICAL HISTORY: Past Medical History:  Diagnosis Date  . Alcohol abuse   . Anemia   . Asthma   . Blood in stool   . Colon polyps   . COPD (chronic obstructive pulmonary disease) (HCC)   . Depression   . Depression   . GERD (gastroesophageal reflux disease)   . Heart murmur   . History of alcohol abuse   . History of blood transfusion 2006  . HLD (hyperlipidemia)   . Obesity   . Sleep apnea   . Thyroid disease     PAST SURGICAL HISTORY: Past Surgical History:  Procedure Laterality Date  . AUGMENTATION MAMMAPLASTY    . BREAST BIOPSY    . LAPAROSCOPIC GASTRIC BANDING  2010    SOCIAL HISTORY: Social History   Tobacco Use  . Smoking status: Former Games developer  . Smokeless tobacco: Never Used  Substance Use Topics  . Alcohol use: No    Frequency: Never  .  Drug use: No    FAMILY HISTORY: Family History  Problem Relation Age of Onset  . Alcohol abuse Mother   . Depression Mother   . Early death Mother   . Liver disease Mother   . Alcohol abuse Father   . Obesity Father   . Cancer Father    ROS: Review of Systems  Constitutional: Positive for weight loss.  Cardiovascular: Negative for chest pain.  Gastrointestinal: Negative for nausea and vomiting.  Musculoskeletal:       Negative for muscle weakness.  Endo/Heme/Allergies:       Negative for polyphagia. Negative for hypoglycemia.   PHYSICAL EXAM: Blood pressure (!) 143/98, pulse 83, temperature 97.8 F (36.6 C), height 5\' 4"  (1.626 m), weight 270 lb (122.5 kg), SpO2 94 %. Body mass index is 46.35 kg/m. Physical Exam Vitals signs reviewed.  Constitutional:      Appearance: Normal appearance. She is obese.  Cardiovascular:     Rate and Rhythm: Normal rate.     Pulses: Normal  pulses.  Pulmonary:     Effort: Pulmonary effort is normal.     Breath sounds: Normal breath sounds.  Musculoskeletal: Normal range of motion.  Skin:    General: Skin is warm and dry.  Neurological:     Mental Status: She is alert and oriented to person, place, and time.  Psychiatric:        Behavior: Behavior normal.   RECENT LABS AND TESTS: BMET    Component Value Date/Time   NA 143 09/25/2018 0949   K 4.7 09/25/2018 0949   CL 103 09/25/2018 0949   CO2 23 09/25/2018 0949   GLUCOSE 103 (H) 09/25/2018 0949   GLUCOSE 108 (H) 05/23/2018 0853   BUN 11 09/25/2018 0949   CREATININE 0.67 09/25/2018 0949   CALCIUM 9.2 09/25/2018 0949   GFRNONAA 97 09/25/2018 0949   GFRAA 112 09/25/2018 0949   Lab Results  Component Value Date   HGBA1C 5.9 (H) 09/25/2018   HGBA1C 6.0 (H) 06/23/2018   Lab Results  Component Value Date   INSULIN 13.5 09/25/2018   INSULIN 10.9 06/05/2018   CBC    Component Value Date/Time   WBC 6.2 06/05/2018 0937   RBC 5.23 06/05/2018 0937   HGB 15.2 06/05/2018 0937   HCT 45.3 06/05/2018 0937   MCV 87 06/05/2018 0937   MCH 29.1 06/05/2018 0937   MCHC 33.6 06/05/2018 0937   RDW 15.2 06/05/2018 0937   LYMPHSABS 2.1 06/05/2018 0937   EOSABS 0.1 06/05/2018 0937   BASOSABS 0.1 06/05/2018 0937   Iron/TIBC/Ferritin/ %Sat No results found for: IRON, TIBC, FERRITIN, IRONPCTSAT Lipid Panel     Component Value Date/Time   CHOL 237 (H) 09/25/2018 0949   TRIG 99 09/25/2018 0949   HDL 46 09/25/2018 0949   CHOLHDL 5 05/23/2018 0853   VLDL 16.6 05/23/2018 0853   LDLCALC 171 (H) 09/25/2018 0949   Hepatic Function Panel     Component Value Date/Time   PROT 6.3 09/25/2018 0949   ALBUMIN 4.1 09/25/2018 0949   AST 19 09/25/2018 0949   ALT 18 09/25/2018 0949   ALKPHOS 86 09/25/2018 0949   BILITOT 0.3 09/25/2018 0949      Component Value Date/Time   TSH 4.450 06/05/2018 0937   TSH 3.47 01/27/2018 1143    Ref. Range 09/25/2018 09:49  Vitamin D,  25-Hydroxy Latest Ref Range: 30.0 - 100.0 ng/mL 38.4   OBESITY BEHAVIORAL INTERVENTION VISIT  Today's visit was #11  Starting weight: 298  lbs Starting date: 06/05/2018 Today's weight: 270 lbs Today's date: 02/02/2019 Total lbs lost to date: 28    02/02/2019  Height 5\' 4"  (1.626 m)  Weight 270 lb (122.5 kg)  BMI (Calculated) 46.32  BLOOD PRESSURE - SYSTOLIC 143  BLOOD PRESSURE - DIASTOLIC 98   Body Fat % 53.3 %  Total Body Water (lbs) 84.2 lbs   ASK: We discussed the diagnosis of obesity with Pamela [name] today and Pamela [name] agreed to give us permission to discuss obesity behavioral modification therapy today.  ASSESS: Pamela [name] has the diagnosis of obesity and her BMI today is 46.32. Pamela [name] is in the action stage of change.   ADVISE: Pamela [name] was educated on the multiple health risks of obesity as well as the benefit of weight loss to improve her health. She was advised of the need for long term treatment and the importance of lifestyle modifications to improve her current health and to decrease her risk of future health problems.  AGREE: Multiple dietary modification options and treatment options were discussed and  Rowen agreed to follow the recommendations documented in the above note.  ARRANGE: Pamela [name] was educated on the importance of frequent visits to treat obesity as outlined per CMS and USPSTF guidelines and agreed to schedule her next follow up appointment today.  Fernanda DrumI, Denise Haag, am acting as transcriptionist for Alois Clicheracey Marieann Zipp, PA-C I, Alois Clicheracey Carianna Lague, PA-C have reviewed above note and agree with its content

## 2019-02-03 LAB — COMPREHENSIVE METABOLIC PANEL
ALT: 16 IU/L (ref 0–32)
AST: 17 IU/L (ref 0–40)
Albumin/Globulin Ratio: 1.7 (ref 1.2–2.2)
Albumin: 4.2 g/dL (ref 3.8–4.9)
Alkaline Phosphatase: 98 IU/L (ref 39–117)
BUN/Creatinine Ratio: 22 (ref 9–23)
BUN: 14 mg/dL (ref 6–24)
Bilirubin Total: 0.3 mg/dL (ref 0.0–1.2)
CO2: 21 mmol/L (ref 20–29)
Calcium: 9.1 mg/dL (ref 8.7–10.2)
Chloride: 101 mmol/L (ref 96–106)
Creatinine, Ser: 0.65 mg/dL (ref 0.57–1.00)
GFR calc Af Amer: 113 mL/min/{1.73_m2} (ref >59)
GFR calc non Af Amer: 98 mL/min/{1.73_m2} (ref >59)
Globulin, Total: 2.5 g/dL (ref 1.5–4.5)
Glucose: 101 mg/dL — ABNORMAL HIGH (ref 65–99)
Potassium: 4.6 mmol/L (ref 3.5–5.2)
Sodium: 142 mmol/L (ref 134–144)
Total Protein: 6.7 g/dL (ref 6.0–8.5)

## 2019-02-03 LAB — HEMOGLOBIN A1C
Est. average glucose Bld gHb Est-mCnc: 123 mg/dL
Hgb A1c MFr Bld: 5.9 % — ABNORMAL HIGH (ref 4.8–5.6)

## 2019-02-03 LAB — VITAMIN D 25 HYDROXY (VIT D DEFICIENCY, FRACTURES): Vit D, 25-Hydroxy: 46.2 ng/mL (ref 30.0–100.0)

## 2019-02-03 LAB — LIPID PANEL WITH LDL/HDL RATIO
CHOLESTEROL TOTAL: 245 mg/dL — AB (ref 100–199)
HDL: 47 mg/dL (ref >39)
LDL Calculated: 174 mg/dL — ABNORMAL HIGH (ref 0–99)
LDl/HDL Ratio: 3.7 ratio — ABNORMAL HIGH (ref 0.0–3.2)
Triglycerides: 121 mg/dL (ref 0–149)
VLDL Cholesterol Cal: 24 mg/dL (ref 5–40)

## 2019-02-03 LAB — INSULIN, RANDOM: INSULIN: 13.7 u[IU]/mL (ref 2.6–24.9)

## 2019-02-09 ENCOUNTER — Encounter: Payer: Self-pay | Admitting: Family Medicine

## 2019-02-09 DIAGNOSIS — E039 Hypothyroidism, unspecified: Secondary | ICD-10-CM

## 2019-02-09 MED ORDER — LEVOTHYROXINE SODIUM 150 MCG PO TABS: 150.0000 ug | ORAL_TABLET | Freq: Every day | ORAL | 3 refills | Status: DC

## 2019-02-17 ENCOUNTER — Encounter (INDEPENDENT_AMBULATORY_CARE_PROVIDER_SITE_OTHER): Payer: Self-pay

## 2019-02-27 ENCOUNTER — Other Ambulatory Visit: Payer: Self-pay | Admitting: Family Medicine

## 2019-02-27 DIAGNOSIS — B354 Tinea corporis: Secondary | ICD-10-CM

## 2019-03-02 ENCOUNTER — Ambulatory Visit (INDEPENDENT_AMBULATORY_CARE_PROVIDER_SITE_OTHER): Payer: BC Managed Care – PPO | Admitting: Physician Assistant

## 2019-03-02 ENCOUNTER — Encounter (INDEPENDENT_AMBULATORY_CARE_PROVIDER_SITE_OTHER): Payer: Self-pay | Admitting: Physician Assistant

## 2019-03-02 ENCOUNTER — Other Ambulatory Visit: Payer: Self-pay

## 2019-03-02 DIAGNOSIS — Z6841 Body Mass Index (BMI) 40.0 and over, adult: Secondary | ICD-10-CM

## 2019-03-02 DIAGNOSIS — E7849 Other hyperlipidemia: Secondary | ICD-10-CM | POA: Diagnosis not present

## 2019-03-02 DIAGNOSIS — E559 Vitamin D deficiency, unspecified: Secondary | ICD-10-CM | POA: Diagnosis not present

## 2019-03-02 MED ORDER — VITAMIN D (ERGOCALCIFEROL) 1.25 MG (50000 UNIT) PO CAPS: 50000.0000 [IU] | ORAL_CAPSULE | ORAL | 0 refills | Status: DC

## 2019-03-02 NOTE — Progress Notes (Signed)
Office: [phone]  /  Fax: [phone] TeleHealth Visit:  [name] has verbally consented to this TeleHealth visit today. The patient is located at home, the provider is located at the [facility] office. The participants in this visit include the listed provider and patient and any and all parties involved. The visit was conducted today via FaceTime.  HPI:   Chief Complaint: OBESITY Pamela [name] is here to discuss her progress with her obesity treatment plan. She is on the keep a food journal with 1300 to 1400 calories and 75 grams of protein daily plan and is following her eating plan approximately 90 % of the time. She states she is walking for 120 minutes 7 times per week. Rushelle reports that she has been reaching her protein goal almost every other day. We were unable to weigh the patient today for this TeleHealth visit. She feels as if she has maintained weight since her last visit. She has lost 28 lbs since starting treatment with [name].  Vitamin D deficiency [name] has a diagnosis of vitamin D deficiency. [name] is on vit D and she denies nausea, vomiting or muscle weakness.  Hyperlipidemia [name] has hyperlipidemia and she is not on medications. Her last level was not at goal. She has been trying to improve her cholesterol levels with intensive lifestyle modification including a low saturated fat diet, exercise and weight loss. She denies any chest pain.  ASSESSMENT AND PLAN:  Vitamin D deficiency - Plan: Vitamin D, Ergocalciferol, ([name]) 1.25 MG (50000 UT) CAPS capsule  Other hyperlipidemia  Class 3 severe obesity with serious comorbidity and body mass index (BMI) of 45.0 to 49.9 in adult, unspecified obesity type (HCC)  PLAN:  Vitamin D Deficiency [name] was informed that low vitamin D levels contributes to fatigue and are associated with obesity, breast, and colon cancer. She agrees to continue to take prescription Vit D @50 ,000 IU every week  #4 with no refills and will follow up for routine testing of vitamin D, at least 2-3 times per year. She was informed of the risk of over-replacement of vitamin D and agrees to not increase her dose unless she discusses this with [name] first. [name] agrees to follow up as directed.  Hyperlipidemia [name] was informed of the American Heart Association Guidelines emphasizing intensive lifestyle modifications as the first line treatment for hyperlipidemia. We discussed many lifestyle modifications today in depth, and [name] will continue to work on decreasing saturated fats such as fatty red meat, butter and many fried foods. She will also increase vegetables and lean protein in her diet and continue to work on exercise and weight loss efforts.  Obesity [name] is currently in the action stage of change. As such, her goal is to continue with weight loss efforts She has agreed to keep a food journal with 1300 to 1400 calories and 75 grams of protein daily [name] has been instructed to work up to a goal of 150 minutes of combined cardio and strengthening exercise per week for weight loss and overall health benefits. We discussed the following Behavioral Modification Strategies today: no skipping meals and work on meal planning and easy cooking plans  [name] has agreed to follow up with our clinic in 3 weeks. She was informed of the importance of frequent follow up visits to maximize her success with intensive lifestyle modifications for her multiple health conditions.  ALLERGIES: No Known Allergies  MEDICATIONS: Current Outpatient Medications on File Prior to Visit  Medication Sig Dispense Refill  .  albuterol (2.5 MG/3ML) 0.083% NEBU 3 mL, albuterol (5 MG/ML) 0.5% NEBU 0.5 mL Inhale 3 mg into the lungs as needed.    [name] albuterol (PROVENTIL HFA;VENTOLIN HFA) 108 (90 Base) MCG/ACT inhaler Inhale 1-2 puffs into the lungs every 6 (six) hours as needed for wheezing or shortness of breath. 1 Inhaler 3  .  citalopram (CELEXA) 20 MG tablet Take 20 mg by mouth daily.    . clobetasol (TEMOVATE) 0.05 % external solution Apply 1 application topically 2 (two) times daily.    . Fluticasone-Salmeterol (WIXELA INHUB) 500-50 MCG/DOSE AEPB Inhale 1 puff into the lungs 2 (two) times daily.    [name] ketoconazole (NIZORAL) 2 % cream APPLY TO AREA ON LEFT ARM AND LEFT LEG DAILY FOR 14 DAYS 30 g 0  . levothyroxine (SYNTHROID, LEVOTHROID) 150 MCG tablet Take 1 tablet (150 mcg total) by mouth daily before breakfast. 30 tablet 3  . modafinil (PROVIGIL) 200 MG tablet Take 200 mg by mouth daily.    [name] omeprazole (PRILOSEC) 40 MG capsule Take 40 mg by mouth daily.    . pseudoephedrine (SUDAFED) 30 MG tablet Take 1 tablet (30 mg total) by mouth every 6 (six) hours as needed for congestion. 20 tablet 0  . umeclidinium bromide (INCRUSE ELLIPTA) 62.5 MCG/INH AEPB Inhale 1 puff into the lungs daily. 30 each 5  . zolpidem (AMBIEN) 10 MG tablet Take 10 mg by mouth at bedtime as needed for sleep.     No current facility-administered medications on file prior to visit.     PAST MEDICAL HISTORY: Past Medical History:  Diagnosis Date  . Alcohol abuse   . Anemia   . Asthma   . Blood in stool   . Colon polyps   . COPD (chronic obstructive pulmonary disease) (HCC)   . Depression   . Depression   . GERD (gastroesophageal reflux disease)   . Heart murmur   . History of alcohol abuse   . History of blood transfusion 2006  . HLD (hyperlipidemia)   . Obesity   . Sleep apnea   . Thyroid disease     PAST SURGICAL HISTORY: Past Surgical History:  Procedure Laterality Date  . AUGMENTATION MAMMAPLASTY    . BREAST BIOPSY    . LAPAROSCOPIC GASTRIC BANDING  2010    SOCIAL HISTORY: Social History   Tobacco Use  . Smoking status: Former Games developer  . Smokeless tobacco: Never Used  Substance Use Topics  . Alcohol use: No    Frequency: Never  . Drug use: No    FAMILY HISTORY: Family History  Problem Relation Age of Onset   . Alcohol abuse Mother   . Depression Mother   . Early death Mother   . Liver disease Mother   . Alcohol abuse Father   . Obesity Father   . Cancer Father     ROS: Review of Systems  Constitutional: Negative for weight loss.  Cardiovascular: Negative for chest pain.  Gastrointestinal: Negative for nausea and vomiting.  Musculoskeletal:       Negative for muscle weakness    PHYSICAL EXAM: Pt in no acute distress  RECENT LABS AND TESTS: BMET    Component Value Date/Time   NA 142 02/02/2019 1527   K 4.6 02/02/2019 1527   CL 101 02/02/2019 1527   CO2 21 02/02/2019 1527   GLUCOSE 101 (H) 02/02/2019 1527   GLUCOSE 108 (H) 05/23/2018 0853   BUN 14 02/02/2019 1527   CREATININE 0.65 02/02/2019 1527   CALCIUM  9.1 02/02/2019 1527   GFRNONAA 98 02/02/2019 1527   GFRAA 113 02/02/2019 1527   Lab Results  Component Value Date   HGBA1C 5.9 (H) 02/02/2019   HGBA1C 5.9 (H) 09/25/2018   HGBA1C 6.0 (H) 06/23/2018   Lab Results  Component Value Date   INSULIN 13.7 02/02/2019   INSULIN 13.5 09/25/2018   INSULIN 10.9 06/05/2018   CBC    Component Value Date/Time   WBC 6.2 06/05/2018 0937   RBC 5.23 06/05/2018 0937   HGB 15.2 06/05/2018 0937   HCT 45.3 06/05/2018 0937   MCV 87 06/05/2018 0937   MCH 29.1 06/05/2018 0937   MCHC 33.6 06/05/2018 0937   RDW 15.2 06/05/2018 0937   LYMPHSABS 2.1 06/05/2018 0937   EOSABS 0.1 06/05/2018 0937   BASOSABS 0.1 06/05/2018 0937   Iron/TIBC/Ferritin/ %Sat No results found for: IRON, TIBC, FERRITIN, IRONPCTSAT Lipid Panel     Component Value Date/Time   CHOL 245 (H) 02/02/2019 1527   TRIG 121 02/02/2019 1527   HDL 47 02/02/2019 1527   CHOLHDL 5 05/23/2018 0853   VLDL 16.6 05/23/2018 0853   LDLCALC 174 (H) 02/02/2019 1527   Hepatic Function Panel     Component Value Date/Time   PROT 6.7 02/02/2019 1527   ALBUMIN 4.2 02/02/2019 1527   AST 17 02/02/2019 1527   ALT 16 02/02/2019 1527   ALKPHOS 98 02/02/2019 1527   BILITOT 0.3  02/02/2019 1527      Component Value Date/Time   TSH 4.450 06/05/2018 0937   TSH 3.47 01/27/2018 1143    Results for KYLIAH, DEANDA ANN (MRN [id number]) as of 03/02/2019 11:51  Ref. Range 02/02/2019 15:27  Vitamin D, 25-Hydroxy Latest Ref Range: 30.0 - 100.0 ng/mL 46.2    I, [place] Crane, am acting as Energy manager for Alois Cliche, PA-C I, Alois Cliche, PA-C have reviewed above note and agree with its content

## 2019-03-04 ENCOUNTER — Other Ambulatory Visit: Payer: Self-pay | Admitting: Family Medicine

## 2019-03-04 DIAGNOSIS — J449 Chronic obstructive pulmonary disease, unspecified: Secondary | ICD-10-CM

## 2019-03-23 ENCOUNTER — Other Ambulatory Visit: Payer: Self-pay

## 2019-03-23 ENCOUNTER — Ambulatory Visit (INDEPENDENT_AMBULATORY_CARE_PROVIDER_SITE_OTHER): Payer: BC Managed Care – PPO | Admitting: Physician Assistant

## 2019-03-23 ENCOUNTER — Encounter (INDEPENDENT_AMBULATORY_CARE_PROVIDER_SITE_OTHER): Payer: Self-pay | Admitting: Physician Assistant

## 2019-03-23 DIAGNOSIS — E559 Vitamin D deficiency, unspecified: Secondary | ICD-10-CM

## 2019-03-23 DIAGNOSIS — Z6841 Body Mass Index (BMI) 40.0 and over, adult: Secondary | ICD-10-CM | POA: Diagnosis not present

## 2019-03-23 DIAGNOSIS — E038 Other specified hypothyroidism: Secondary | ICD-10-CM | POA: Diagnosis not present

## 2019-03-23 NOTE — Progress Notes (Signed)
Office: [phone]  /  Fax: [phone] TeleHealth Visit:  Pamela [name] has verbally consented to this TeleHealth visit today. The patient is located at home, the provider is located at the [facility] office. The participants in this visit include the listed provider and patient. The visit was conducted today via Webex.  HPI:   Chief Complaint: OBESITY Pamela [name] is here to discuss her progress with her obesity treatment plan. She is keeping a food journal with 1300-1400 calories and 75 grams of protein daily and is following her eating plan approximately 75% of the time. She states she is walking 120 minutes 7 times per week. Pamela [name] reports that she just ordered a scale. She states she was stressed for a few days and binged on cookies, otherwise, she continues to do well on the plan. We were unable to weigh the patient today for this TeleHealth visit. She feels as if she has maintained her weight since her last visit. She has lost 28 lbs since starting treatment with [name].  Vitamin D deficiency Pamela [name] has a diagnosis of Vitamin D deficiency. She is currently taking prescription Vit D and denies nausea, vomiting or muscle weakness.  Hypothyroidism Pamela [name] has a diagnosis of hypothyroidism. She is on levothyroxine. She denies hot or cold intolerance.  ASSESSMENT AND PLAN:  Vitamin D deficiency - Plan: Vitamin D, Ergocalciferol, ([name]) 1.25 MG (50000 UT) CAPS capsule  Other specified hypothyroidism  Class 3 severe obesity with serious comorbidity and body mass index (BMI) of 45.0 to 49.9 in adult, unspecified obesity type (HCC)  PLAN:  Vitamin D Deficiency Pamela [name] was informed that low Vitamin D levels contributes to fatigue and are associated with obesity, breast, and colon cancer. She agrees to continue to take prescription Vit D @ 50,000 IU every week #4 with 0 refills (won't need for a week) and will follow-up for routine testing of Vitamin D, at least 2-3  times per year. She was informed of the risk of over-replacement of Vitamin D and agrees to not increase her dose unless she discusses this with [name] first. Pamela [name] agrees to follow-up with our clinic in 2 weeks.  Hypothyroidism Pamela [name] was informed of the importance of good thyroid control to help with weight loss efforts. She was also informed that supertheraputic thyroid levels are dangerous and will not improve weight loss results. Pamela [name] will continue with levothyroxine and agrees to follow-up with our clinic as directed.  Obesity Pamela [name] is currently in the action stage of change. As such, her goal is to continue with weight loss efforts. She has agreed to keep a food journal with 1300-1400 calories and 75 grams of protein daily. Pamela [name] has been instructed to work up to a goal of 150 minutes of combined cardio and strengthening exercise per week for weight loss and overall health benefits. We discussed the following Behavioral Modification Strategies today: work on meal planning, easy cooking plans, and keeping healthy foods in the home.  Pamela [name] has agreed to follow-up with our clinic in 2 weeks. She was informed of the importance of frequent follow up visits to maximize her success with intensive lifestyle modifications for her multiple health conditions.  ALLERGIES: No Known Allergies  MEDICATIONS: Current Outpatient Medications on File Prior to Visit  Medication Sig Dispense Refill  . albuterol (2.5 MG/3ML) 0.083% NEBU 3 mL, albuterol (5 MG/ML) 0.5% NEBU 0.5 mL Inhale 3 mg into the lungs as needed.    [name] albuterol (PROVENTIL HFA;VENTOLIN HFA) 108 (90 Base) MCG/ACT inhaler  Inhale 1-2 puffs into the lungs every 6 (six) hours as needed for wheezing or shortness of breath. 1 Inhaler 3  . citalopram (CELEXA) 20 MG tablet Take 20 mg by mouth daily.    . clobetasol (TEMOVATE) 0.05 % external solution Apply 1 application topically 2 (two) times daily.    . Fluticasone-Salmeterol (WIXELA  INHUB) 500-50 MCG/DOSE AEPB Inhale 1 puff into the lungs 2 (two) times daily.    . INCRUSE ELLIPTA 62.5 MCG/INH AEPB INHALE 1 PUFF INTO THE LUNGS DAILY 30 each 5  . ketoconazole (NIZORAL) 2 % cream APPLY TO AREA ON LEFT ARM AND LEFT LEG DAILY FOR 14 DAYS 30 g 0  . levothyroxine (SYNTHROID, LEVOTHROID) 150 MCG tablet Take 1 tablet (150 mcg total) by mouth daily before breakfast. 30 tablet 3  . modafinil (PROVIGIL) 200 MG tablet Take 200 mg by mouth daily.    [name] omeprazole (PRILOSEC) 40 MG capsule Take 40 mg by mouth daily.    . pseudoephedrine (SUDAFED) 30 MG tablet Take 1 tablet (30 mg total) by mouth every 6 (six) hours as needed for congestion. 20 tablet 0  . Vitamin D, Ergocalciferol, ([name]) 1.25 MG (50000 UT) CAPS capsule Take 1 capsule (50,000 Units total) by mouth every 7 (seven) days. 4 capsule 0  . zolpidem (AMBIEN) 10 MG tablet Take 10 mg by mouth at bedtime as needed for sleep.     No current facility-administered medications on file prior to visit.     PAST MEDICAL HISTORY: Past Medical History:  Diagnosis Date  . Alcohol abuse   . Anemia   . Asthma   . Blood in stool   . Colon polyps   . COPD (chronic obstructive pulmonary disease) (HCC)   . Depression   . Depression   . GERD (gastroesophageal reflux disease)   . Heart murmur   . History of alcohol abuse   . History of blood transfusion 2006  . HLD (hyperlipidemia)   . Obesity   . Sleep apnea   . Thyroid disease     PAST SURGICAL HISTORY: Past Surgical History:  Procedure Laterality Date  . AUGMENTATION MAMMAPLASTY    . BREAST BIOPSY    . LAPAROSCOPIC GASTRIC BANDING  2010    SOCIAL HISTORY: Social History   Tobacco Use  . Smoking status: Former Games developer  . Smokeless tobacco: Never Used  Substance Use Topics  . Alcohol use: No    Frequency: Never  . Drug use: No    FAMILY HISTORY: Family History  Problem Relation Age of Onset  . Alcohol abuse Mother   . Depression Mother   . Early death Mother    . Liver disease Mother   . Alcohol abuse Father   . Obesity Father   . Cancer Father    ROS: Review of Systems  Gastrointestinal: Negative for nausea and vomiting.  Musculoskeletal:       Negative for muscle weakness.  Endo/Heme/Allergies:       Negative for heat/cold intolerance.   PHYSICAL EXAM: Pt in no acute distress  RECENT LABS AND TESTS: BMET    Component Value Date/Time   NA 142 02/02/2019 1527   K 4.6 02/02/2019 1527   CL 101 02/02/2019 1527   CO2 21 02/02/2019 1527   GLUCOSE 101 (H) 02/02/2019 1527   GLUCOSE 108 (H) 05/23/2018 0853   BUN 14 02/02/2019 1527   CREATININE 0.65 02/02/2019 1527   CALCIUM 9.1 02/02/2019 1527   GFRNONAA 98 02/02/2019 1527   GFRAA  113 02/02/2019 1527   Lab Results  Component Value Date   HGBA1C 5.9 (H) 02/02/2019   HGBA1C 5.9 (H) 09/25/2018   HGBA1C 6.0 (H) 06/23/2018   Lab Results  Component Value Date   INSULIN 13.7 02/02/2019   INSULIN 13.5 09/25/2018   INSULIN 10.9 06/05/2018   CBC    Component Value Date/Time   WBC 6.2 06/05/2018 0937   RBC 5.23 06/05/2018 0937   HGB 15.2 06/05/2018 0937   HCT 45.3 06/05/2018 0937   MCV 87 06/05/2018 0937   MCH 29.1 06/05/2018 0937   MCHC 33.6 06/05/2018 0937   RDW 15.2 06/05/2018 0937   LYMPHSABS 2.1 06/05/2018 0937   EOSABS 0.1 06/05/2018 0937   BASOSABS 0.1 06/05/2018 0937   Iron/TIBC/Ferritin/ %Sat No results found for: IRON, TIBC, FERRITIN, IRONPCTSAT Lipid Panel     Component Value Date/Time   CHOL 245 (H) 02/02/2019 1527   TRIG 121 02/02/2019 1527   HDL 47 02/02/2019 1527   CHOLHDL 5 05/23/2018 0853   VLDL 16.6 05/23/2018 0853   LDLCALC 174 (H) 02/02/2019 1527   Hepatic Function Panel     Component Value Date/Time   PROT 6.7 02/02/2019 1527   ALBUMIN 4.2 02/02/2019 1527   AST 17 02/02/2019 1527   ALT 16 02/02/2019 1527   ALKPHOS 98 02/02/2019 1527   BILITOT 0.3 02/02/2019 1527      Component Value Date/Time   TSH 4.450 06/05/2018 0937   TSH 3.47  01/27/2018 1143   Results for Charlean SanfilippoAYLOR, Juliauna ANN (MRN [id number]) as of 03/23/2019 14:54  Ref. Range 02/02/2019 15:27  Vitamin D, 25-Hydroxy Latest Ref Range: 30.0 - 100.0 ng/mL 46.2   I, Marianna Paymentenise Haag, am acting as Energy managertranscriptionist for [employer] Ehan Freas, PA-C I, Alois Clicheracey Savahanna Almendariz, PA-C have reviewed above note and agree with its content

## 2019-03-24 MED ORDER — VITAMIN D (ERGOCALCIFEROL) 1.25 MG (50000 UNIT) PO CAPS: 50000.0000 [IU] | ORAL_CAPSULE | ORAL | 0 refills | Status: DC

## 2019-04-06 ENCOUNTER — Other Ambulatory Visit: Payer: Self-pay | Admitting: Family Medicine

## 2019-04-06 ENCOUNTER — Encounter: Payer: Self-pay | Admitting: Family Medicine

## 2019-04-06 ENCOUNTER — Other Ambulatory Visit: Payer: Self-pay

## 2019-04-06 ENCOUNTER — Ambulatory Visit (INDEPENDENT_AMBULATORY_CARE_PROVIDER_SITE_OTHER): Payer: BC Managed Care – PPO | Admitting: Family Medicine

## 2019-04-06 DIAGNOSIS — R1900 Intra-abdominal and pelvic swelling, mass and lump, unspecified site: Secondary | ICD-10-CM

## 2019-04-06 NOTE — Progress Notes (Signed)
Chief Complaint  Patient presents with  . Protrusion in lap band    Subjective: Patient is a 58 y.o. female here for bulge in abd.  Due to COVID-19 pandemic, we are interacting via web portal for an electronic face-to-face visit. I verified patient's ID using 2 identifiers. Patient agreed to proceed with visit via this method. Patient is at home, I am at office. Patient and I are present for visit.   Noticed 2 mo ago, several days ago started to get painful. Pain feels like a pulled muscle. No inj or change in activity. Denies fever, redness, bruising, N/V, bowel changes. Has been having more gas. No dietary changes.   ROS: Const: no fevers GI: As noted in HPI  Past Medical History:  Diagnosis Date  . Alcohol abuse   . Anemia   . Asthma   . Blood in stool   . Colon polyps   . COPD (chronic obstructive pulmonary disease) (HCC)   . Depression   . Depression   . GERD (gastroesophageal reflux disease)   . Heart murmur   . History of alcohol abuse   . History of blood transfusion 2006  . HLD (hyperlipidemia)   . Obesity   . Sleep apnea   . Thyroid disease     Objective: No conversational dyspnea Age appropriate judgment and insight Nml affect and mood Abd shows no ecchymosis or erythema  Assessment and Plan: Abdominal wall bulge - Plan: US Abdomen Limited  Ck [name]. No red flags. If unrevealing, will consider CT abd.  F/u prn.  The patient voiced understanding and agreement to the plan.  [name] [place], DO 04/06/19  12:56 PM

## 2019-04-13 ENCOUNTER — Ambulatory Visit (HOSPITAL_BASED_OUTPATIENT_CLINIC_OR_DEPARTMENT_OTHER)
Admission: RE | Admit: 2019-04-13 | Discharge: 2019-04-13 | Disposition: A | Discharge status: Home / Self Care | Payer: BC Managed Care – PPO | Source: Ambulatory Visit | Attending: Family Medicine | Admitting: Family Medicine

## 2019-04-13 ENCOUNTER — Encounter: Payer: Self-pay | Admitting: Family Medicine

## 2019-04-13 ENCOUNTER — Other Ambulatory Visit: Payer: Self-pay | Admitting: Family Medicine

## 2019-04-13 ENCOUNTER — Other Ambulatory Visit: Payer: Self-pay

## 2019-04-13 DIAGNOSIS — R1900 Intra-abdominal and pelvic swelling, mass and lump, unspecified site: Secondary | ICD-10-CM | POA: Diagnosis present

## 2019-04-13 DIAGNOSIS — K439 Ventral hernia without obstruction or gangrene: Secondary | ICD-10-CM

## 2019-04-16 ENCOUNTER — Encounter (INDEPENDENT_AMBULATORY_CARE_PROVIDER_SITE_OTHER): Payer: Self-pay | Admitting: Physician Assistant

## 2019-04-16 ENCOUNTER — Other Ambulatory Visit: Payer: Self-pay

## 2019-04-16 ENCOUNTER — Ambulatory Visit (INDEPENDENT_AMBULATORY_CARE_PROVIDER_SITE_OTHER): Payer: BC Managed Care – PPO | Admitting: Physician Assistant

## 2019-04-16 DIAGNOSIS — E7849 Other hyperlipidemia: Secondary | ICD-10-CM | POA: Diagnosis not present

## 2019-04-16 DIAGNOSIS — Z6841 Body Mass Index (BMI) 40.0 and over, adult: Secondary | ICD-10-CM

## 2019-04-16 DIAGNOSIS — E559 Vitamin D deficiency, unspecified: Secondary | ICD-10-CM | POA: Diagnosis not present

## 2019-04-16 MED ORDER — VITAMIN D (ERGOCALCIFEROL) 1.25 MG (50000 UNIT) PO CAPS: 50000.0000 [IU] | ORAL_CAPSULE | ORAL | 0 refills | Status: DC

## 2019-04-16 NOTE — Progress Notes (Signed)
Office: [phone]  /  Fax: [phone] TeleHealth Visit:  Pamela [name] has verbally consented to this TeleHealth visit today. The patient is located at home, the provider is located at the [facility] office. The participants in this visit include the listed provider and patient. The visit was conducted today via FaceTime.  HPI:   Chief Complaint: OBESITY Pamela [name] is here to discuss her progress with her obesity treatment plan. She is keeping a food journal with 1300-1400 calories and 75 grams of protein daily and is following her eating plan approximately 95% of the time. She states she is walking 2 hours 7 times per week. Pamela [name] reports her weight today is 266 lbs. She is averaging 1200-1300 calories daily. She has a hard time eating more because of her lap band and reports not reaching her protein goal every day. We were unable to weigh the patient today for this TeleHealth visit. She feels as if she has lost 4 lbs (weight today 266 lbs) since her last visit. She has lost 28 lbs since starting treatment with us.  Hyperlipidemia Pamela [name] has hyperlipidemia and has been trying to improve her cholesterol levels with intensive lifestyle modification including a low saturated fat diet, exercise and weight loss. She is on no medication and denies any chest pain.  Vitamin D deficiency Pamela [name] has a diagnosis of Vitamin D deficiency. She is currently taking prescription Vit D and denies nausea, vomiting or muscle weakness.  ASSESSMENT AND PLAN:  Vitamin D deficiency - Plan: Vitamin D, Ergocalciferol, ([name]) 1.25 MG (50000 UT) CAPS capsule  Other hyperlipidemia  Class 3 severe obesity with serious comorbidity and body mass index (BMI) of 45.0 to 49.9 in adult, unspecified obesity type (HCC)  PLAN:  Hyperlipidemia Pamela [name] was informed of the American Heart Association Guidelines emphasizing intensive lifestyle modifications as the first line treatment for  hyperlipidemia. We discussed many lifestyle modifications today in depth, and Pamela [name] will continue to work on decreasing saturated fats such as fatty red meat, butter and many fried foods. She will also increase vegetables and lean protein in her diet and continue to work on exercise and weight loss efforts.  Vitamin D Deficiency Pamela [name] was informed that low Vitamin D levels contributes to fatigue and are associated with obesity, breast, and colon cancer. She agrees to continue to take prescription Vit D @ 50,000 IU every week #4 with 0 refills and will follow-up for routine testing of Vitamin D, at least 2-3 times per year. She was informed of the risk of over-replacement of Vitamin D and agrees to not increase her dose unless she discusses this with us first. Pamela [name] agrees to follow-up with our clinic in 2 weeks.  Obesity Pamela [name] is currently in the action stage of change. As such, her goal is to continue with weight loss efforts. She has agreed to keep a food journal with 1300-1400 calories and 75 grams of protein.  Pamela [name] has been instructed to work up to a goal of 150 minutes of combined cardio and strengthening exercise per week for weight loss and overall health benefits. We discussed the following Behavioral Modification Strategies today: increasing lean protein intake, work on meal planning and easy cooking plans.  Pamela [name] has agreed to follow-up with our clinic in 2 weeks. She was informed of the importance of frequent follow-up visits to maximize her success with intensive lifestyle modifications for her multiple health conditions.  ALLERGIES: No Known Allergies  MEDICATIONS: Current Outpatient Medications on File Prior to Visit  Medication Sig Dispense Refill  . albuterol (2.5 MG/3ML) 0.083% NEBU 3 mL, albuterol (5 MG/ML) 0.5% NEBU 0.5 mL Inhale 3 mg into the lungs as needed.    [name] albuterol (PROVENTIL HFA;VENTOLIN HFA) 108 (90 Base) MCG/ACT inhaler Inhale 1-2 puffs into the lungs  every 6 (six) hours as needed for wheezing or shortness of breath. 1 Inhaler 3  . citalopram (CELEXA) 20 MG tablet Take 20 mg by mouth daily.    . clobetasol (TEMOVATE) 0.05 % external solution Apply 1 application topically 2 (two) times daily.    . INCRUSE ELLIPTA 62.5 MCG/INH AEPB INHALE 1 PUFF INTO THE LUNGS DAILY 30 each 5  . ketoconazole (NIZORAL) 2 % cream APPLY TO AREA ON LEFT ARM AND LEFT LEG DAILY FOR 14 DAYS 30 g 0  . levothyroxine (SYNTHROID, LEVOTHROID) 150 MCG tablet Take 1 tablet (150 mcg total) by mouth daily before breakfast. 30 tablet 3  . modafinil (PROVIGIL) 200 MG tablet Take 200 mg by mouth daily.    [name] omeprazole (PRILOSEC) 40 MG capsule Take 40 mg by mouth daily.    . pseudoephedrine (SUDAFED) 30 MG tablet Take 1 tablet (30 mg total) by mouth every 6 (six) hours as needed for congestion. 20 tablet 0  . WIXELA INHUB 500-50 MCG/DOSE AEPB INHALE 1 PUFF INTO THE LUNGS TWICE DAILY 60 each 0  . zolpidem (AMBIEN) 10 MG tablet Take 10 mg by mouth at bedtime as needed for sleep.     No current facility-administered medications on file prior to visit.     PAST MEDICAL HISTORY: Past Medical History:  Diagnosis Date  . Alcohol abuse   . Anemia   . Asthma   . Blood in stool   . Colon polyps   . COPD (chronic obstructive pulmonary disease) (HCC)   . Depression   . Depression   . GERD (gastroesophageal reflux disease)   . Heart murmur   . History of alcohol abuse   . History of blood transfusion 2006  . HLD (hyperlipidemia)   . Obesity   . Sleep apnea   . Thyroid disease     PAST SURGICAL HISTORY: Past Surgical History:  Procedure Laterality Date  . AUGMENTATION MAMMAPLASTY    . BREAST BIOPSY    . LAPAROSCOPIC GASTRIC BANDING  2010    SOCIAL HISTORY: Social History   Tobacco Use  . Smoking status: Former Games developer  . Smokeless tobacco: Never Used  Substance Use Topics  . Alcohol use: No    Frequency: Never  . Drug use: No    FAMILY HISTORY: Family History   Problem Relation Age of Onset  . Alcohol abuse Mother   . Depression Mother   . Early death Mother   . Liver disease Mother   . Alcohol abuse Father   . Obesity Father   . Cancer Father    ROS: Review of Systems  Cardiovascular: Negative for chest pain.  Gastrointestinal: Negative for nausea and vomiting.  Musculoskeletal:       Negative for muscle weakness.   PHYSICAL EXAM: Pt in no acute distress  RECENT LABS AND TESTS: BMET    Component Value Date/Time   NA 142 02/02/2019 1527   K 4.6 02/02/2019 1527   CL 101 02/02/2019 1527   CO2 21 02/02/2019 1527   GLUCOSE 101 (H) 02/02/2019 1527   GLUCOSE 108 (H) 05/23/2018 0853   BUN 14 02/02/2019 1527   CREATININE 0.65 02/02/2019 1527   CALCIUM 9.1 02/02/2019 1527   GFRNONAA  98 02/02/2019 1527   GFRAA 113 02/02/2019 1527   Lab Results  Component Value Date   HGBA1C 5.9 (H) 02/02/2019   HGBA1C 5.9 (H) 09/25/2018   HGBA1C 6.0 (H) 06/23/2018   Lab Results  Component Value Date   INSULIN 13.7 02/02/2019   INSULIN 13.5 09/25/2018   INSULIN 10.9 06/05/2018   CBC    Component Value Date/Time   WBC 6.2 06/05/2018 0937   RBC 5.23 06/05/2018 0937   HGB 15.2 06/05/2018 0937   HCT 45.3 06/05/2018 0937   MCV 87 06/05/2018 0937   MCH 29.1 06/05/2018 0937   MCHC 33.6 06/05/2018 0937   RDW 15.2 06/05/2018 0937   LYMPHSABS 2.1 06/05/2018 0937   EOSABS 0.1 06/05/2018 0937   BASOSABS 0.1 06/05/2018 0937   Iron/TIBC/Ferritin/ %Sat No results found for: IRON, TIBC, FERRITIN, IRONPCTSAT Lipid Panel     Component Value Date/Time   CHOL 245 (H) 02/02/2019 1527   TRIG 121 02/02/2019 1527   HDL 47 02/02/2019 1527   CHOLHDL 5 05/23/2018 0853   VLDL 16.6 05/23/2018 0853   LDLCALC 174 (H) 02/02/2019 1527   Hepatic Function Panel     Component Value Date/Time   PROT 6.7 02/02/2019 1527   ALBUMIN 4.2 02/02/2019 1527   AST 17 02/02/2019 1527   ALT 16 02/02/2019 1527   ALKPHOS 98 02/02/2019 1527   BILITOT 0.3 02/02/2019 1527       Component Value Date/Time   TSH 4.450 06/05/2018 0937   TSH 3.47 01/27/2018 1143   Results for ASHNI, LONZO ANN (MRN [id number]) as of 04/16/2019 13:50  Ref. Range 02/02/2019 15:27  Vitamin D, 25-Hydroxy Latest Ref Range: 30.0 - 100.0 ng/mL 46.2    I, Marianna Payment, am acting as Energy manager for [employer], PA-C I, Alois Cliche, PA-C have reviewed above note and agree with its content

## 2019-05-05 ENCOUNTER — Ambulatory Visit (INDEPENDENT_AMBULATORY_CARE_PROVIDER_SITE_OTHER): Payer: BC Managed Care – PPO | Admitting: Physician Assistant

## 2019-05-05 ENCOUNTER — Encounter (INDEPENDENT_AMBULATORY_CARE_PROVIDER_SITE_OTHER): Payer: Self-pay | Admitting: Physician Assistant

## 2019-05-05 ENCOUNTER — Other Ambulatory Visit: Payer: Self-pay

## 2019-05-05 DIAGNOSIS — Z6841 Body Mass Index (BMI) 40.0 and over, adult: Secondary | ICD-10-CM

## 2019-05-05 DIAGNOSIS — E559 Vitamin D deficiency, unspecified: Secondary | ICD-10-CM | POA: Diagnosis not present

## 2019-05-06 NOTE — Progress Notes (Signed)
Office: [phone]  /  Fax: [phone] TeleHealth Visit:  Pamela [name] has verbally consented to this TeleHealth visit today. The patient is located at home, the provider is located at the [facility] office. The participants in this visit include the listed provider and patient. The visit was conducted today via Face Time.  HPI:   Chief Complaint: OBESITY Pamela [name] is here to discuss her progress with her obesity treatment plan. She is on the  keep a food journal with 1300 to 1400 calories and 75 grams of protein and is following her eating plan approximately 95 to 100 % of the time. She states she is exercising 0 minutes 0 times per week. Pamela [name]'s most recent weight is 265 pounds. She was sick the past few days and only able to eat soup. She is feeling better and is back on the plan.  We were unable to weigh the patient today for this TeleHealth visit. She feels as if she has lost weight since her last visit. She has lost 28 lbs since starting treatment with Pamela [name].  Vitamin D Deficiency Pamela [name] has a diagnosis of vitamin D deficiency. She is currently on prescription vit D. Pamela [name] denies nausea, vomiting, or muscle weakness.  ASSESSMENT AND PLAN:  Vitamin D deficiency  Class 3 severe obesity with serious comorbidity and body mass index (BMI) of 45.0 to 49.9 in adult, unspecified obesity type ([place])  PLAN:  Vitamin D Deficiency Pamela [name] was informed that low vitamin D levels contribute to fatigue and are associated with obesity, breast, and colon cancer. Marcela agrees to continue to take prescription Vit D @50 ,000 IU every week and will follow up for routine testing of vitamin D, at least 2-3 times per year. She was informed of the risk of over-replacement of vitamin D and agrees to not increase her dose unless she discusses this with Pamela [name] first. Pessy agrees to follow up as directed.  Time spent on visit was 25 minutes.  Obesity Pamela [name] is currently in the  action stage of change. As such, her goal is to continue with weight loss efforts. She has agreed to keep a food journal with 1300 to 1400 calories and 75 grams of protein.  Pamela [name] has been instructed to work up to a goal of 150 minutes of combined cardio and strengthening exercise per week for weight loss and overall health benefits. We discussed the following Behavioral Modification Strategies today: work on meal planning and easy cooking plans and keeping healthy foods in the home.  Pamela [name] has agreed to follow up with our clinic. She was informed of the importance of frequent follow up visits to maximize her success with intensive lifestyle modifications for her multiple health conditions.  ALLERGIES: No Known Allergies  MEDICATIONS: Current Outpatient Medications on File Prior to Visit  Medication Sig Dispense Refill  . albuterol (2.5 MG/3ML) 0.083% NEBU 3 mL, albuterol (5 MG/ML) 0.5% NEBU 0.5 mL Inhale 3 mg into the lungs as needed.    [name] albuterol (PROVENTIL HFA;VENTOLIN HFA) 108 (90 Base) MCG/ACT inhaler Inhale 1-2 puffs into the lungs every 6 (six) hours as needed for wheezing or shortness of breath. 1 Inhaler 3  . citalopram (CELEXA) 20 MG tablet Take 20 mg by mouth daily.    . clobetasol (TEMOVATE) 0.05 % external solution Apply 1 application topically 2 (two) times daily.    . INCRUSE ELLIPTA 62.5 MCG/INH AEPB INHALE 1 PUFF INTO THE LUNGS DAILY 30 each 5  . ketoconazole (NIZORAL) 2 % cream  APPLY TO AREA ON LEFT ARM AND LEFT LEG DAILY FOR 14 DAYS 30 g 0  . levothyroxine (SYNTHROID, LEVOTHROID) 150 MCG tablet Take 1 tablet (150 mcg total) by mouth daily before breakfast. 30 tablet 3  . modafinil (PROVIGIL) 200 MG tablet Take 200 mg by mouth daily.    [name]. omeprazole (PRILOSEC) 40 MG capsule Take 40 mg by mouth daily.    . pseudoephedrine (SUDAFED) 30 MG tablet Take 1 tablet (30 mg total) by mouth every 6 (six) hours as needed for congestion. 20 tablet 0  . Vitamin D, Ergocalciferol,  ([name]) 1.25 MG (50000 UT) CAPS capsule Take 1 capsule (50,000 Units total) by mouth every 7 (seven) days. 4 capsule 0  . WIXELA INHUB 500-50 MCG/DOSE AEPB INHALE 1 PUFF INTO THE LUNGS TWICE DAILY 60 each 0  . zolpidem (AMBIEN) 10 MG tablet Take 10 mg by mouth at bedtime as needed for sleep.     No current facility-administered medications on file prior to visit.     PAST MEDICAL HISTORY: Past Medical History:  Diagnosis Date  . Alcohol abuse   . Anemia   . Asthma   . Blood in stool   . Colon polyps   . COPD (chronic obstructive pulmonary disease) (HCC)   . Depression   . Depression   . GERD (gastroesophageal reflux disease)   . Heart murmur   . History of alcohol abuse   . History of blood transfusion 2006  . HLD (hyperlipidemia)   . Obesity   . Sleep apnea   . Thyroid disease     PAST SURGICAL HISTORY: Past Surgical History:  Procedure Laterality Date  . AUGMENTATION MAMMAPLASTY    . BREAST BIOPSY    . LAPAROSCOPIC GASTRIC BANDING  2010    SOCIAL HISTORY: Social History   Tobacco Use  . Smoking status: Former Games developermoker  . Smokeless tobacco: Never Used  Substance Use Topics  . Alcohol use: No    Frequency: Never  . Drug use: No    FAMILY HISTORY: Family History  Problem Relation Age of Onset  . Alcohol abuse Mother   . Depression Mother   . Early death Mother   . Liver disease Mother   . Alcohol abuse Father   . Obesity Father   . Cancer Father     ROS: Review of Systems  Gastrointestinal: Negative for nausea and vomiting.  Musculoskeletal:       Negative for muscle weakness.    PHYSICAL EXAM: Pt in no acute distress  RECENT LABS AND TESTS: BMET    Component Value Date/Time   NA 142 02/02/2019 1527   K 4.6 02/02/2019 1527   CL 101 02/02/2019 1527   CO2 21 02/02/2019 1527   GLUCOSE 101 (H) 02/02/2019 1527   GLUCOSE 108 (H) 05/23/2018 0853   BUN 14 02/02/2019 1527   CREATININE 0.65 02/02/2019 1527   CALCIUM 9.1 02/02/2019 1527    GFRNONAA 98 02/02/2019 1527   GFRAA 113 02/02/2019 1527   Lab Results  Component Value Date   HGBA1C 5.9 (H) 02/02/2019   HGBA1C 5.9 (H) 09/25/2018   HGBA1C 6.0 (H) 06/23/2018   Lab Results  Component Value Date   INSULIN 13.7 02/02/2019   INSULIN 13.5 09/25/2018   INSULIN 10.9 06/05/2018   CBC    Component Value Date/Time   WBC 6.2 06/05/2018 0937   RBC 5.23 06/05/2018 0937   HGB 15.2 06/05/2018 0937   HCT 45.3 06/05/2018 0937   MCV 87 06/05/2018  0937   MCH 29.1 06/05/2018 0937   MCHC 33.6 06/05/2018 0937   RDW 15.2 06/05/2018 0937   LYMPHSABS 2.1 06/05/2018 0937   EOSABS 0.1 06/05/2018 0937   BASOSABS 0.1 06/05/2018 0937   Iron/TIBC/Ferritin/ %Sat No results found for: IRON, TIBC, FERRITIN, IRONPCTSAT Lipid Panel     Component Value Date/Time   CHOL 245 (H) 02/02/2019 1527   TRIG 121 02/02/2019 1527   HDL 47 02/02/2019 1527   CHOLHDL 5 05/23/2018 0853   VLDL 16.6 05/23/2018 0853   LDLCALC 174 (H) 02/02/2019 1527   Hepatic Function Panel     Component Value Date/Time   PROT 6.7 02/02/2019 1527   ALBUMIN 4.2 02/02/2019 1527   AST 17 02/02/2019 1527   ALT 16 02/02/2019 1527   ALKPHOS 98 02/02/2019 1527   BILITOT 0.3 02/02/2019 1527      Component Value Date/Time   TSH 4.450 06/05/2018 0937   TSH 3.47 01/27/2018 1143    Results for Charlean SanfilippoAYLOR, Tracey ANN (MRN [id number]) as of 05/06/2019 16:46  Ref. Range 02/02/2019 15:27  Vitamin D, 25-Hydroxy Latest Ref Range: 30.0 - 100.0 ng/mL 46.2    I, Kirke Corinara Farra Nikolic, CMA, am acting as transcriptionist for Alois Clicheracey Aguilar, PA-C I, Alois Clicheracey Aguilar, PA-C have reviewed above note and agree with its content

## 2019-05-08 ENCOUNTER — Other Ambulatory Visit: Payer: Self-pay | Admitting: Surgery

## 2019-05-25 ENCOUNTER — Encounter (INDEPENDENT_AMBULATORY_CARE_PROVIDER_SITE_OTHER): Payer: Self-pay | Admitting: Physician Assistant

## 2019-05-25 ENCOUNTER — Other Ambulatory Visit: Payer: Self-pay

## 2019-05-25 ENCOUNTER — Telehealth (INDEPENDENT_AMBULATORY_CARE_PROVIDER_SITE_OTHER): Payer: BC Managed Care – PPO | Admitting: Physician Assistant

## 2019-05-25 DIAGNOSIS — E559 Vitamin D deficiency, unspecified: Secondary | ICD-10-CM

## 2019-05-25 DIAGNOSIS — Z6841 Body Mass Index (BMI) 40.0 and over, adult: Secondary | ICD-10-CM

## 2019-05-25 MED ORDER — VITAMIN D (ERGOCALCIFEROL) 1.25 MG (50000 UNIT) PO CAPS: 50000.0000 [IU] | ORAL_CAPSULE | ORAL | 0 refills | Status: DC

## 2019-05-25 NOTE — Progress Notes (Signed)
Office: [phone]  /  Fax: [phone] TeleHealth Visit:  Pamela OharaDorothy Ann [name] has verbally consented to this TeleHealth visit today. The patient is located at home, the provider is located at the [facility]. The participants in this visit include the listed provider and patient. The visit was conducted today via FaceTime.  HPI:   Chief Complaint: OBESITY Pamela [name] is here to discuss her progress with her obesity treatment plan. She is on the Category 1 plan and is following her eating plan approximately 100% of the time. She states she is walking 40 minutes 7 times per week. Pamela [name] reports that she has done very well with the plan and is frustrated with the lack of weight loss. Her most recent weight was 265 lbs. She is having a hernia repair on 07/20. She reports snacking on a party mix, which is causing sweet cravings. We were unable to weigh the patient today for this TeleHealth visit. She feels as if she has gained 1 lb since her last visit. She has lost 28 lbs since starting treatment with us.  Vitamin D deficiency Pamela [name] has a diagnosis of Vitamin D deficiency. She is currently taking prescription Vit D once weekly and denies nausea, vomiting or muscle weakness.  Hyperlipidemia Pamela [name] has hyperlipidemia and has been trying to improve her cholesterol levels with intensive lifestyle modification including a low saturated fat diet, exercise and weight loss. She is on no medication and denies any chest pain.   ASSESSMENT AND PLAN:  Class 3 severe obesity with serious comorbidity and body mass index (BMI) of 45.0 to 49.9 in adult, unspecified obesity type (HCC)  Vitamin D deficiency - Plan: Vitamin D, Ergocalciferol, ([name]) 1.25 MG (50000 UT) CAPS capsule, DISCONTINUED: Vitamin D, Ergocalciferol, ([name]) 1.25 MG (50000 UT) CAPS capsule  PLAN:  Vitamin D Deficiency Pamela [name] was informed that low Vitamin D levels contributes to fatigue and are associated  with obesity, breast, and colon cancer. She agrees to continue to take prescription Vit D @ 50,000 IU every week #4 with 0 refills and will follow-up for routine testing of Vitamin D, at least 2-3 times per year. She was informed of the risk of over-replacement of Vitamin D and agrees to not increase her dose unless she discusses this with us first. Pamela [name] agrees to follow-up with our clinic in 2 weeks.  Hyperlipidemia Pamela [name] was informed of the American Heart Association Guidelines emphasizing intensive lifestyle modifications as the first line treatment for hyperlipidemia. We discussed many lifestyle modifications today in depth, and Pamela [name] will continue to work on decreasing saturated fats such as fatty red meat, butter and many fried foods. She will also increase vegetables and lean protein in her diet and continue to work on exercise and weight loss efforts.  Obesity Pamela [name] is currently in the action stage of change. As such, her goal is to continue with weight loss efforts. She has agreed to follow the Category 1 plan. Pamela [name] has been instructed to work up to a goal of 150 minutes of combined cardio and strengthening exercise per week for weight loss and overall health benefits. We discussed the following Behavioral Modification Strategies today: work on meal planning, easy cooking plans, and better snacking choices.  Pamela [name] has agreed to follow-up with our clinic in 2 weeks. She was informed of the importance of frequent follow-up visits to maximize her success with intensive lifestyle modifications for her multiple health conditions.  ALLERGIES: No Known Allergies  MEDICATIONS: Current Outpatient Medications on File Prior  to Visit  Medication Sig Dispense Refill   albuterol (2.5 MG/3ML) 0.083% NEBU 3 mL, albuterol (5 MG/ML) 0.5% NEBU 0.5 mL Inhale 3 mg into the lungs as needed.     albuterol (PROVENTIL HFA;VENTOLIN HFA) 108 (90 Base) MCG/ACT inhaler Inhale 1-2 puffs into the  lungs every 6 (six) hours as needed for wheezing or shortness of breath. 1 Inhaler 3   citalopram (CELEXA) 20 MG tablet Take 20 mg by mouth daily.     clobetasol (TEMOVATE) 0.05 % external solution Apply 1 application topically 2 (two) times daily.     INCRUSE ELLIPTA 62.5 MCG/INH AEPB INHALE 1 PUFF INTO THE LUNGS DAILY 30 each 5   ketoconazole (NIZORAL) 2 % cream APPLY TO AREA ON LEFT ARM AND LEFT LEG DAILY FOR 14 DAYS 30 g 0   levothyroxine (SYNTHROID, LEVOTHROID) 150 MCG tablet Take 1 tablet (150 mcg total) by mouth daily before breakfast. 30 tablet 3   modafinil (PROVIGIL) 200 MG tablet Take 200 mg by mouth daily.     omeprazole (PRILOSEC) 40 MG capsule Take 40 mg by mouth daily.     pseudoephedrine (SUDAFED) 30 MG tablet Take 1 tablet (30 mg total) by mouth every 6 (six) hours as needed for congestion. 20 tablet 0   WIXELA INHUB 500-50 MCG/DOSE AEPB INHALE 1 PUFF INTO THE LUNGS TWICE DAILY 60 each 0   zolpidem (AMBIEN) 10 MG tablet Take 10 mg by mouth at bedtime as needed for sleep.     No current facility-administered medications on file prior to visit.     PAST MEDICAL HISTORY: Past Medical History:  Diagnosis Date   Alcohol abuse    Anemia    Asthma    Blood in stool    Colon polyps    COPD (chronic obstructive pulmonary disease) (HCC)    Depression    Depression    GERD (gastroesophageal reflux disease)    Heart murmur    History of alcohol abuse    History of blood transfusion 2006   HLD (hyperlipidemia)    Obesity    Sleep apnea    Thyroid disease     PAST SURGICAL HISTORY: Past Surgical History:  Procedure Laterality Date   AUGMENTATION MAMMAPLASTY     BREAST BIOPSY     LAPAROSCOPIC GASTRIC BANDING  2010    SOCIAL HISTORY: Social History   Tobacco Use   Smoking status: Former Smoker   Smokeless tobacco: Never Used  Substance Use Topics   Alcohol use: No    Frequency: Never   Drug use: No    FAMILY HISTORY: Family  History  Problem Relation Age of Onset   Alcohol abuse Mother    Depression Mother    Early death Mother    Liver disease Mother    Alcohol abuse Father    Obesity Father    Cancer Father    ROS: Review of Systems  Cardiovascular: Negative for chest pain.  Gastrointestinal: Negative for nausea and vomiting.  Musculoskeletal:       Negative for muscle weakness.   PHYSICAL EXAM: Pt in no acute distress  RECENT LABS AND TESTS: BMET    Component Value Date/Time   NA 142 02/02/2019 1527   K 4.6 02/02/2019 1527   CL 101 02/02/2019 1527   CO2 21 02/02/2019 1527   GLUCOSE 101 (H) 02/02/2019 1527   GLUCOSE 108 (H) 05/23/2018 0853   BUN 14 02/02/2019 1527   CREATININE 0.65 02/02/2019 1527   CALCIUM 9.1 02/02/2019 1527  GFRNONAA 98 02/02/2019 1527   GFRAA 113 02/02/2019 1527   Lab Results  Component Value Date   HGBA1C 5.9 (H) 02/02/2019   HGBA1C 5.9 (H) 09/25/2018   HGBA1C 6.0 (H) 06/23/2018   Lab Results  Component Value Date   INSULIN 13.7 02/02/2019   INSULIN 13.5 09/25/2018   INSULIN 10.9 06/05/2018   CBC    Component Value Date/Time   WBC 6.2 06/05/2018 0937   RBC 5.23 06/05/2018 0937   HGB 15.2 06/05/2018 0937   HCT 45.3 06/05/2018 0937   MCV 87 06/05/2018 0937   MCH 29.1 06/05/2018 0937   MCHC 33.6 06/05/2018 0937   RDW 15.2 06/05/2018 0937   LYMPHSABS 2.1 06/05/2018 0937   EOSABS 0.1 06/05/2018 0937   BASOSABS 0.1 06/05/2018 0937   Iron/TIBC/Ferritin/ %Sat No results found for: IRON, TIBC, FERRITIN, IRONPCTSAT Lipid Panel     Component Value Date/Time   CHOL 245 (H) 02/02/2019 1527   TRIG 121 02/02/2019 1527   HDL 47 02/02/2019 1527   CHOLHDL 5 05/23/2018 0853   VLDL 16.6 05/23/2018 0853   LDLCALC 174 (H) 02/02/2019 1527   Hepatic Function Panel     Component Value Date/Time   PROT 6.7 02/02/2019 1527   ALBUMIN 4.2 02/02/2019 1527   AST 17 02/02/2019 1527   ALT 16 02/02/2019 1527   ALKPHOS 98 02/02/2019 1527   BILITOT 0.3  02/02/2019 1527      Component Value Date/Time   TSH 4.450 06/05/2018 0937   TSH 3.47 01/27/2018 1143   Results for NASHAYLA, TELLERIA ANN (MRN [id number]) as of 05/25/2019 16:04  Ref. Range 02/02/2019 15:27  Vitamin D, 25-Hydroxy Latest Ref Range: 30.0 - 100.0 ng/mL 46.2    I, Michaelene Song, am acting as Location manager for [employer], PA-C I, Abby Potash, PA-C have reviewed above note and agree with its content

## 2019-06-18 ENCOUNTER — Telehealth (INDEPENDENT_AMBULATORY_CARE_PROVIDER_SITE_OTHER): Payer: BC Managed Care – PPO | Admitting: Physician Assistant

## 2019-06-18 ENCOUNTER — Other Ambulatory Visit: Payer: Self-pay

## 2019-06-18 ENCOUNTER — Encounter (INDEPENDENT_AMBULATORY_CARE_PROVIDER_SITE_OTHER): Payer: Self-pay | Admitting: Physician Assistant

## 2019-06-18 DIAGNOSIS — E559 Vitamin D deficiency, unspecified: Secondary | ICD-10-CM

## 2019-06-18 DIAGNOSIS — E7849 Other hyperlipidemia: Secondary | ICD-10-CM | POA: Diagnosis not present

## 2019-06-18 DIAGNOSIS — Z6841 Body Mass Index (BMI) 40.0 and over, adult: Secondary | ICD-10-CM

## 2019-06-18 MED ORDER — VITAMIN D (ERGOCALCIFEROL) 1.25 MG (50000 UNIT) PO CAPS: 50000.0000 [IU] | ORAL_CAPSULE | ORAL | 0 refills | Status: DC

## 2019-06-22 NOTE — Progress Notes (Signed)
Office: [phone]  /  Fax: [phone] TeleHealth Visit:  Pamela OharaDorothy Ann [name] has verbally consented to this TeleHealth visit today. The patient is located at home, the provider is located at the [facility]. The participants in this visit include the listed provider and patient. The visit was conducted today via face time.  HPI:   Chief Complaint: OBESITY Pamela CellaDorothy is here to discuss her progress with her obesity treatment plan. She is on the Category 1 plan and is following her eating plan approximately 75 % of the time. She states she is doing water aerobics for 60 minutes 2 times per week. Pamela CellaDorothy reports that she has been ordering takeout more often and that she has not been journaling. She states her most recent weight is 267.  We were unable to weigh the patient today for this TeleHealth visit. She feels as if she has gained 3 lbs since her last visit. She has lost 28 lbs since starting treatment with us.  Vitamin D Deficiency Pamela CellaDorothy has a diagnosis of vitamin D deficiency. She is currently taking prescription Vit D and denies nausea, vomiting or muscle weakness.  Hyperlipidemia Pamela CellaDorothy has hyperlipidemia and has been trying to improve her cholesterol levels with intensive lifestyle modification including a low saturated fat diet, exercise and weight loss. She is not on any medications. She denies any chest pain, claudication or myalgias.  ASSESSMENT AND PLAN:  Vitamin D deficiency - Plan: Vitamin D, Ergocalciferol, ([name]) 1.25 MG (50000 UT) CAPS capsule  Other hyperlipidemia  Class 3 severe obesity with serious comorbidity and body mass index (BMI) of 45.0 to 49.9 in adult, unspecified obesity type (HCC)  PLAN:  Vitamin D Deficiency Pamela CellaDorothy was informed that low vitamin D levels contributes to fatigue and are associated with obesity, breast, and colon cancer. Taelar agrees to continue taking prescription Vit D 50,000 IU every week #4 and we will  refill for 1 month. She will follow up for routine testing of vitamin D, at least 2-3 times per year. She was informed of the risk of over-replacement of vitamin D and agrees to not increase her dose unless she discusses this with us first. Pamela CellaDorothy agrees to follow up with our clinic in 2 weeks.  Hyperlipidemia Pamela CellaDorothy was informed of the American Heart Association Guidelines emphasizing intensive lifestyle modifications as the first line treatment for hyperlipidemia. We discussed many lifestyle modifications today in depth, and Pamela CellaDorothy will continue to work on decreasing saturated fats such as fatty red meat, butter and many fried foods. She will also increase vegetables and lean protein in her diet and continue to work on exercise and weight loss efforts. Otto agrees to follow up with our clinic in 2 weeks.  Obesity Pamela CellaDorothy is currently in the action stage of change. As such, her goal is to continue with weight loss efforts She has agreed to keep a food journal with 1300-1400 calories and 80 grams of protein daily Pamela CellaDorothy has been instructed to work up to a goal of 150 minutes of combined cardio and strengthening exercise per week for weight loss and overall health benefits. We discussed the following Behavioral Modification Strategies today: work on meal planning and easy cooking plans and keeping healthy foods in the home   Pamela CellaDorothy has agreed to follow up with our clinic in 2 weeks. She was informed of the importance of frequent follow up visits to maximize her success with intensive lifestyle modifications for her multiple health conditions.  ALLERGIES: No Known Allergies  MEDICATIONS: Current Outpatient Medications on File Prior to Visit  Medication Sig Dispense Refill  . albuterol (2.5 MG/3ML) 0.083% NEBU 3 mL, albuterol (5 MG/ML) 0.5% NEBU 0.5 mL Inhale 3 mg into the lungs as needed.    [name]. albuterol (PROVENTIL HFA;VENTOLIN HFA) 108 (90 Base) MCG/ACT inhaler Inhale 1-2 puffs into the  lungs every 6 (six) hours as needed for wheezing or shortness of breath. 1 Inhaler 3  . citalopram (CELEXA) 20 MG tablet Take 20 mg by mouth daily.    . clobetasol (TEMOVATE) 0.05 % external solution Apply 1 application topically 2 (two) times daily.    . INCRUSE ELLIPTA 62.5 MCG/INH AEPB INHALE 1 PUFF INTO THE LUNGS DAILY 30 each 5  . ketoconazole (NIZORAL) 2 % cream APPLY TO AREA ON LEFT ARM AND LEFT LEG DAILY FOR 14 DAYS 30 g 0  . levothyroxine (SYNTHROID, LEVOTHROID) 150 MCG tablet Take 1 tablet (150 mcg total) by mouth daily before breakfast. 30 tablet 3  . modafinil (PROVIGIL) 200 MG tablet Take 200 mg by mouth daily.    [name]. omeprazole (PRILOSEC) 40 MG capsule Take 40 mg by mouth daily.    . pseudoephedrine (SUDAFED) 30 MG tablet Take 1 tablet (30 mg total) by mouth every 6 (six) hours as needed for congestion. 20 tablet 0  . WIXELA INHUB 500-50 MCG/DOSE AEPB INHALE 1 PUFF INTO THE LUNGS TWICE DAILY 60 each 0  . zolpidem (AMBIEN) 10 MG tablet Take 10 mg by mouth at bedtime as needed for sleep.     No current facility-administered medications on file prior to visit.     PAST MEDICAL HISTORY: Past Medical History:  Diagnosis Date  . Alcohol abuse   . Anemia   . Asthma   . Blood in stool   . Colon polyps   . COPD (chronic obstructive pulmonary disease) (HCC)   . Depression   . Depression   . GERD (gastroesophageal reflux disease)   . Heart murmur   . History of alcohol abuse   . History of blood transfusion 2006  . HLD (hyperlipidemia)   . Obesity   . Sleep apnea   . Thyroid disease     PAST SURGICAL HISTORY: Past Surgical History:  Procedure Laterality Date  . AUGMENTATION MAMMAPLASTY    . BREAST BIOPSY    . LAPAROSCOPIC GASTRIC BANDING  2010    SOCIAL HISTORY: Social History   Tobacco Use  . Smoking status: Former Games developermoker  . Smokeless tobacco: Never Used  Substance Use Topics  . Alcohol use: No    Frequency: Never  . Drug use: No    FAMILY HISTORY: Family  History  Problem Relation Age of Onset  . Alcohol abuse Mother   . Depression Mother   . Early death Mother   . Liver disease Mother   . Alcohol abuse Father   . Obesity Father   . Cancer Father     ROS: Review of Systems  Constitutional: Negative for weight loss.  Cardiovascular: Negative for chest pain and claudication.  Gastrointestinal: Negative for nausea and vomiting.  Musculoskeletal: Negative for myalgias.       Negative muscle weakness    PHYSICAL EXAM: Pt in no acute distress  RECENT LABS AND TESTS: BMET    Component Value Date/Time   NA 142 02/02/2019 1527   K 4.6 02/02/2019 1527   CL 101 02/02/2019 1527   CO2 21 02/02/2019 1527   GLUCOSE 101 (H) 02/02/2019 1527   GLUCOSE 108 (H) 05/23/2018  0853   BUN 14 02/02/2019 1527   CREATININE 0.65 02/02/2019 1527   CALCIUM 9.1 02/02/2019 1527   GFRNONAA 98 02/02/2019 1527   GFRAA 113 02/02/2019 1527   Lab Results  Component Value Date   HGBA1C 5.9 (H) 02/02/2019   HGBA1C 5.9 (H) 09/25/2018   HGBA1C 6.0 (H) 06/23/2018   Lab Results  Component Value Date   INSULIN 13.7 02/02/2019   INSULIN 13.5 09/25/2018   INSULIN 10.9 06/05/2018   CBC    Component Value Date/Time   WBC 6.2 06/05/2018 0937   RBC 5.23 06/05/2018 0937   HGB 15.2 06/05/2018 0937   HCT 45.3 06/05/2018 0937   MCV 87 06/05/2018 0937   MCH 29.1 06/05/2018 0937   MCHC 33.6 06/05/2018 0937   RDW 15.2 06/05/2018 0937   LYMPHSABS 2.1 06/05/2018 0937   EOSABS 0.1 06/05/2018 0937   BASOSABS 0.1 06/05/2018 0937   Iron/TIBC/Ferritin/ %Sat No results found for: IRON, TIBC, FERRITIN, IRONPCTSAT Lipid Panel     Component Value Date/Time   CHOL 245 (H) 02/02/2019 1527   TRIG 121 02/02/2019 1527   HDL 47 02/02/2019 1527   CHOLHDL 5 05/23/2018 0853   VLDL 16.6 05/23/2018 0853   LDLCALC 174 (H) 02/02/2019 1527   Hepatic Function Panel     Component Value Date/Time   PROT 6.7 02/02/2019 1527   ALBUMIN 4.2 02/02/2019 1527   AST 17 02/02/2019  1527   ALT 16 02/02/2019 1527   ALKPHOS 98 02/02/2019 1527   BILITOT 0.3 02/02/2019 1527      Component Value Date/Time   TSH 4.450 06/05/2018 0937   TSH 3.47 01/27/2018 1143      I, Trixie Dredge, am acting as transcriptionist for Abby Potash, PA-C I, Abby Potash, PA-C have reviewed above note and agree with its content

## 2019-07-10 NOTE — Pre-Procedure Instructions (Signed)
Pamela [name]  07/10/2019       Your procedure is scheduled on August 20.  Report to [facility] Admitting at 05:30 A.M.  Call this number if you have problems the morning of surgery:  [phone]   Remember:  Do not eat or drink after midnight.  You may drink clear liquids until 4:30 am.  Clear liquids allowed are:                    Water, Juice (non-citric and without pulp), Carbonated beverages, Clear Tea, Black Coffee only, Plain Jell-O only, Gatorade and Plain Popsicles only    Take these medicines the morning of surgery with A SIP OF WATER Wixela, Omeprazole, Levothyroxine, Ellipta, Citalopram    Do not wear jewelry, make-up or nail polish.  Do not wear lotions, powders, or perfumes, or deodorant.  Do not shave 48 hours prior to surgery.  Men may shave face and neck.  Do not bring valuables to the hospital.  [facility] is not responsible for any belongings or valuables.  Contacts, dentures or bridgework may not be worn into surgery.  Leave your suitcase in the car.  After surgery it may be brought to your room.  For patients admitted to the hospital, discharge time will be determined by your treatment team.  Patients discharged the day of surgery will not be allowed to drive home.   [place] - Preparing for Surgery  Before surgery, you can play an important role.  Because skin is not sterile, your skin needs to be as free of germs as possible.  You can reduce the number of germs on you skin by washing with CHG (chlorahexidine gluconate) soap before surgery.  CHG is an antiseptic cleaner which kills germs and bonds with the skin to continue killing germs even after washing.  Please DO NOT use if you have an allergy to CHG or antibacterial soaps.  If your skin becomes reddened/irritated stop using the CHG and inform your nurse when you arrive at Short Stay.  Do not shave (including legs and underarms) for at least 48 hours prior to the first CHG shower.   You may shave your face.  Please follow these instructions carefully:   1.  Shower with CHG Soap the night before surgery and the morning of Surgery.  2.  If you choose to wash your hair, wash your hair first as usual with your normal shampoo.  3.  After you shampoo, rinse your hair and body thoroughly to remove the shampoo.  4.  Use CHG as you would any other liquid soap.  You can apply CHG directly to the skin and wash gently with scrungie or a clean washcloth.  5.  Apply the CHG Soap to your body ONLY FROM THE NECK DOWN.  Do not use on open wounds or open sores.  Avoid contact with your eyes, ears, mouth and genitals (private parts).  Wash genitals (private parts) with your normal soap.  6.  Wash thoroughly, paying special attention to the area where your surgery will be performed.  7.  Thoroughly rinse your body with warm water from the neck down.  8.  DO NOT shower/wash with your normal soap after using and rinsing off the CHG Soap.  9.  Pat yourself dry with a clean towel.            10.  Wear clean pajamas.  11.  Place clean sheets on your bed the night of your first shower and do not sleep with pets.  Day of Surgery  Do not apply any lotions the morning of surgery.  Please wear clean clothes to the hospital/surgery center.   Please read over the following fact sheets that you were given. Pain Booklet and Coughing and Deep Breathing

## 2019-07-13 ENCOUNTER — Other Ambulatory Visit (HOSPITAL_COMMUNITY)
Admission: RE | Admit: 2019-07-13 | Discharge: 2019-07-13 | Disposition: A | Discharge status: Home / Self Care | Payer: BC Managed Care – PPO | Source: Ambulatory Visit | Attending: Surgery | Admitting: Surgery

## 2019-07-13 ENCOUNTER — Encounter (HOSPITAL_COMMUNITY): Payer: Self-pay

## 2019-07-13 ENCOUNTER — Other Ambulatory Visit: Payer: Self-pay

## 2019-07-13 ENCOUNTER — Encounter (HOSPITAL_COMMUNITY)
Admission: RE | Admit: 2019-07-13 | Discharge: 2019-07-13 | Disposition: A | Discharge status: Home / Self Care | Payer: BC Managed Care – PPO | Source: Ambulatory Visit | Attending: Surgery | Admitting: Surgery

## 2019-07-13 DIAGNOSIS — F329 Major depressive disorder, single episode, unspecified: Secondary | ICD-10-CM | POA: Insufficient documentation

## 2019-07-13 DIAGNOSIS — G4733 Obstructive sleep apnea (adult) (pediatric): Secondary | ICD-10-CM | POA: Diagnosis not present

## 2019-07-13 DIAGNOSIS — J449 Chronic obstructive pulmonary disease, unspecified: Secondary | ICD-10-CM | POA: Insufficient documentation

## 2019-07-13 DIAGNOSIS — K439 Ventral hernia without obstruction or gangrene: Secondary | ICD-10-CM | POA: Insufficient documentation

## 2019-07-13 DIAGNOSIS — K219 Gastro-esophageal reflux disease without esophagitis: Secondary | ICD-10-CM | POA: Diagnosis not present

## 2019-07-13 DIAGNOSIS — Z87891 Personal history of nicotine dependence: Secondary | ICD-10-CM | POA: Insufficient documentation

## 2019-07-13 DIAGNOSIS — R9431 Abnormal electrocardiogram [ECG] [EKG]: Secondary | ICD-10-CM | POA: Diagnosis not present

## 2019-07-13 DIAGNOSIS — E079 Disorder of thyroid, unspecified: Secondary | ICD-10-CM | POA: Diagnosis not present

## 2019-07-13 DIAGNOSIS — Z6841 Body Mass Index (BMI) 40.0 and over, adult: Secondary | ICD-10-CM | POA: Insufficient documentation

## 2019-07-13 DIAGNOSIS — Z20828 Contact with and (suspected) exposure to other viral communicable diseases: Secondary | ICD-10-CM | POA: Diagnosis not present

## 2019-07-13 DIAGNOSIS — Z79890 Hormone replacement therapy (postmenopausal): Secondary | ICD-10-CM | POA: Diagnosis not present

## 2019-07-13 DIAGNOSIS — J45909 Unspecified asthma, uncomplicated: Secondary | ICD-10-CM | POA: Diagnosis not present

## 2019-07-13 DIAGNOSIS — Z01818 Encounter for other preprocedural examination: Secondary | ICD-10-CM | POA: Insufficient documentation

## 2019-07-13 DIAGNOSIS — Z79899 Other long term (current) drug therapy: Secondary | ICD-10-CM | POA: Insufficient documentation

## 2019-07-13 DIAGNOSIS — E669 Obesity, unspecified: Secondary | ICD-10-CM | POA: Diagnosis not present

## 2019-07-13 LAB — BASIC METABOLIC PANEL
Anion gap: 11 (ref 5–15)
BUN: 20 mg/dL (ref 6–20)
CO2: 23 mmol/L (ref 22–32)
Calcium: 9.2 mg/dL (ref 8.9–10.3)
Chloride: 103 mmol/L (ref 98–111)
Creatinine, Ser: 0.62 mg/dL (ref 0.44–1.00)
GFR calc Af Amer: >60 mL/min (ref >60)
GFR calc non Af Amer: >60 mL/min (ref >60)
Glucose, Bld: 122 mg/dL — ABNORMAL HIGH (ref 70–99)
Potassium: 4.2 mmol/L (ref 3.5–5.1)
Sodium: 137 mmol/L (ref 135–145)

## 2019-07-13 LAB — CBC
HCT: 47.8 % — ABNORMAL HIGH (ref 36.0–46.0)
Hemoglobin: 15.7 g/dL — ABNORMAL HIGH (ref 12.0–15.0)
MCH: 29.5 pg (ref 26.0–34.0)
MCHC: 32.8 g/dL (ref 30.0–36.0)
MCV: 89.7 fL (ref 80.0–100.0)
Platelets: 359 10*3/uL (ref 150–400)
RBC: 5.33 MIL/uL — ABNORMAL HIGH (ref 3.87–5.11)
RDW: 14.3 % (ref 11.5–15.5)
WBC: 7.1 10*3/uL (ref 4.0–10.5)
nRBC: 0 % (ref 0.0–0.2)

## 2019-07-13 LAB — SARS CORONAVIRUS 2 (TAT 6-24 HRS): SARS Coronavirus 2: NEGATIVE

## 2019-07-13 NOTE — Pre-Procedure Instructions (Addendum)
Pamela [name]  07/13/2019       Your procedure is scheduled on August 20.  Report to [facility] Admitting at 05:30 A.M.  Call this number if you have problems the morning of surgery:  [phone]   Remember:  Do not eat or drink after midnight.  You may drink clear liquids until 4:30 am.  Clear liquids allowed are: Water, Juice (non-citric and without pulp), Carbonated beverages, Clear Tea, Black Coffee only, Plain Jell-O only, Gatorade and Plain Popsicles only    Take these medicines the morning of surgery with A SIP OF WATER Wixela, Omeprazole, Levothyroxine, Ellipta, Citalopram  7 days prior to surgery STOP taking any Aspirin(unless otherwise instructed by your surgeon), Aleve, Naproxen, Ibuprofen, Motrin, Advil, Goody's, BC's, all herbal medications, fish oil, and all vitamins     Do not wear jewelry, make-up or nail polish.  Do not wear lotions, powders, or perfumes, or deodorant.  Do not shave 48 hours prior to surgery.  Men may shave face and neck.  Do not bring valuables to the hospital.  [facility] is not responsible for any belongings or valuables.  Contacts, dentures or bridgework may not be worn into surgery.  Leave your suitcase in the car.  After surgery it may be brought to your room.  For patients admitted to the hospital, discharge time will be determined by your treatment team.  Patients discharged the day of surgery will not be allowed to drive home.   [place] - Preparing for Surgery  Before surgery, you can play an important role.  Because skin is not sterile, your skin needs to be as free of germs as possible.  You can reduce the number of germs on you skin by washing with CHG (chlorahexidine gluconate) soap before surgery.  CHG is an antiseptic cleaner which kills germs and bonds with the skin to continue killing germs even after washing.  Please DO NOT use if you have an allergy to CHG or antibacterial soaps.  If your skin becomes  reddened/irritated stop using the CHG and inform your nurse when you arrive at Short Stay.  Do not shave (including legs and underarms) for at least 48 hours prior to the first CHG shower.  You may shave your face.  Please follow these instructions carefully:   1.  Shower with CHG Soap the night before surgery and the morning of Surgery.  2.  If you choose to wash your hair, wash your hair first as usual with your normal shampoo.  3.  After you shampoo, rinse your hair and body thoroughly to remove the shampoo.  4.  Use CHG as you would any other liquid soap.  You can apply CHG directly to the skin and wash gently with scrungie or a clean washcloth.  5.  Apply the CHG Soap to your body ONLY FROM THE NECK DOWN.  Do not use on open wounds or open sores.  Avoid contact with your eyes, ears, mouth and genitals (private parts).  Wash genitals (private parts) with your normal soap.  6.  Wash thoroughly, paying special attention to the area where your surgery will be performed.  7.  Thoroughly rinse your body with warm water from the neck down.  8.  DO NOT shower/wash with your normal soap after using and rinsing off the CHG Soap.  9.  Pat yourself dry with a clean towel.            10.  Wear  clean pajamas.            11.  Place clean sheets on your bed the night of your first shower and do not sleep with pets.  Day of Surgery  Do not apply any lotions the morning of surgery.  Please wear clean clothes to the hospital/surgery center.   Please read over the following fact sheets that you were given.

## 2019-07-13 NOTE — Progress Notes (Signed)
PCP - Riki Sheer Cardiologist - denies  Chest x-ray - N/A EKG - 07/13/19 Stress Test [facility], records [facility] requested Cardiac Cath - denies  Sleep Study - 2015 CPAP - uses QHS, pressure setting 5.0    Aspirin Instructions: Patient instructed to hold all Aspirin, NSAID's, herbal medications, fish oil and vitamins 7 days prior to surgery.   Anesthesia review: records requested  Patient denies shortness of breath, fever, cough and chest pain at PAT appointment   Patient verbalized understanding of instructions that were given to them at the PAT appointment. Patient was also instructed that they will need to review over the PAT instructions again at home before surgery.

## 2019-07-14 ENCOUNTER — Encounter (HOSPITAL_COMMUNITY): Payer: Self-pay

## 2019-07-14 NOTE — Anesthesia Preprocedure Evaluation (Addendum)
Anesthesia Evaluation  Patient identified by MRN, date of birth, ID band Patient awake    Reviewed: Allergy & Precautions, NPO status , Patient's Chart, lab work & pertinent test results  Airway Mallampati: II  TM Distance: >3 FB Neck ROM: Full    Dental no notable dental hx. (+) Poor Dentition, Missing   Pulmonary asthma , sleep apnea , COPD, Current Smoker,    Pulmonary exam normal breath sounds clear to auscultation       Cardiovascular negative cardio ROS Normal cardiovascular exam Rhythm:Regular Rate:Normal     Neuro/Psych Depression negative neurological ROS  negative psych ROS   GI/Hepatic Neg liver ROS, GERD  ,  Endo/Other  Hypothyroidism Morbid obesity  Renal/GU negative Renal ROS  negative genitourinary   Musculoskeletal negative musculoskeletal ROS (+)   Abdominal   Peds negative pediatric ROS (+)  Hematology negative hematology ROS (+)   Anesthesia Other Findings   Reproductive/Obstetrics negative OB ROS                           Anesthesia Physical Anesthesia Plan  ASA: III  Anesthesia Plan: General   Post-op Pain Management:    Induction: Intravenous  PONV Risk Score and Plan: 2 and Ondansetron, Midazolam and Treatment may vary due to age or medical condition  Airway Management Planned: Oral ETT  Additional Equipment:   Intra-op Plan:   Post-operative Plan: Extubation in OR  Informed Consent: I have reviewed the patients History and Physical, chart, labs and discussed the procedure including the risks, benefits and alternatives for the proposed anesthesia with the patient or authorized representative who has indicated his/her understanding and acceptance.     Dental advisory given  Plan Discussed with: CRNA  Anesthesia Plan Comments: (PAT note written 07/14/2019 by Myra Gianotti, PA-C. )       Anesthesia Quick Evaluation

## 2019-07-14 NOTE — Progress Notes (Signed)
Anesthesia Chart Review:  Case: [id number] Date/Time: 07/16/19 0715   Procedure: VENTRAL INCISIONAL HERNIA REPAIR WITH MESH (N/A )   Anesthesia type: General   Pre-op diagnosis: VENTRAL HERNIA   [facility] ROOM 02 / [facility] OR   Surgeon: Abigail MiyamotoBlackman, Douglas, MD      DISCUSSION: Patient is a 58 year old female scheduled for the above procedure.  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 BMI is consistent with morbid obesity.  She denied SOB, cough, fever, chest pain at PAT RN visit. I called and spoke with her regarding murmur history. She says she was told she was told she had a murmur in her child/teen years, but has never been told she needs cardiology evaluation for this. Prior to her bariatric surgery she had a stress test (and believes also an echo) [facility] and was told these were okay. She is morbidly obese with asthma and reports chronic DOE (particular with walking up stairs), but says this has been stable. No new changes in energy level. No chest pain, edema, syncope. She goes to water aerobics typically 3x/week, walks her dog, and can do light house cleaning.  No stress or [facility]. No coronary calcifications seen on 03/05/16 chest CT. Her preoperative EKG appears stable.  07/13/2019 presurgical COVID-19 test was negative. Based on currently available information, I would anticipate that she can proceed as planned. Anesthesia team to evaluate on the day of surgery.   VS: BP 132/72   Pulse 81   Temp 36.9 C   Resp 20   Ht 5\' 4"  (1.626 m)   Wt 122.6 kg   SpO2 96%   BMI 46.38 kg/m    PROVIDERS: Sharlene DoryWendling, Nicholas Paul, DO his PCP (established since 01/27/18). [facility] and has lost 42 lbs.    LABS:  Labs reviewed: Acceptable for surgery. (all labs ordered are listed, but only abnormal results are displayed)  Labs Reviewed  BASIC METABOLIC PANEL - Abnormal; Notable for the following components:      Result Value   Glucose, Bld 122 (*)    All other components within normal limits  CBC - Abnormal; Notable for the following components:   RBC 5.33 (*)    Hemoglobin 15.7 (*)    HCT 47.8 (*)    All other components within normal limits     IMAGES: Abdominal US 04/13/19: IMPRESSION: Positive exam for anterior abdominal wall ventral hernia containing fat by ultrasound.   EKG: 07/13/19: Normal sinus rhythm Septal infarct , age undetermined Abnormal ECG Confirmed by Zoila ShutterHilty, Kenneth [phone]([id number]) on 07/13/2019 1:19:07 PM - EKG appears stable when compared to tracing from 06/05/18    CV: She reported prior stress and echo > 10 [facility] that were okay, but no records of this studies currently available.   Past Medical History:  Diagnosis Date  . Anemia   . Asthma   . Blood in stool   . Colon polyps   . COPD (chronic obstructive pulmonary disease) (HCC)   . Depression   . Depression   . GERD (gastroesophageal reflux disease)   . Heart murmur    childhood  . 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 History of blood transfusion 2006  . HLD (hyperlipidemia)   .  Obesity   . Sleep apnea   . Thyroid disease     Past Surgical History:  Procedure Laterality Date  . AUGMENTATION MAMMAPLASTY    . BREAST BIOPSY    . LAPAROSCOPIC GASTRIC BANDING  2010    MEDICATIONS: . albuterol (PROVENTIL HFA;VENTOLIN HFA) 108 (90 Base) MCG/ACT inhaler  . citalopram (CELEXA) 20 MG tablet  . clobetasol (TEMOVATE) 0.05 % external solution  . INCRUSE ELLIPTA 62.5 MCG/INH AEPB  . ketoconazole (NIZORAL) 2 % cream  . levothyroxine (SYNTHROID, LEVOTHROID) 150 MCG tablet  . Melatonin 5 MG TABS  . modafinil (PROVIGIL) 200 MG tablet  . omeprazole (PRILOSEC) 40 MG capsule   . Vitamin D, Ergocalciferol, ([name]) 1.25 MG (50000 UT) CAPS capsule  . WIXELA INHUB 500-50 MCG/DOSE AEPB   No current facility-administered medications for this encounter.     Myra Gianotti, PA-C Surgical Short Stay/Anesthesiology [facility] Phone [phone] [facility] Phone [phone] 07/14/2019 12:03 PM

## 2019-07-15 MED ORDER — DEXTROSE 5 % IV SOLN
3.0000 g | INTRAVENOUS | Status: AC
  Administered 2019-07-16: 08:00:00 3 g via INTRAVENOUS
  Filled 2019-07-15: qty 3

## 2019-07-15 NOTE — H&P (Signed)
Pamela [name]  Location: [facility] Patient #: [id number] DOB: 1961-05-14 Single / Language: Undefined / Race: Refused to Report/Unreported Female   History of Present Illness   The patient is a 58 year old female who presents for an evaluation of a hernia.This patient is referred by Dr. Riki Sheer for evaluation of a ventral hernia. The patient has had a previous laparoscopic band placed in 2010 and [facility]. Approximate 4 months ago, she started having discomfort above the umbilicus. She noticed a protrusion. She initially thought it was at her port site. She has no nausea, vomiting, or instructed symptoms. She will get occasional mild shortness discomfort. She is otherwise without complaints. She is now seen at the weight loss Center here in [place]   Past Surgical History Malachi Bonds, [place];  Breast Augmentation  Bilateral. Breast Biopsy  Right. Lap Band   Diagnostic Studies History Malachi Bonds, CMA;  Colonoscopy  1-5 years ago Mammogram  1-3 years ago  Allergies Malachi Bonds, CMA No Known Drug Allergies  [05/08/2019]:  Medication History Malachi Bonds, CMA;  Citalopram Hydrobromide (20MG  Tablet, Oral) Active. Clobetasol Propionate (0.05% Solution, External) Active. Incruse Ellipta (62.5MCG/INH Aero Pow Br Act, Inhalation) Active. Ketoconazole (2% Cream, External) Active. Levothyroxine Sodium (150MCG Tablet, Oral) Active. Modafinil (200MG  Tablet, Oral) Active. Omeprazole (40MG  Capsule DR, Oral) Active. Vitamin D (Ergocalciferol) (1.25 MG(50000 UT) Capsule, Oral) Active. Albuterol Sulfate HFA (108 (90 Base)MCG/ACT Aerosol Soln, Inhalation) Active. Wixela Inhub (500-50MCG/DOSE Aero Pow Br Act, Inhalation) Active. Zolpidem Tartrate (10MG  Tablet, Oral) Active. Medications Reconciled  Social History Alcohol use  Remotely quit alcohol use. Caffeine use  Coffee, Tea. Illicit drug use  Remotely quit drug  use. Tobacco use  Current some day smoker.  Family History ( Alcohol Abuse  Father, Mother. Cancer  Father.  Pregnancy / Birth History Malachi Bonds, CMA;  Age at menarche  33 years. Age of menopause  40-50 Gravida  1 Maternal age  58-20 Para  1  Other Problems (Malachi Bonds, CMA;  Alcohol Abuse  Asthma  Chronic Obstructive Lung Disease  Depression  Hemorrhoids  Thyroid Disease     Review of Systems  General Not Present- Appetite Loss, Chills, Fatigue, Fever, Night Sweats, Weight Gain and Weight Loss. Skin Not Present- Change in Wart/Mole, Dryness, Hives, Jaundice, New Lesions, Non-Healing Wounds, Rash and Ulcer. HEENT Present- Wears glasses/contact lenses. Not Present- Earache, Hearing Loss, Hoarseness, Nose Bleed, Oral Ulcers, Ringing in the Ears, Seasonal Allergies, Sinus Pain, Sore Throat, Visual Disturbances and Yellow Eyes. Respiratory Present- Difficulty Breathing. Not Present- Bloody sputum, Chronic Cough, Snoring and Wheezing. Breast Not Present- Breast Mass, Breast Pain, Nipple Discharge and Skin Changes. Cardiovascular Not Present- Chest Pain, Difficulty Breathing Lying Down, Leg Cramps, Palpitations, Rapid Heart Rate, Shortness of Breath and Swelling of Extremities. Gastrointestinal Not Present- Abdominal Pain, Bloating, Bloody Stool, Change in Bowel Habits, Chronic diarrhea, Constipation, Difficulty Swallowing, Excessive gas, Gets full quickly at meals, Hemorrhoids, Indigestion, Nausea, Rectal Pain and Vomiting. Female Genitourinary Not Present- Frequency, Nocturia, Painful Urination, Pelvic Pain and Urgency. Musculoskeletal Not Present- Back Pain, Joint Pain, Joint Stiffness, Muscle Pain, Muscle Weakness and Swelling of Extremities. Neurological Not Present- Decreased Memory, Fainting, Headaches, Numbness, Seizures, Tingling, Tremor, Trouble walking and Weakness. Psychiatric Not Present- Anxiety, Bipolar, Change in Sleep Pattern, Depression, Fearful  and Frequent crying. Endocrine Not Present- Cold Intolerance, Excessive Hunger, Hair Changes, Heat Intolerance, Hot flashes and New Diabetes. Hematology Not Present- Blood Thinners, Easy Bruising, Excessive bleeding, Gland problems, HIV and Persistent Infections.  Vitals  Weight: 267.2 lb Height: 64in Body Surface Area: 2.21 m Body Mass Index: 45.86 kg/m  Temp.: 96.15F (Oral)  Pulse: 120 (Regular)  BP: 116/82(Sitting, Left Arm, Standard)    Physical Exam  General Mental Status-Alert. General Appearance-Consistent with stated age. Hydration-Well hydrated. Voice-Normal.  Head and Neck Head-normocephalic, atraumatic with no lesions or palpable masses. Trachea-midline.  Eye Eyeball - Bilateral-Extraocular movements intact. Sclera/Conjunctiva - Bilateral-No scleral icterus.  Chest and Lung Exam Chest and lung exam reveals -quiet, even and easy respiratory effort with no use of accessory muscles and on auscultation, normal breath sounds, no adventitious sounds and normal vocal resonance. Inspection Chest Wall - Normal. Back - normal.  Cardiovascular Cardiovascular examination reveals -normal heart sounds, regular rate and rhythm with no murmurs and normal pedal pulses bilaterally.  Abdomen Inspection Skin - Scar - no surgical scars. Hernias - Ventral - Note: There is a chronically incarcerated hernia above the umbilicus that is just below her port site from her laparoscopic band. It is mildly tender. Palpation/Percussion Palpation and Percussion of the abdomen reveal - Soft, Non Tender, No Rebound tenderness, No Rigidity (guarding) and No hepatosplenomegaly. Auscultation Auscultation of the abdomen reveals - Bowel sounds normal.  Neurologic - Did not examine.  Musculoskeletal - Did not examine.    Assessment & Plan   INCISIONAL HERNIA (K43.2)  Impression: This is a patient with a ventral incisional hernia. I discussed the diagnosis with  her in detail. I reviewed her ultrasound shows this is a hernia containing omentum. We discussed hernia repair with mesh. We discussed both the laparoscopic and open techniques. I believe that we do this through an small open incision below me to avoid her gastric band port and tubing. I would like to do this as an outpatient. I discussed the reasons for this with her in detail. I discussed use of mesh. I discussed the risks of surgery which includes but is not limited to bleeding, infection, injury to the port, hernia recurrence, the need for further procedures, cardiopulmonary issues, postoperative recovery, etc. She understands and wishes to proceed with surgery which will be scheduled.

## 2019-07-16 ENCOUNTER — Encounter (HOSPITAL_COMMUNITY)
Admission: RE | Disposition: A | Discharge status: Home / Self Care | Payer: Self-pay | Source: Home / Self Care | Attending: Surgery

## 2019-07-16 ENCOUNTER — Ambulatory Visit (HOSPITAL_COMMUNITY): Payer: BC Managed Care – PPO | Admitting: Vascular Surgery

## 2019-07-16 ENCOUNTER — Ambulatory Visit (HOSPITAL_COMMUNITY)
Admission: RE | Admit: 2019-07-16 | Discharge: 2019-07-16 | Disposition: A | Discharge status: Home / Self Care | Payer: BC Managed Care – PPO | Attending: Surgery | Admitting: Surgery

## 2019-07-16 ENCOUNTER — Encounter (HOSPITAL_COMMUNITY): Payer: Self-pay

## 2019-07-16 ENCOUNTER — Other Ambulatory Visit: Payer: Self-pay

## 2019-07-16 ENCOUNTER — Ambulatory Visit (HOSPITAL_COMMUNITY): Payer: BC Managed Care – PPO | Admitting: Anesthesiology

## 2019-07-16 DIAGNOSIS — K219 Gastro-esophageal reflux disease without esophagitis: Secondary | ICD-10-CM | POA: Insufficient documentation

## 2019-07-16 DIAGNOSIS — Z79899 Other long term (current) drug therapy: Secondary | ICD-10-CM | POA: Diagnosis not present

## 2019-07-16 DIAGNOSIS — K430 Incisional hernia with obstruction, without gangrene: Secondary | ICD-10-CM | POA: Insufficient documentation

## 2019-07-16 DIAGNOSIS — Z6841 Body Mass Index (BMI) 40.0 and over, adult: Secondary | ICD-10-CM | POA: Insufficient documentation

## 2019-07-16 DIAGNOSIS — G4730 Sleep apnea, unspecified: Secondary | ICD-10-CM | POA: Diagnosis not present

## 2019-07-16 DIAGNOSIS — Z79890 Hormone replacement therapy (postmenopausal): Secondary | ICD-10-CM | POA: Insufficient documentation

## 2019-07-16 DIAGNOSIS — F17200 Nicotine dependence, unspecified, uncomplicated: Secondary | ICD-10-CM | POA: Insufficient documentation

## 2019-07-16 DIAGNOSIS — F329 Major depressive disorder, single episode, unspecified: Secondary | ICD-10-CM | POA: Diagnosis not present

## 2019-07-16 DIAGNOSIS — E039 Hypothyroidism, unspecified: Secondary | ICD-10-CM | POA: Diagnosis not present

## 2019-07-16 DIAGNOSIS — K439 Ventral hernia without obstruction or gangrene: Secondary | ICD-10-CM | POA: Diagnosis present

## 2019-07-16 DIAGNOSIS — J449 Chronic obstructive pulmonary disease, unspecified: Secondary | ICD-10-CM | POA: Insufficient documentation

## 2019-07-16 HISTORY — PX: VENTRAL HERNIA REPAIR: SHX424

## 2019-07-16 HISTORY — PX: INSERTION OF MESH: SHX5868

## 2019-07-16 SURGERY — REPAIR, HERNIA, VENTRAL
Anesthesia: General | Site: Abdomen

## 2019-07-16 MED ORDER — MIDAZOLAM HCL 2 MG/2ML IJ SOLN
INTRAMUSCULAR | Status: DC | PRN
  Administered 2019-07-16: 2 mg via INTRAVENOUS

## 2019-07-16 MED ORDER — GABAPENTIN 300 MG PO CAPS
ORAL_CAPSULE | ORAL | Status: AC
  Administered 2019-07-16: 300 mg via ORAL
  Filled 2019-07-16: qty 1

## 2019-07-16 MED ORDER — STERILE WATER FOR IRRIGATION IR SOLN
Status: DC | PRN
  Administered 2019-07-16: 1000 mL

## 2019-07-16 MED ORDER — FENTANYL CITRATE (PF) 250 MCG/5ML IJ SOLN
INTRAMUSCULAR | Status: DC | PRN
  Administered 2019-07-16: 150 ug via INTRAVENOUS
  Administered 2019-07-16: 100 ug via INTRAVENOUS

## 2019-07-16 MED ORDER — LACTATED RINGERS IV SOLN
INTRAVENOUS | Status: DC | PRN
  Administered 2019-07-16: 07:00:00 via INTRAVENOUS

## 2019-07-16 MED ORDER — CHLORHEXIDINE GLUCONATE CLOTH 2 % EX PADS: 6.0000 | MEDICATED_PAD | Freq: Once | CUTANEOUS | Status: DC

## 2019-07-16 MED ORDER — BUPIVACAINE HCL (PF) 0.25 % IJ SOLN
INTRAMUSCULAR | Status: AC
  Filled 2019-07-16: qty 30

## 2019-07-16 MED ORDER — ROCURONIUM BROMIDE 10 MG/ML (PF) SYRINGE
PREFILLED_SYRINGE | INTRAVENOUS | Status: DC | PRN
  Administered 2019-07-16: 30 mg via INTRAVENOUS

## 2019-07-16 MED ORDER — EPHEDRINE SULFATE-NACL 50-0.9 MG/10ML-% IV SOSY
PREFILLED_SYRINGE | INTRAVENOUS | Status: DC | PRN
  Administered 2019-07-16: 15 mg via INTRAVENOUS

## 2019-07-16 MED ORDER — SUGAMMADEX SODIUM 200 MG/2ML IV SOLN
INTRAVENOUS | Status: DC | PRN
  Administered 2019-07-16: 200 mg via INTRAVENOUS

## 2019-07-16 MED ORDER — OXYCODONE HCL 5 MG PO TABS
ORAL_TABLET | ORAL | Status: AC
  Filled 2019-07-16: qty 1

## 2019-07-16 MED ORDER — ALBUMIN HUMAN 5 % IV SOLN
INTRAVENOUS | Status: DC | PRN
  Administered 2019-07-16: 08:00:00 via INTRAVENOUS

## 2019-07-16 MED ORDER — GABAPENTIN 300 MG PO CAPS
300.0000 mg | ORAL_CAPSULE | ORAL | Status: AC
  Administered 2019-07-16: 06:00:00 300 mg via ORAL

## 2019-07-16 MED ORDER — CELECOXIB 200 MG PO CAPS
ORAL_CAPSULE | ORAL | Status: AC
  Administered 2019-07-16: 200 mg via ORAL
  Filled 2019-07-16: qty 1

## 2019-07-16 MED ORDER — OXYCODONE HCL 5 MG PO TABS
5.0000 mg | ORAL_TABLET | Freq: Once | ORAL | Status: AC | PRN
  Administered 2019-07-16: 5 mg via ORAL

## 2019-07-16 MED ORDER — MIDAZOLAM HCL 2 MG/2ML IJ SOLN
INTRAMUSCULAR | Status: AC
  Filled 2019-07-16: qty 2

## 2019-07-16 MED ORDER — PROMETHAZINE HCL 25 MG/ML IJ SOLN: 6.2500 mg | INTRAMUSCULAR | Status: DC | PRN

## 2019-07-16 MED ORDER — HYDROMORPHONE HCL 1 MG/ML IJ SOLN
0.2500 mg | INTRAMUSCULAR | Status: DC | PRN
  Administered 2019-07-16 (×3): 0.25 mg via INTRAVENOUS

## 2019-07-16 MED ORDER — BUPIVACAINE HCL (PF) 0.25 % IJ SOLN
INTRAMUSCULAR | Status: DC | PRN
  Administered 2019-07-16: 20 mL

## 2019-07-16 MED ORDER — ACETAMINOPHEN 500 MG PO TABS
ORAL_TABLET | ORAL | Status: AC
  Administered 2019-07-16: 06:00:00 1000 mg via ORAL
  Filled 2019-07-16: qty 2

## 2019-07-16 MED ORDER — LIDOCAINE 2% (20 MG/ML) 5 ML SYRINGE
INTRAMUSCULAR | Status: DC | PRN
  Administered 2019-07-16: 100 mg via INTRAVENOUS

## 2019-07-16 MED ORDER — ACETAMINOPHEN 500 MG PO TABS
1000.0000 mg | ORAL_TABLET | ORAL | Status: AC
  Administered 2019-07-16: 06:00:00 1000 mg via ORAL

## 2019-07-16 MED ORDER — OXYCODONE HCL 5 MG PO TABS: 5.0000 mg | ORAL_TABLET | Freq: Four times a day (QID) | ORAL | 0 refills | Status: DC | PRN

## 2019-07-16 MED ORDER — ONDANSETRON HCL 4 MG/2ML IJ SOLN
INTRAMUSCULAR | Status: DC | PRN
  Administered 2019-07-16: 4 mg via INTRAVENOUS

## 2019-07-16 MED ORDER — SUCCINYLCHOLINE CHLORIDE 200 MG/10ML IV SOSY
PREFILLED_SYRINGE | INTRAVENOUS | Status: DC | PRN
  Administered 2019-07-16: 120 mg via INTRAVENOUS

## 2019-07-16 MED ORDER — DEXAMETHASONE SODIUM PHOSPHATE 10 MG/ML IJ SOLN
INTRAMUSCULAR | Status: DC | PRN
  Administered 2019-07-16: 5 mg via INTRAVENOUS

## 2019-07-16 MED ORDER — ALBUTEROL SULFATE HFA 108 (90 BASE) MCG/ACT IN AERS
INHALATION_SPRAY | RESPIRATORY_TRACT | Status: DC | PRN
  Administered 2019-07-16: 6 via RESPIRATORY_TRACT

## 2019-07-16 MED ORDER — CELECOXIB 200 MG PO CAPS
200.0000 mg | ORAL_CAPSULE | ORAL | Status: AC
  Administered 2019-07-16: 06:00:00 200 mg via ORAL

## 2019-07-16 MED ORDER — PROPOFOL 10 MG/ML IV BOLUS
INTRAVENOUS | Status: AC
  Filled 2019-07-16: qty 20

## 2019-07-16 MED ORDER — PROPOFOL 10 MG/ML IV BOLUS
INTRAVENOUS | Status: DC | PRN
  Administered 2019-07-16: 50 mg via INTRAVENOUS
  Administered 2019-07-16: 150 mg via INTRAVENOUS

## 2019-07-16 MED ORDER — OXYCODONE HCL 5 MG/5ML PO SOLN: 5.0000 mg | Freq: Once | ORAL | Status: AC | PRN

## 2019-07-16 MED ORDER — HYDROMORPHONE HCL 1 MG/ML IJ SOLN
INTRAMUSCULAR | Status: AC
  Filled 2019-07-16: qty 1

## 2019-07-16 MED ORDER — 0.9 % SODIUM CHLORIDE (POUR BTL) OPTIME
TOPICAL | Status: DC | PRN
  Administered 2019-07-16: 1000 mL

## 2019-07-16 MED ORDER — FENTANYL CITRATE (PF) 250 MCG/5ML IJ SOLN
INTRAMUSCULAR | Status: AC
  Filled 2019-07-16: qty 5

## 2019-07-16 MED ORDER — MEPERIDINE HCL 25 MG/ML IJ SOLN: 6.2500 mg | INTRAMUSCULAR | Status: DC | PRN

## 2019-07-16 SURGICAL SUPPLY — 38 items
BLADE CLIPPER SURG (BLADE) IMPLANT
CANISTER SUCT 3000ML PPV (MISCELLANEOUS) ×3 IMPLANT
CHLORAPREP W/TINT 26 (MISCELLANEOUS) ×3 IMPLANT
COVER SURGICAL LIGHT HANDLE (MISCELLANEOUS) ×3 IMPLANT
COVER WAND RF STERILE (DRAPES) ×3 IMPLANT
DERMABOND ADVANCED (GAUZE/BANDAGES/DRESSINGS) ×2
DERMABOND ADVANCED .7 DNX12 (GAUZE/BANDAGES/DRESSINGS) IMPLANT
DRAPE LAPAROSCOPIC ABDOMINAL (DRAPES) ×3 IMPLANT
DRSG TEGADERM 4X4.75 (GAUZE/BANDAGES/DRESSINGS) ×1 IMPLANT
DRSG TELFA 3X8 NADH (GAUZE/BANDAGES/DRESSINGS) IMPLANT
ELECT REM PT RETURN 9FT ADLT (ELECTROSURGICAL) ×3
ELECTRODE REM PT RTRN 9FT ADLT (ELECTROSURGICAL) ×1 IMPLANT
GAUZE SPONGE 4X4 12PLY STRL (GAUZE/BANDAGES/DRESSINGS) IMPLANT
GLOVE SURG SIGNA 7.5 PF LTX (GLOVE) ×3 IMPLANT
GOWN STRL REUS W/ TWL LRG LVL3 (GOWN DISPOSABLE) ×2 IMPLANT
GOWN STRL REUS W/ TWL XL LVL3 (GOWN DISPOSABLE) ×1 IMPLANT
GOWN STRL REUS W/TWL LRG LVL3 (GOWN DISPOSABLE) ×4
GOWN STRL REUS W/TWL XL LVL3 (GOWN DISPOSABLE) ×2
KIT BASIN OR (CUSTOM PROCEDURE TRAY) ×3 IMPLANT
KIT TURNOVER KIT B (KITS) ×3 IMPLANT
MESH VENTRALEX ST 1-7/10 CRC S (Mesh General) ×2 IMPLANT
NDL HYPO 25GX1X1/2 BEV (NEEDLE) ×1 IMPLANT
NEEDLE HYPO 25GX1X1/2 BEV (NEEDLE) ×3 IMPLANT
NS IRRIG 1000ML POUR BTL (IV SOLUTION) ×3 IMPLANT
PACK GENERAL/GYN (CUSTOM PROCEDURE TRAY) ×3 IMPLANT
PAD ARMBOARD 7.5X6 YLW CONV (MISCELLANEOUS) ×3 IMPLANT
PAD DRESSING TELFA 3X8 NADH (GAUZE/BANDAGES/DRESSINGS) IMPLANT
PENCIL SMOKE EVACUATOR (MISCELLANEOUS) ×3 IMPLANT
STAPLER VISISTAT 35W (STAPLE) IMPLANT
SUT MNCRL AB 4-0 PS2 18 (SUTURE) ×3 IMPLANT
SUT NOVA NAB DX-16 0-1 5-0 T12 (SUTURE) ×2 IMPLANT
SUT PROLENE 1 CT (SUTURE) IMPLANT
SUT VIC AB 3-0 SH 27 (SUTURE) ×2
SUT VIC AB 3-0 SH 27XBRD (SUTURE) ×1 IMPLANT
SYR CONTROL 10ML LL (SYRINGE) ×3 IMPLANT
TOWEL GREEN STERILE (TOWEL DISPOSABLE) ×3 IMPLANT
TOWEL GREEN STERILE FF (TOWEL DISPOSABLE) ×3 IMPLANT
TRAY FOLEY MTR SLVR 14FR STAT (SET/KITS/TRAYS/PACK) IMPLANT

## 2019-07-16 NOTE — Op Note (Signed)
VENTRAL INCISIONAL HERNIA REPAIR WITH MESH, Insertion Of Mesh  Procedure Note  Pamela [name] 07/16/2019   Pre-op Diagnosis: VENTRAL HERNIA     Post-op Diagnosis: same  Procedure(s): VENTRAL INCISIONAL HERNIA REPAIR WITH MESH Insertion Of Mesh  Surgeon(s): Coralie Keens, MD  Anesthesia: General  Staff:  Circulator: Rosanne Sack, RN Scrub Person: Rozell Searing, RN  Estimated Blood Loss: Minimal               Findings: The patient was found to have a small ventral hernia with a fascial defect measuring only 1.5 to 2 cm in size containing incarcerated omentum.  It was repaired with a 4.3 cm round Prolene ventral patch   procedure: The patient was brought to the operating room and identifies correct patient.  She was placed upon the operating table general anesthesia was induced.  Her abdomen was then prepped and draped in usual sterile fashion.  I made a vertical incision several inches above the umbilicus over the palpable hernia defect.  I dissected down to the hernia sac with electrocautery.  I then excised the sac as well as some omentum and reduced the rest into the abdominal cavity.  I then brought a 4.3 cm round ventral Prolene patch onto the field.  I placed it through the fascia opening and then pulled it up against the peritoneum with the ties.  The mesh was then sutured in place circumferentially with #1 Novafil sutures.  I cut the ties and closed the fascia over the top of the mesh with a figure-of-eight #1 Novafil suture.  Wide coverage of the fascial defect and closure appeared to be achieved.  I anesthetized the skin and fascia with Marcaine.  Hemostasis appeared to be achieved.  I then closed the subcutaneous tissue with interrupted 3-0 Vicryl sutures and closed skin with a running 4-0 Monocryl.  Dermabond was then applied.  The patient tolerated the procedure well.  All the counts were correct at the end of the procedure.  The patient was then extubated in  the operating room and taken a stable condition to the recovery room.          Coralie Keens   Date: 07/16/2019  Time: 8:10 AM

## 2019-07-16 NOTE — Interval H&P Note (Signed)
History and Physical Interval Note:no change in H and P  07/16/2019 8:14 AM  Pamela [name]  has presented today for surgery, with the diagnosis of VENTRAL HERNIA.  The various methods of treatment have been discussed with the patient and family. After consideration of risks, benefits and other options for treatment, the patient has consented to  Procedure(s): VENTRAL INCISIONAL HERNIA REPAIR WITH MESH (N/A) Insertion Of Mesh (N/A) as a surgical intervention.  The patient's history has been reviewed, patient examined, no change in status, stable for surgery.  I have reviewed the patient's chart and labs.  Questions were answered to the patient's satisfaction.     Coralie Keens

## 2019-07-16 NOTE — Anesthesia Procedure Notes (Addendum)
Procedure Name: Intubation Date/Time: 07/16/2019 7:35 AM Performed by: Elayne Snare, CRNA Pre-anesthesia Checklist: Patient identified, Emergency Drugs available, Suction available and Patient being monitored Patient Re-evaluated:Patient Re-evaluated prior to induction Oxygen Delivery Method: Circle System Utilized Preoxygenation: Pre-oxygenation with 100% oxygen Induction Type: IV induction and Rapid sequence Ventilation: Mask ventilation without difficulty Laryngoscope Size: Mac and 3 Grade View: Grade I Tube type: Oral Tube size: 7.0 mm Number of attempts: 1 Airway Equipment and Method: Stylet and Oral airway Placement Confirmation: ETT inserted through vocal cords under direct vision,  positive ETCO2 and breath sounds checked- equal and bilateral Secured at: 21 cm Tube secured with: Tape Dental Injury: Teeth and Oropharynx as per pre-operative assessment

## 2019-07-16 NOTE — Anesthesia Postprocedure Evaluation (Signed)
Anesthesia Post Note  Patient: Pamela [name]  Procedure(s) Performed: VENTRAL INCISIONAL HERNIA REPAIR WITH MESH (N/A Abdomen) Insertion Of Mesh (N/A Abdomen)     Patient location during evaluation: PACU Anesthesia Type: General Level of consciousness: awake and alert Pain management: pain level controlled Vital Signs Assessment: post-procedure vital signs reviewed and stable Respiratory status: spontaneous breathing, nonlabored ventilation and respiratory function stable Cardiovascular status: blood pressure returned to baseline and stable Postop Assessment: no apparent nausea or vomiting Anesthetic complications: no    Last Vitals:  Vitals:   07/16/19 0900 07/16/19 0915  BP: (!) 157/98 (!) 157/106  Pulse: 72 77  Resp: 16 16  Temp:  (!) 36.4 C  SpO2: 95% 94%    Last Pain:  Vitals:   07/16/19 0915  TempSrc:   PainSc: [place]

## 2019-07-16 NOTE — Transfer of Care (Signed)
Immediate Anesthesia Transfer of Care Note  Patient: Pamela [name]  Procedure(s) Performed: VENTRAL INCISIONAL HERNIA REPAIR WITH MESH (N/A Abdomen) Insertion Of Mesh (N/A Abdomen)  Patient Location: PACU  Anesthesia Type:General  Level of Consciousness: awake, alert  and patient cooperative  Airway & Oxygen Therapy: Patient Spontanous Breathing  Post-op Assessment: Report given to RN and Post -op Vital signs reviewed and stable  Post vital signs: Reviewed and stable  Last Vitals:  Vitals Value Taken Time  BP 161/102 07/16/19 0815  Temp    Pulse 87 07/16/19 0816  Resp 21 07/16/19 0816  SpO2 93 % 07/16/19 0816  Vitals shown include unvalidated device data.  Last Pain:  Vitals:   07/16/19 0609  TempSrc: Oral  PainSc: 0-No pain      Patients Stated Pain Goal: 3 (22/33/61 2244)  Complications: No apparent anesthesia complications

## 2019-07-16 NOTE — Discharge Instructions (Signed)
[facility] _____[facility]  UMBILICAL OR INGUINAL HERNIA REPAIR: POST OP INSTRUCTIONS  Always review your discharge instruction sheet given to you by the facility where your surgery was performed. IF YOU HAVE DISABILITY OR FAMILY LEAVE FORMS, YOU MUST BRING THEM TO THE OFFICE FOR PROCESSING.   DO NOT GIVE THEM TO YOUR DOCTOR.  1. A  prescription for pain medication may be given to you upon discharge.  Take your pain medication as prescribed, if needed.  If narcotic pain medicine is not needed, then you may take acetaminophen (Tylenol) or ibuprofen (Advil) as needed. 2. Take your usually prescribed medications unless otherwise directed. If you need a refill on your pain medication, please contact your pharmacy.  They will contact our office to request authorization. Prescriptions will not be filled after 5 pm or on week-ends. 3. You should follow a light diet the first 24 hours after arrival home, such as soup and crackers, etc.  Be sure to include lots of fluids daily.  Resume your normal diet the day after surgery. 4.Most patients will experience some swelling and bruising around the umbilicus or in the groin and scrotum.  Ice packs and reclining will help.  Swelling and bruising can take several days to resolve.  6. It is common to experience some constipation if taking pain medication after surgery.  Increasing fluid intake and taking a stool softener (such as Colace) will usually help or prevent this problem from occurring.  A mild laxative (Milk of Magnesia or Miralax) should be taken according to package directions if there are no bowel movements after 48 hours. 7. Unless discharge instructions indicate otherwise, you may remove your bandages 24-48 hours after surgery, and you may shower at that time.  You may have steri-strips (small skin tapes) in place directly over the incision.  These strips should be left on the skin for 7-10 days.  If your surgeon used skin glue on the  incision, you may shower in 24 hours.  The glue will flake off over the next 2-3 weeks.  Any sutures or staples will be removed at the office during your follow-up visit. 8. ACTIVITIES:  You may resume regular (light) daily activities beginning the next day--such as daily self-care, walking, climbing stairs--gradually increasing activities as tolerated.  You may have sexual intercourse when it is comfortable.  Refrain from any heavy lifting or straining until approved by your doctor.  a.You may drive when you are no longer taking prescription pain medication, you can comfortably wear a seatbelt, and you can safely maneuver your car and apply brakes. b.RETURN TO WORK:   _____________________________________________  9.You should see your doctor in the office for a follow-up appointment approximately 2-3 weeks after your surgery.  Make sure that you call for this appointment within a day or two after you arrive home to insure a convenient appointment time. 10.OTHER INSTRUCTIONS: _OK TO SHOWER STARTING TOMORROW __________NO LIFTING MORE THAN 15 TO 20 POUNDS FOR 4 WEEKS ICE PACK, TYLENOL, IBUPROFEN ALSO FOR PAIN______________    _____________________________________  WHEN TO CALL YOUR DOCTOR: 1. Fever over 101.0 2. Inability to urinate 3. Nausea and/or vomiting 4. Extreme swelling or bruising 5. Continued bleeding from incision. 6. Increased pain, redness, or drainage from the incision  The clinic staff is available to answer your questions during regular business hours.  Please dont hesitate to call and ask to speak to one of the nurses for clinical concerns.  If you have a medical emergency, go to the  the nearest emergency room or call 911.  A surgeon from [facility] is always on call at the hospital   [address], [address], [place], [place]  [address] ?  [address] [address], [place], [place]   [address] [phone] ? [phone] ? FAX [phone] Web site:  [email or web address] 

## 2019-07-17 ENCOUNTER — Encounter (HOSPITAL_COMMUNITY): Payer: Self-pay | Admitting: Surgery

## 2019-07-27 ENCOUNTER — Telehealth (INDEPENDENT_AMBULATORY_CARE_PROVIDER_SITE_OTHER): Payer: BC Managed Care – PPO | Admitting: Physician Assistant

## 2019-07-27 ENCOUNTER — Other Ambulatory Visit: Payer: Self-pay

## 2019-07-27 ENCOUNTER — Encounter (INDEPENDENT_AMBULATORY_CARE_PROVIDER_SITE_OTHER): Payer: Self-pay | Admitting: Physician Assistant

## 2019-07-27 DIAGNOSIS — E559 Vitamin D deficiency, unspecified: Secondary | ICD-10-CM

## 2019-07-27 DIAGNOSIS — Z6841 Body Mass Index (BMI) 40.0 and over, adult: Secondary | ICD-10-CM | POA: Diagnosis not present

## 2019-07-28 NOTE — Progress Notes (Signed)
Office: [phone]  /  Fax: [phone] TeleHealth Visit:  Pamela OharaDorothy Ann [name] has verbally consented to this TeleHealth visit today. The patient is located at home, the provider is located at the [facility] office. The participants in this visit include the listed provider and patient and any and all parties involved. The visit was conducted today via FaceTime.  HPI:   Chief Complaint: OBESITY Pamela [name] is here to discuss her progress with her obesity treatment plan. She is on the Category 1 plan and is following her eating plan approximately 0 % of the time. She states she is exercising 0 minutes 0 times per week. Rahima just had hernia surgery eleven days ago, and she has not been eating on the plan. She has gotten back on track the last two days. We were unable to weigh the patient today for this TeleHealth visit. She feels as if she has maintained weight (weight not reported) since her last visit. She has lost 28 lbs since starting treatment with us.  Vitamin D deficiency Pamela [name] has a diagnosis of vitamin D deficiency. She is currently taking vit D and denies nausea, vomiting or muscle weakness.  ASSESSMENT AND PLAN:  Vitamin D deficiency  Class 3 severe obesity with serious comorbidity and body mass index (BMI) of 45.0 to 49.9 in adult, unspecified obesity type (HCC)  PLAN:  Vitamin D Deficiency Pamela [name] was informed that low vitamin D levels contributes to fatigue and are associated with obesity, breast, and colon cancer. She agrees to continue to take prescription Vit D @50 ,000 IU every week and will follow up for routine testing of vitamin D, at least 2-3 times per year. She was informed of the risk of over-replacement of vitamin D and agrees to not increase her dose unless she discusses this with us first.  Obesity Pamela [name] is currently in the action stage of change. As such, her goal is to continue with weight loss efforts She has agreed to follow the  Category 1 plan Pamela [name] has been instructed to work up to a goal of 150 minutes of combined cardio and strengthening exercise per week for weight loss and overall health benefits. We discussed the following Behavioral Modification Strategies today: Keeping healthy foods in the home and work on meal planning and easy cooking plans  Pamela [name] has agreed to follow up with our clinic in 2 weeks. She was informed of the importance of frequent follow up visits to maximize her success with intensive lifestyle modifications for her multiple health conditions.  ALLERGIES: No Known Allergies  MEDICATIONS: Current Outpatient Medications on File Prior to Visit  Medication Sig Dispense Refill   albuterol (PROVENTIL HFA;VENTOLIN HFA) 108 (90 Base) MCG/ACT inhaler Inhale 1-2 puffs into the lungs every 6 (six) hours as needed for wheezing or shortness of breath. 1 Inhaler 3   citalopram (CELEXA) 20 MG tablet Take 20 mg by mouth daily.     clobetasol (TEMOVATE) 0.05 % external solution Apply 1 application topically 2 (two) times daily as needed (irritation).      INCRUSE ELLIPTA 62.5 MCG/INH AEPB INHALE 1 PUFF INTO THE LUNGS DAILY (Patient taking differently: Inhale 1 puff into the lungs daily. ) 30 each 5   ketoconazole (NIZORAL) 2 % cream APPLY TO AREA ON LEFT ARM AND LEFT LEG DAILY FOR 14 DAYS (Patient taking differently: Apply 1 application topically daily as needed for irritation. APPLY TO AREA ON LEFT ARM AND LEFT LEG DAILY FOR 14 DAYS) 30 g 0   levothyroxine (  SYNTHROID, LEVOTHROID) 150 MCG tablet Take 1 tablet (150 mcg total) by mouth daily before breakfast. 30 tablet 3   Melatonin 5 MG TABS Take 5 mg by mouth at bedtime.     modafinil (PROVIGIL) 200 MG tablet Take 200 mg by mouth daily.     omeprazole (PRILOSEC) 40 MG capsule Take 40 mg by mouth daily as needed (heartburn).      oxyCODONE (OXY IR/ROXICODONE) 5 MG immediate release tablet Take 1 tablet (5 mg total) by mouth every 6 (six) hours as  needed for moderate pain, severe pain or breakthrough pain. 25 tablet 0   Vitamin D, Ergocalciferol, ([name]) 1.25 MG (50000 UT) CAPS capsule Take 1 capsule (50,000 Units total) by mouth every 7 (seven) days. 4 capsule 0   WIXELA INHUB 500-50 MCG/DOSE AEPB INHALE 1 PUFF INTO THE LUNGS TWICE DAILY (Patient taking differently: Inhale 1 puff into the lungs 2 (two) times daily. ) 60 each 0   No current facility-administered medications on file prior to visit.     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)   History of blood transfusion 2006   HLD (hyperlipidemia)    Obesity    Sleep apnea    Thyroid disease     PAST SURGICAL HISTORY: Past Surgical History:  Procedure Laterality Date   AUGMENTATION MAMMAPLASTY     BREAST BIOPSY     INSERTION OF MESH N/A 07/16/2019   Procedure: Insertion Of Mesh;  Surgeon: Abigail Miyamoto, MD;  [facility] OR;  Service: General;  Laterality: N/A;   LAPAROSCOPIC GASTRIC BANDING  2010   VENTRAL HERNIA REPAIR N/A 07/16/2019   Procedure: VENTRAL INCISIONAL HERNIA REPAIR WITH MESH;  Surgeon: Abigail Miyamoto, MD;  Location: [facility] OR;  Service: General;  Laterality: N/A;    SOCIAL HISTORY: Social History   Tobacco Use   Smoking status: Current Some Day Smoker    Packs/day: 0.25    Types: Cigarettes   Smokeless tobacco: Never Used  Substance Use Topics   Alcohol use: No    Frequency: Never    Comment: 13 years sober   Drug use: No    FAMILY HISTORY: Family History  Problem Relation Age of Onset   Alcohol abuse Mother    Depression Mother    Early death Mother    Liver disease Mother    Alcohol abuse Father    Obesity  Father    Cancer Father     ROS: Review of Systems  Constitutional: Negative for weight loss.  Gastrointestinal: Negative for nausea and vomiting.  Musculoskeletal:       Negative for muscle weakness    PHYSICAL EXAM: Pt in no acute distress  RECENT LABS AND TESTS: BMET    Component Value Date/Time   NA 137 07/13/2019 0853   NA 142 02/02/2019 1527   K 4.2 07/13/2019 0853   CL 103 07/13/2019 0853   CO2 23 07/13/2019 0853   GLUCOSE 122 (H) 07/13/2019 0853   BUN 20 07/13/2019 0853   BUN 14 02/02/2019 1527   CREATININE 0.62 07/13/2019 0853   CALCIUM 9.2 07/13/2019 0853  GFRNONAA >60 07/13/2019 0853   GFRAA >60 07/13/2019 0853   Lab Results  Component Value Date   HGBA1C 5.9 (H) 02/02/2019   HGBA1C 5.9 (H) 09/25/2018   HGBA1C 6.0 (H) 06/23/2018   Lab Results  Component Value Date   INSULIN 13.7 02/02/2019   INSULIN 13.5 09/25/2018   INSULIN 10.9 06/05/2018   CBC    Component Value Date/Time   WBC 7.1 07/13/2019 0853   RBC 5.33 (H) 07/13/2019 0853   HGB 15.7 (H) 07/13/2019 0853   HGB 15.2 06/05/2018 0937   HCT 47.8 (H) 07/13/2019 0853   HCT 45.3 06/05/2018 0937   PLT 359 07/13/2019 0853   MCV 89.7 07/13/2019 0853   MCV 87 06/05/2018 0937   MCH 29.5 07/13/2019 0853   MCHC 32.8 07/13/2019 0853   RDW 14.3 07/13/2019 0853   RDW 15.2 06/05/2018 0937   LYMPHSABS 2.1 06/05/2018 0937   EOSABS 0.1 06/05/2018 0937   BASOSABS 0.1 06/05/2018 0937   Iron/TIBC/Ferritin/ %Sat No results found for: IRON, TIBC, FERRITIN, IRONPCTSAT Lipid Panel     Component Value Date/Time   CHOL 245 (H) 02/02/2019 1527   TRIG 121 02/02/2019 1527   HDL 47 02/02/2019 1527   CHOLHDL 5 05/23/2018 0853   VLDL 16.6 05/23/2018 0853   LDLCALC 174 (H) 02/02/2019 1527   Hepatic Function Panel     Component Value Date/Time   PROT 6.7 02/02/2019 1527   ALBUMIN 4.2 02/02/2019 1527   AST 17 02/02/2019 1527   ALT 16 02/02/2019 1527   ALKPHOS 98 02/02/2019 1527   BILITOT 0.3 02/02/2019  1527      Component Value Date/Time   TSH 4.450 06/05/2018 0937   TSH 3.47 01/27/2018 1143     Ref. Range 02/02/2019 15:27  Vitamin D, 25-Hydroxy Latest Ref Range: 30.0 - 100.0 ng/mL 46.2    I, Doreene Nest, am acting as Location manager for Abby Potash, PA-C I, Abby Potash, PA-C have reviewed above note and agree with its content

## 2019-08-04 ENCOUNTER — Other Ambulatory Visit: Payer: Self-pay | Admitting: Family Medicine

## 2019-08-04 DIAGNOSIS — B354 Tinea corporis: Secondary | ICD-10-CM

## 2019-08-11 ENCOUNTER — Ambulatory Visit (INDEPENDENT_AMBULATORY_CARE_PROVIDER_SITE_OTHER): Payer: BC Managed Care – PPO | Admitting: Physician Assistant

## 2019-08-11 ENCOUNTER — Other Ambulatory Visit: Payer: Self-pay

## 2019-08-11 VITALS — BP 133/80 | HR 85 | Temp 98.2°F | Ht 64.0 in | Wt 265.0 lb

## 2019-08-11 DIAGNOSIS — E559 Vitamin D deficiency, unspecified: Secondary | ICD-10-CM

## 2019-08-11 DIAGNOSIS — Z9189 Other specified personal risk factors, not elsewhere classified: Secondary | ICD-10-CM | POA: Diagnosis not present

## 2019-08-11 DIAGNOSIS — E7849 Other hyperlipidemia: Secondary | ICD-10-CM

## 2019-08-11 DIAGNOSIS — R7303 Prediabetes: Secondary | ICD-10-CM

## 2019-08-11 DIAGNOSIS — Z6841 Body Mass Index (BMI) 40.0 and over, adult: Secondary | ICD-10-CM

## 2019-08-11 MED ORDER — VITAMIN D (ERGOCALCIFEROL) 1.25 MG (50000 UNIT) PO CAPS: 50000.0000 [IU] | ORAL_CAPSULE | ORAL | 0 refills | Status: DC

## 2019-08-12 LAB — LIPID PANEL WITH LDL/HDL RATIO
Cholesterol, Total: 252 mg/dL — ABNORMAL HIGH (ref 100–199)
HDL: 50 mg/dL (ref >39)
LDL Chol Calc (NIH): 185 mg/dL — ABNORMAL HIGH (ref 0–99)
LDL/HDL Ratio: 3.7 ratio — ABNORMAL HIGH (ref 0.0–3.2)
Triglycerides: 98 mg/dL (ref 0–149)
VLDL Cholesterol Cal: 17 mg/dL (ref 5–40)

## 2019-08-12 LAB — COMPREHENSIVE METABOLIC PANEL
ALT: 9 IU/L (ref 0–32)
AST: 12 IU/L (ref 0–40)
Albumin/Globulin Ratio: 1.8 (ref 1.2–2.2)
Albumin: 4.2 g/dL (ref 3.8–4.9)
Alkaline Phosphatase: 91 IU/L (ref 39–117)
BUN/Creatinine Ratio: 25 — ABNORMAL HIGH (ref 9–23)
BUN: 15 mg/dL (ref 6–24)
Bilirubin Total: 0.2 mg/dL (ref 0.0–1.2)
CO2: 21 mmol/L (ref 20–29)
Calcium: 9.2 mg/dL (ref 8.7–10.2)
Chloride: 101 mmol/L (ref 96–106)
Creatinine, Ser: 0.59 mg/dL (ref 0.57–1.00)
GFR calc Af Amer: 116 mL/min/{1.73_m2} (ref >59)
GFR calc non Af Amer: 101 mL/min/{1.73_m2} (ref >59)
Globulin, Total: 2.4 g/dL (ref 1.5–4.5)
Glucose: 102 mg/dL — ABNORMAL HIGH (ref 65–99)
Potassium: 4.7 mmol/L (ref 3.5–5.2)
Sodium: 140 mmol/L (ref 134–144)
Total Protein: 6.6 g/dL (ref 6.0–8.5)

## 2019-08-12 LAB — INSULIN, RANDOM: INSULIN: 14.4 u[IU]/mL (ref 2.6–24.9)

## 2019-08-12 LAB — HEMOGLOBIN A1C
Est. average glucose Bld gHb Est-mCnc: 120 mg/dL
Hgb A1c MFr Bld: 5.8 % — ABNORMAL HIGH (ref 4.8–5.6)

## 2019-08-12 LAB — VITAMIN D 25 HYDROXY (VIT D DEFICIENCY, FRACTURES): Vit D, 25-Hydroxy: 46.6 ng/mL (ref 30.0–100.0)

## 2019-08-12 NOTE — Progress Notes (Signed)
Office: [phone]  /  Fax: [phone]   HPI:   Chief Complaint: OBESITY Pamela [name] is here to discuss her progress with her obesity treatment plan. She is on the keep a food journal with 1300 to 1400 calories and 75 grams of protein daily plan and she is following her eating plan approximately 75 % of the time. She states she is exercising 0 minutes 0 times per week. Pamela [name] reports that she was able to get back on track with the plan for the last two weeks, and she decreased 2 pounds. Pamela [name] wants to get back to water aerobics. Her weight is 265 lb (120.2 kg) today and has had a weight loss of 5 pounds since her last in-office visit. She has lost 33 lbs since starting treatment with [name].  Hyperlipidemia Pamela [name] has hyperlipidemia and she is not on medications. She has been trying to improve her cholesterol levels with intensive lifestyle modification including a low saturated fat diet, exercise and weight loss. She denies any chest pain.  Pre-Diabetes Pamela [name] has a diagnosis of prediabetes based on her elevated Hgb A1c and was informed this puts her at greater risk of developing diabetes. Pamela [name] is not taking medications currently and she continues to work on diet and exercise to decrease risk of diabetes. She denies polyphagia.  At risk for diabetes Pamela [name] is at higher than average risk for developing diabetes due to her obesity and prediabetes. She currently denies polyuria or polydipsia.  ASSESSMENT AND PLAN:  Other hyperlipidemia - Plan: Lipid Panel With LDL/HDL Ratio  Prediabetes - Plan: Comprehensive metabolic panel, Hemoglobin A1c, Insulin, random  Vitamin D deficiency - Plan: Vitamin D, Ergocalciferol, ([name]) 1.25 MG (50000 UT) CAPS capsule, VITAMIN D 25 Hydroxy (Vit-D Deficiency, Fractures)  At risk for diabetes mellitus  Class 3 severe obesity with serious comorbidity and body mass index (BMI) of 45.0 to 49.9 in adult, unspecified obesity type  ([place])  PLAN:  Hyperlipidemia Pamela [name] was informed of the American Heart Association Guidelines emphasizing intensive lifestyle modifications as the first line treatment for hyperlipidemia. We discussed many lifestyle modifications today in depth, and Pamela [name] will continue to work on decreasing saturated fats such as fatty red meat, butter and many fried foods. She will also increase vegetables and lean protein in her diet and continue to work on exercise and weight loss efforts. We will check labs and Jakyra will follow up as directed.  Pre-Diabetes Pamela [name] will continue to work on weight loss, exercise, and decreasing simple carbohydrates in her diet to help decrease the risk of diabetes. She was informed that eating too many simple carbohydrates or too many calories at one sitting increases the likelihood of GI side effects. We will check labs and Pamela [name] agreed to follow up with [name] as directed to monitor her progress.  Diabetes risk counseling Pamela [name] was given extended (15 minutes) diabetes prevention counseling today. She is 58 y.o. female and has risk factors for diabetes including obesity and prediabetes. We discussed intensive lifestyle modifications today with an emphasis on weight loss as well as increasing exercise and decreasing simple carbohydrates in her diet.  Obesity Pamela [name] is currently in the action stage of change. As such, her goal is to continue with weight loss efforts She has agreed to keep a food journal with 1300 to 1400 calories and 75 grams of protein daily Pamela [name] has been instructed to work up to a goal of 150 minutes of combined cardio and strengthening exercise per week for weight loss and overall health  benefits. We discussed the following Behavioral Modification Strategies today: keeping healthy foods in the home and work on meal planning and easy cooking plans  Pamela [name] has agreed to follow up with our clinic in 4 weeks. She was informed of the importance of  frequent follow up visits to maximize her success with intensive lifestyle modifications for her multiple health conditions.  ALLERGIES: No Known Allergies  MEDICATIONS: Current Outpatient Medications on File Prior to Visit  Medication Sig Dispense Refill   albuterol (PROVENTIL HFA;VENTOLIN HFA) 108 (90 Base) MCG/ACT inhaler Inhale 1-2 puffs into the lungs every 6 (six) hours as needed for wheezing or shortness of breath. 1 Inhaler 3   citalopram (CELEXA) 20 MG tablet Take 20 mg by mouth daily.     clobetasol (TEMOVATE) 0.05 % external solution Apply 1 application topically 2 (two) times daily as needed (irritation).      INCRUSE ELLIPTA 62.5 MCG/INH AEPB INHALE 1 PUFF INTO THE LUNGS DAILY (Patient taking differently: Inhale 1 puff into the lungs daily. ) 30 each 5   ketoconazole (NIZORAL) 2 % cream APPLY TO AREA ON LEFT ARM AND LEFT LEG DAILY FOR 14 DAYS 30 g 0   levothyroxine (SYNTHROID, LEVOTHROID) 150 MCG tablet Take 1 tablet (150 mcg total) by mouth daily before breakfast. 30 tablet 3   Melatonin 5 MG TABS Take 5 mg by mouth at bedtime.     modafinil (PROVIGIL) 200 MG tablet Take 200 mg by mouth daily.     omeprazole (PRILOSEC) 40 MG capsule Take 40 mg by mouth daily as needed (heartburn).      oxyCODONE (OXY IR/ROXICODONE) 5 MG immediate release tablet Take 1 tablet (5 mg total) by mouth every 6 (six) hours as needed for moderate pain, severe pain or breakthrough pain. 25 tablet 0   WIXELA INHUB 500-50 MCG/DOSE AEPB INHALE 1 PUFF INTO THE LUNGS TWICE DAILY (Patient taking differently: Inhale 1 puff into the lungs 2 (two) times daily. ) 60 each 0   No current facility-administered medications on file prior to visit.     PAST MEDICAL HISTORY: Past Medical History:  Diagnosis Date   Anemia    Asthma    Blood in stool    Colon polyps    COPD (chronic obstructive pulmonary disease) (HCC)    Depression    Depression    GERD (gastroesophageal reflux disease)     Heart murmur    childhood   History of alcohol abuse    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)   History of blood transfusion 2006   HLD (hyperlipidemia)    Obesity    Sleep apnea    Thyroid disease     PAST SURGICAL HISTORY: Past Surgical History:  Procedure Laterality Date   AUGMENTATION MAMMAPLASTY     BREAST BIOPSY     INSERTION OF MESH N/A 07/16/2019   Procedure: Insertion Of Mesh;  Surgeon: Abigail Miyamoto, MD;  Location: [facility] OR;  Service: General;  Laterality: N/A;   LAPAROSCOPIC GASTRIC BANDING  2010   VENTRAL HERNIA REPAIR N/A 07/16/2019   Procedure: VENTRAL INCISIONAL HERNIA REPAIR WITH MESH;  Surgeon: Abigail Miyamoto, MD;  Location: [facility] OR;  Service: General;  Laterality: N/A;    SOCIAL HISTORY: Social History   Tobacco Use   Smoking status: Current Some Day Smoker    Packs/day: 0.25    Types: Cigarettes   Smokeless tobacco: Never Used  Substance Use Topics   Alcohol use: No    Frequency: Never  Comment: 13 years sober   Drug use: No    FAMILY HISTORY: Family History  Problem Relation Age of Onset   Alcohol abuse Mother    Depression Mother    Early death Mother    Liver disease Mother    Alcohol abuse Father    Obesity Father    Cancer Father     ROS: Review of Systems  Constitutional: Positive for weight loss.  Cardiovascular: Negative for chest pain.  Genitourinary: Negative for frequency.  Endo/Heme/Allergies: Negative for polydipsia.       Negative for polyphagia    PHYSICAL EXAM: Blood pressure 133/80, pulse 85, temperature 98.2 F (36.8 C), temperature source Oral, height 5\' 4"  (1.626 m), weight 265 lb (120.2 kg), SpO2 93 %. Body mass index is 45.49 kg/m. Physical Exam Vitals signs reviewed.  Constitutional:      Appearance: Normal appearance. She is well-developed. She is obese.  Cardiovascular:     Rate and Rhythm: Normal rate.  Pulmonary:     Effort: Pulmonary effort is normal.  Musculoskeletal: Normal  range of motion.  Skin:    General: Skin is warm and dry.  Neurological:     Mental Status: She is alert and oriented to person, place, and time.  Psychiatric:        Mood and Affect: Mood normal.        Behavior: Behavior normal.     RECENT LABS AND TESTS: BMET    Component Value Date/Time   NA 140 08/11/2019 0955   K 4.7 08/11/2019 0955   CL 101 08/11/2019 0955   CO2 21 08/11/2019 0955   GLUCOSE 102 (H) 08/11/2019 0955   GLUCOSE 122 (H) 07/13/2019 0853   BUN 15 08/11/2019 0955   CREATININE 0.59 08/11/2019 0955   CALCIUM 9.2 08/11/2019 0955   GFRNONAA 101 08/11/2019 0955   GFRAA 116 08/11/2019 0955   Lab Results  Component Value Date   HGBA1C 5.8 (H) 08/11/2019   HGBA1C 5.9 (H) 02/02/2019   HGBA1C 5.9 (H) 09/25/2018   HGBA1C 6.0 (H) 06/23/2018   Lab Results  Component Value Date   INSULIN 14.4 08/11/2019   INSULIN 13.7 02/02/2019   INSULIN 13.5 09/25/2018   INSULIN 10.9 06/05/2018   CBC    Component Value Date/Time   WBC 7.1 07/13/2019 0853   RBC 5.33 (H) 07/13/2019 0853   HGB 15.7 (H) 07/13/2019 0853   HGB 15.2 06/05/2018 0937   HCT 47.8 (H) 07/13/2019 0853   HCT 45.3 06/05/2018 0937   PLT 359 07/13/2019 0853   MCV 89.7 07/13/2019 0853   MCV 87 06/05/2018 0937   MCH 29.5 07/13/2019 0853   MCHC 32.8 07/13/2019 0853   RDW 14.3 07/13/2019 0853   RDW 15.2 06/05/2018 0937   LYMPHSABS 2.1 06/05/2018 0937   EOSABS 0.1 06/05/2018 0937   BASOSABS 0.1 06/05/2018 0937   Iron/TIBC/Ferritin/ %Sat No results found for: IRON, TIBC, FERRITIN, IRONPCTSAT Lipid Panel     Component Value Date/Time   CHOL 252 (H) 08/11/2019 0955   TRIG 98 08/11/2019 0955   HDL 50 08/11/2019 0955   CHOLHDL 5 05/23/2018 0853   VLDL 16.6 05/23/2018 0853   LDLCALC 174 (H) 02/02/2019 1527   Hepatic Function Panel     Component Value Date/Time   PROT 6.6 08/11/2019 0955   ALBUMIN 4.2 08/11/2019 0955   AST 12 08/11/2019 0955   ALT 9 08/11/2019 0955   ALKPHOS 91 08/11/2019 0955    BILITOT 0.2 08/11/2019 0955  Component Value Date/Time   TSH 4.450 06/05/2018 0937   TSH 3.47 01/27/2018 1143     Ref. Range 02/02/2019 15:27  Vitamin D, 25-Hydroxy Latest Ref Range: 30.0 - 100.0 ng/mL 46.2    OBESITY BEHAVIORAL INTERVENTION VISIT  Today's visit was # 19   Starting weight: 298 lbs Starting date: 06/05/2018 Today's weight : 265 lbs  Today's date: 08/11/2019 Total lbs lost to date: 33    08/11/2019  Height 5\' 4"  (1.626 m)  Weight 265 lb (120.2 kg)  BMI (Calculated) 45.46  BLOOD PRESSURE - SYSTOLIC 133  BLOOD PRESSURE - DIASTOLIC 80   Body Fat % 54.4 %  Total Body Water (lbs) 82.8 lbs    ASK: We discussed the diagnosis of obesity with Pamela [name] today and Pamela [name] agreed to give us permission to discuss obesity behavioral modification therapy today.  ASSESS: Pamela [name] has the diagnosis of obesity and her BMI today is 45.46 Pamela [name] is in the action stage of change   ADVISE: Pamela [name] was educated on the multiple health risks of obesity as well as the benefit of weight loss to improve her health. She was advised of the need for long term treatment and the importance of lifestyle modifications to improve her current health and to decrease her risk of future health problems.  AGREE: Multiple dietary modification options and treatment options were discussed and  Pamela [name] agreed to follow the recommendations documented in the above note.  ARRANGE: Pamela [name] was educated on the importance of frequent visits to treat obesity as outlined per CMS and USPSTF guidelines and agreed to schedule her next follow up appointment today.  Cristi LoronI, Joanne Murray, am acting as transcriptionist for Alois Clicheracey Aguilar, PA-C I, Alois Clicheracey Aguilar, PA-C have reviewed above note and agree with its content

## 2019-08-13 ENCOUNTER — Encounter: Payer: Self-pay | Admitting: Family Medicine

## 2019-08-13 MED ORDER — OMEPRAZOLE 40 MG PO CPDR: 40.0000 mg | DELAYED_RELEASE_CAPSULE | Freq: Every day | ORAL | 1 refills | Status: DC | PRN

## 2019-09-07 ENCOUNTER — Ambulatory Visit (INDEPENDENT_AMBULATORY_CARE_PROVIDER_SITE_OTHER): Payer: BC Managed Care – PPO | Admitting: Physician Assistant

## 2019-09-07 ENCOUNTER — Other Ambulatory Visit: Payer: Self-pay | Admitting: Family Medicine

## 2019-09-07 DIAGNOSIS — J449 Chronic obstructive pulmonary disease, unspecified: Secondary | ICD-10-CM

## 2019-09-07 MED ORDER — INCRUSE ELLIPTA 62.5 MCG/INH IN AEPB: 1.0000 | INHALATION_SPRAY | Freq: Every day | RESPIRATORY_TRACT | 3 refills | Status: DC

## 2019-09-15 ENCOUNTER — Encounter (INDEPENDENT_AMBULATORY_CARE_PROVIDER_SITE_OTHER): Payer: Self-pay | Admitting: Physician Assistant

## 2019-09-15 ENCOUNTER — Ambulatory Visit (INDEPENDENT_AMBULATORY_CARE_PROVIDER_SITE_OTHER): Payer: BC Managed Care – PPO | Admitting: Physician Assistant

## 2019-09-15 ENCOUNTER — Other Ambulatory Visit: Payer: Self-pay

## 2019-09-15 VITALS — BP 136/86 | HR 94 | Temp 98.0°F | Ht 64.0 in | Wt 269.0 lb

## 2019-09-15 DIAGNOSIS — Z9189 Other specified personal risk factors, not elsewhere classified: Secondary | ICD-10-CM

## 2019-09-15 DIAGNOSIS — E7849 Other hyperlipidemia: Secondary | ICD-10-CM | POA: Diagnosis not present

## 2019-09-15 DIAGNOSIS — E559 Vitamin D deficiency, unspecified: Secondary | ICD-10-CM | POA: Diagnosis not present

## 2019-09-15 DIAGNOSIS — Z6841 Body Mass Index (BMI) 40.0 and over, adult: Secondary | ICD-10-CM

## 2019-09-15 MED ORDER — ATORVASTATIN CALCIUM 20 MG PO TABS: 20.0000 mg | ORAL_TABLET | Freq: Every day | ORAL | 0 refills | Status: DC

## 2019-09-15 MED ORDER — VITAMIN D (ERGOCALCIFEROL) 1.25 MG (50000 UNIT) PO CAPS: 50000.0000 [IU] | ORAL_CAPSULE | ORAL | 0 refills | Status: DC

## 2019-09-16 NOTE — Progress Notes (Signed)
Office: [phone]  /  Fax: [phone]   HPI:   Chief Complaint: OBESITY Pamela [name] is here to discuss her progress with her obesity treatment plan. She is on the Category 1 plan and is following her eating plan approximately 100% of the time. She states she is doing water aerobics 60 minutes 2 times per week. Pamela [name] reports that she is not always getting her protein in daily. She continues to struggle with intermittent sweet cravings. Her weight is 269 lb (122 kg) today and has had a weight gain of 4 lbs since her last visit. She has lost 29 lbs since starting treatment with us.  Vitamin D deficiency Pamela [name] has a diagnosis of Vitamin D deficiency. She is currently taking prescription Vit D and denies nausea, vomiting or muscle weakness.  Hyperlipidemia Pamela [name] has hyperlipidemia and is on no medications. She has been trying to improve her cholesterol levels with intensive lifestyle modification including a low saturated fat diet, exercise and weight loss. Her last level was not at goal. She denies any chest pain, claudication or myalgias.  At risk for cardiovascular disease Pamela [name] is at a higher than average risk for cardiovascular disease due to obesity. She currently denies any chest pain.  ASSESSMENT AND PLAN:  Vitamin D deficiency - Plan: Vitamin D, Ergocalciferol, ([name]) 1.25 MG (50000 UT) CAPS capsule  Other hyperlipidemia - Plan: atorvastatin (LIPITOR) 20 MG tablet  At risk for heart disease  Class 3 severe obesity with serious comorbidity and body mass index (BMI) of 45.0 to 49.9 in adult, unspecified obesity type (HCC)  PLAN:  Vitamin D Deficiency Pamela [name] was informed that low Vitamin D levels contributes to fatigue and are associated with obesity, breast, and colon cancer. She agrees to take prescription Vit D @ 50,000 IU every 3 days #10 with 0 refills (she will discontinue once weekly dose) and will follow-up for routine testing of Vitamin D, at least 2-3 times  per year. She was informed of the risk of over-replacement of Vitamin D and agrees to not increase her dose unless she discusses this with us first. Pamela [name] agrees to follow-up with our clinic in 4 weeks.  Hyperlipidemia Pamela [name] was informed of the American Heart Association Guidelines emphasizing intensive lifestyle modifications as the first line treatment for hyperlipidemia. We discussed many lifestyle modifications today in depth, and Pamela [name] will continue to work on decreasing saturated fats such as fatty red meat, butter and many fried foods. Pamela [name] will start atorvastatin 20 mg 1 PO QHS #30 with 0 refills and agrees to follow-up with our clinic in 4 weeks. She will also increase vegetables and lean protein in her diet and continue to work on exercise and weight loss efforts.  Cardiovascular risk counseling Pamela [name] was given extended (15 minutes) coronary artery disease prevention counseling today. She is 58 y.o. female and has risk factors for heart disease including obesity. We discussed intensive lifestyle modifications today with an emphasis on specific weight loss instructions and strategies. Pt was also informed of the importance of increasing exercise and decreasing saturated fats to help prevent heart disease.  Obesity Pamela [name] is currently in the action stage of change. As such, her goal is to continue with weight loss efforts. She has agreed to keep a food journal with 1200-1400 calories and 90 grams of protein daily. Pamela [name] will have IC testing at her next visit. Pamela [name] has been instructed to work up to a goal of 150 minutes of combined cardio and strengthening exercise per week for weight loss  and overall health benefits. We discussed the following Behavioral Modification Strategies today: work on meal planning and easy cooking plans, and keeping healthy foods in the home.  Pamela [name] has agreed to follow-up with our clinic in 4 weeks. She was informed of the importance of frequent  follow-up visits to maximize her success with intensive lifestyle modifications for her multiple health conditions.  ALLERGIES: No Known Allergies  MEDICATIONS: Current Outpatient Medications on File Prior to Visit  Medication Sig Dispense Refill   albuterol (PROVENTIL HFA;VENTOLIN HFA) 108 (90 Base) MCG/ACT inhaler Inhale 1-2 puffs into the lungs every 6 (six) hours as needed for wheezing or shortness of breath. 1 Inhaler 3   citalopram (CELEXA) 20 MG tablet Take 20 mg by mouth daily.     clobetasol (TEMOVATE) 0.05 % external solution Apply 1 application topically 2 (two) times daily as needed (irritation).      ketoconazole (NIZORAL) 2 % cream APPLY TO AREA ON LEFT ARM AND LEFT LEG DAILY FOR 14 DAYS 30 g 0   levothyroxine (SYNTHROID, LEVOTHROID) 150 MCG tablet Take 1 tablet (150 mcg total) by mouth daily before breakfast. 30 tablet 3   Melatonin 5 MG TABS Take 5 mg by mouth at bedtime.     modafinil (PROVIGIL) 200 MG tablet Take 200 mg by mouth daily.     omeprazole (PRILOSEC) 40 MG capsule Take 1 capsule (40 mg total) by mouth daily as needed (heartburn). 90 capsule 1   oxyCODONE (OXY IR/ROXICODONE) 5 MG immediate release tablet Take 1 tablet (5 mg total) by mouth every 6 (six) hours as needed for moderate pain, severe pain or breakthrough pain. 25 tablet 0   umeclidinium bromide (INCRUSE ELLIPTA) 62.5 MCG/INH AEPB Inhale 1 puff into the lungs daily. 30 each 3   WIXELA INHUB 500-50 MCG/DOSE AEPB INHALE 1 PUFF INTO THE LUNGS TWICE DAILY (Patient taking differently: Inhale 1 puff into the lungs 2 (two) times daily. ) 60 each 0   No current facility-administered medications on file prior to visit.     PAST MEDICAL HISTORY: Past Medical History:  Diagnosis Date   Anemia    Asthma    Blood in stool    Colon polyps    COPD (chronic obstructive pulmonary disease) (HCC)    Depression    Depression    GERD (gastroesophageal reflux disease)    Heart murmur    childhood    History of alcohol abuse    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)   History of blood transfusion 2006   HLD (hyperlipidemia)    Obesity    Sleep apnea    Thyroid disease     PAST SURGICAL HISTORY: Past Surgical History:  Procedure Laterality Date   AUGMENTATION MAMMAPLASTY     BREAST BIOPSY     INSERTION OF MESH N/A 07/16/2019   Procedure: Insertion Of Mesh;  Surgeon: Abigail Miyamoto, MD;  Location: [facility] OR;  Service: General;  Laterality: N/A;   LAPAROSCOPIC GASTRIC BANDING  2010   VENTRAL HERNIA REPAIR N/A 07/16/2019   Procedure: VENTRAL INCISIONAL HERNIA REPAIR WITH MESH;  Surgeon: Abigail Miyamoto, MD;  Location: [facility] OR;  Service: General;  Laterality: N/A;    SOCIAL HISTORY: Social History   Tobacco Use   Smoking status: Current Some Day Smoker    Packs/day: 0.25    Types: Cigarettes   Smokeless tobacco: Never Used  Substance Use Topics   Alcohol use: No    Frequency: Never    Comment: 13 years sober  Drug use: No    FAMILY HISTORY: Family History  Problem Relation Age of Onset   Alcohol abuse Mother    Depression Mother    Early death Mother    Liver disease Mother    Alcohol abuse Father    Obesity Father    Cancer Father    ROS: Review of Systems  Cardiovascular: Negative for chest pain and claudication.  Gastrointestinal: Negative for nausea and vomiting.  Musculoskeletal: Negative for myalgias.       Negative for muscle weakness.   PHYSICAL EXAM: Blood pressure 136/86, pulse 94, temperature 98 F (36.7 C), temperature source Oral, height 5\' 4"  (1.626 m), weight 269 lb (122 kg), SpO2 95 %. Body mass index is 46.17 kg/m. Physical Exam Vitals signs reviewed.  Constitutional:      Appearance: Normal appearance. She is obese.  Cardiovascular:     Rate and Rhythm: Normal rate.     Pulses: Normal pulses.  Pulmonary:     Effort: Pulmonary effort is normal.     Breath sounds: Normal breath sounds.  Musculoskeletal: Normal range  of motion.  Skin:    General: Skin is warm and dry.  Neurological:     Mental Status: She is alert and oriented to person, place, and time.  Psychiatric:        Behavior: Behavior normal.   RECENT LABS AND TESTS: BMET    Component Value Date/Time   NA 140 08/11/2019 0955   K 4.7 08/11/2019 0955   CL 101 08/11/2019 0955   CO2 21 08/11/2019 0955   GLUCOSE 102 (H) 08/11/2019 0955   GLUCOSE 122 (H) 07/13/2019 0853   BUN 15 08/11/2019 0955   CREATININE 0.59 08/11/2019 0955   CALCIUM 9.2 08/11/2019 0955   GFRNONAA 101 08/11/2019 0955   GFRAA 116 08/11/2019 0955   Lab Results  Component Value Date   HGBA1C 5.8 (H) 08/11/2019   HGBA1C 5.9 (H) 02/02/2019   HGBA1C 5.9 (H) 09/25/2018   HGBA1C 6.0 (H) 06/23/2018   Lab Results  Component Value Date   INSULIN 14.4 08/11/2019   INSULIN 13.7 02/02/2019   INSULIN 13.5 09/25/2018   INSULIN 10.9 06/05/2018   CBC    Component Value Date/Time   WBC 7.1 07/13/2019 0853   RBC 5.33 (H) 07/13/2019 0853   HGB 15.7 (H) 07/13/2019 0853   HGB 15.2 06/05/2018 0937   HCT 47.8 (H) 07/13/2019 0853   HCT 45.3 06/05/2018 0937   PLT 359 07/13/2019 0853   MCV 89.7 07/13/2019 0853   MCV 87 06/05/2018 0937   MCH 29.5 07/13/2019 0853   MCHC 32.8 07/13/2019 0853   RDW 14.3 07/13/2019 0853   RDW 15.2 06/05/2018 0937   LYMPHSABS 2.1 06/05/2018 0937   EOSABS 0.1 06/05/2018 0937   BASOSABS 0.1 06/05/2018 0937   Iron/TIBC/Ferritin/ %Sat No results found for: IRON, TIBC, FERRITIN, IRONPCTSAT Lipid Panel     Component Value Date/Time   CHOL 252 (H) 08/11/2019 0955   TRIG 98 08/11/2019 0955   HDL 50 08/11/2019 0955   CHOLHDL 5 05/23/2018 0853   VLDL 16.6 05/23/2018 0853   LDLCALC 185 (H) 08/11/2019 0955   Hepatic Function Panel     Component Value Date/Time   PROT 6.6 08/11/2019 0955   ALBUMIN 4.2 08/11/2019 0955   AST 12 08/11/2019 0955   ALT 9 08/11/2019 0955   ALKPHOS 91 08/11/2019 0955   BILITOT 0.2 08/11/2019 0955      Component  Value Date/Time   TSH 4.450  06/05/2018 0937   TSH 3.47 01/27/2018 1143   Results for MALIEA, GRANDMAISON ANN (MRN [id number]) as of 09/16/2019 11:38  Ref. Range 08/11/2019 09:55  Vitamin D, 25-Hydroxy Latest Ref Range: 30.0 - 100.0 ng/mL 46.6   OBESITY BEHAVIORAL INTERVENTION VISIT  Today's visit was #20  Starting weight: 298 lbs Starting date: 06/05/2018 Today's weight: 269 lbs  Today's date: 09/15/2019 Total lbs lost to date: 29    09/15/2019  Height 5\' 4"  (1.626 m)  Weight 269 lb (122 kg)  BMI (Calculated) 46.15  BLOOD PRESSURE - SYSTOLIC 136  BLOOD PRESSURE - DIASTOLIC 86   Body Fat % 53.2 %  Total Body Water (lbs) 83.8 lbs   ASK: We discussed the diagnosis of obesity with today and Denika agreed to give Pamela Cella permission to discuss obesity behavioral modification therapy today.  ASSESS: Nakeshia has the diagnosis of obesity and her BMI today is 46.2. Shelbey is in the action stage of change.   ADVISE: Kimberley was educated on the multiple health risks of obesity as well as the benefit of weight loss to improve her health. She was advised of the need for long term treatment and the importance of lifestyle modifications to improve her current health and to decrease her risk of future health problems.  AGREE: Multiple dietary modification options and treatment options were discussed and  Teniola agreed to follow the recommendations documented in the above note.  ARRANGE: Natasja was educated on the importance of frequent visits to treat obesity as outlined per CMS and USPSTF guidelines and agreed to schedule her next follow up appointment today.  Pamela Cella, am acting as transcriptionist for Fernanda Drum, PA-C I, Alois Cliche, PA-C have reviewed above note and agree with its content

## 2019-10-15 ENCOUNTER — Encounter (INDEPENDENT_AMBULATORY_CARE_PROVIDER_SITE_OTHER): Payer: Self-pay | Admitting: Physician Assistant

## 2019-10-15 ENCOUNTER — Other Ambulatory Visit: Payer: Self-pay

## 2019-10-15 ENCOUNTER — Ambulatory Visit (INDEPENDENT_AMBULATORY_CARE_PROVIDER_SITE_OTHER): Payer: BC Managed Care – PPO | Admitting: Physician Assistant

## 2019-10-15 VITALS — BP 128/83 | HR 80 | Temp 98.1°F | Ht 64.0 in | Wt 268.0 lb

## 2019-10-15 DIAGNOSIS — E7849 Other hyperlipidemia: Secondary | ICD-10-CM

## 2019-10-15 DIAGNOSIS — R0602 Shortness of breath: Secondary | ICD-10-CM

## 2019-10-15 DIAGNOSIS — Z9189 Other specified personal risk factors, not elsewhere classified: Secondary | ICD-10-CM

## 2019-10-15 DIAGNOSIS — Z6841 Body Mass Index (BMI) 40.0 and over, adult: Secondary | ICD-10-CM

## 2019-10-15 DIAGNOSIS — E559 Vitamin D deficiency, unspecified: Secondary | ICD-10-CM

## 2019-10-15 MED ORDER — VITAMIN D (ERGOCALCIFEROL) 1.25 MG (50000 UNIT) PO CAPS: 50000.0000 [IU] | ORAL_CAPSULE | ORAL | 0 refills | Status: DC

## 2019-10-15 MED ORDER — ATORVASTATIN CALCIUM 20 MG PO TABS: 20.0000 mg | ORAL_TABLET | Freq: Every day | ORAL | 0 refills | Status: DC

## 2019-10-19 ENCOUNTER — Encounter: Payer: Self-pay | Admitting: Family Medicine

## 2019-10-19 ENCOUNTER — Ambulatory Visit (INDEPENDENT_AMBULATORY_CARE_PROVIDER_SITE_OTHER): Payer: BC Managed Care – PPO | Admitting: Physician Assistant

## 2019-10-19 NOTE — Progress Notes (Signed)
Office: [phone]  /  Fax: [phone]   HPI:   Chief Complaint: OBESITY Cay is here to discuss her progress with her obesity treatment plan. She was advised to follow the keep a food journal with 1200 to 1400 calories and 90 grams of protein daily plan. She states she doesn't remember which plan she is following. She states she is following her eating plan approximately 75 % of the time. She states she is doing water aerobics 60 minutes 2 times per week. Sierria is eating around 1200 calories daily, but she is only eating between 50 and 60 grams of protein. She reports being "tired of food". Her weight is 268 lb (121.6 kg) today and has had a weight loss of 1 pound over a period of 4 weeks since her last visit. She has lost 30 lbs since starting treatment with [name].  Vitamin D deficiency Analina has a diagnosis of vitamin D deficiency. Tian is on vit D and she denies nausea, vomiting or muscle weakness.  At risk for osteopenia and osteoporosis Nehemie is at higher risk of osteopenia and osteoporosis due to vitamin D deficiency.   Shortness of Breath Mehr notes shortness of breath on exertion. She has no dizziness or lightheadedness. Indirect calorimeter will be checked today.  Hyperlipidemia Deira has hyperlipidemia and she is on Lipitor. She denies any chest pain. Marvella has been trying to improve her cholesterol levels with intensive lifestyle modification including a low saturated fat diet, exercise and weight loss. Danna has no muscle aches.  ASSESSMENT AND PLAN:  Vitamin D deficiency - Plan: Vitamin D, Ergocalciferol, ([name]) 1.25 MG (50000 UT) CAPS capsule  Shortness of breath on exertion  Other hyperlipidemia - Plan: atorvastatin (LIPITOR) 20 MG tablet  At risk for osteoporosis  Class 3 severe obesity with serious comorbidity and body mass index (BMI) of 45.0 to 49.9 in adult, unspecified obesity type (HCC)  PLAN:  Vitamin D Deficiency Inaaya was  informed that low vitamin D levels contributes to fatigue and are associated with obesity, breast, and colon cancer. Shariya agrees to continue to take prescription Vit D @50 ,000 IU every 3 days #10 with no refills and she will follow up for routine testing of vitamin D, at least 2-3 times per year. She was informed of the risk of over-replacement of vitamin D and agrees to not increase her dose unless she discusses this with first. Aundria agrees to follow up with our clinic in 4 weeks.  At risk for osteopenia and osteoporosis Lalonnie was given extended  (15 minutes) osteoporosis prevention counseling today. Laela is at risk for osteopenia and osteoporosis due to her vitamin D deficiency. She was encouraged to take her vitamin D and follow her higher calcium diet and increase strengthening exercise to help strengthen her bones and decrease her risk of osteopenia and osteoporosis.  Shortness of Breath Jericha's shortness of breath appears to be obesity related and exercise induced. The indirect calorimeter results showed VO2 of 280 and a REE of 1953. She will continue to work on weight loss and gradually increase exercise to treat her exercise induced shortness of breath.   Hyperlipidemia Delania was informed of the American Heart Association Guidelines emphasizing intensive lifestyle modifications as the first line treatment for hyperlipidemia. We discussed many lifestyle modifications today in depth, and Thanvi will continue to work on decreasing saturated fats such as fatty red meat, butter and many fried foods. She will also increase vegetables and lean protein in her diet and continue  to work on exercise and weight loss efforts. Angelize agrees to continue lipitor 20 mg once daily #30 with no refills and follow up as directed.  Obesity Laronda is currently in the action stage of change. As such, her goal is to continue with weight loss efforts She has agreed to keep a food journal with 1350  calories and 85 grams of protein daily Damonie has been instructed to work up to a goal of 150 minutes of combined cardio and strengthening exercise per week for weight loss and overall health benefits. We discussed the following Behavioral Modification Strategies today: keeping healthy foods in the home and work on meal planning and easy cooking plans  Haniyyah has agreed to follow up with our clinic in 4 weeks. She was informed of the importance of frequent follow up visits to maximize her success with intensive lifestyle modifications for her multiple health conditions.  ALLERGIES: No Known Allergies  MEDICATIONS: Current Outpatient Medications on File Prior to Visit  Medication Sig Dispense Refill   albuterol (PROVENTIL HFA;VENTOLIN HFA) 108 (90 Base) MCG/ACT inhaler Inhale 1-2 puffs into the lungs every 6 (six) hours as needed for wheezing or shortness of breath. 1 Inhaler 3   citalopram (CELEXA) 20 MG tablet Take 20 mg by mouth daily.     clobetasol (TEMOVATE) 0.05 % external solution Apply 1 application topically 2 (two) times daily as needed (irritation).      ketoconazole (NIZORAL) 2 % cream APPLY TO AREA ON LEFT ARM AND LEFT LEG DAILY FOR 14 DAYS 30 g 0   levothyroxine (SYNTHROID, LEVOTHROID) 150 MCG tablet Take 1 tablet (150 mcg total) by mouth daily before breakfast. 30 tablet 3   Melatonin 5 MG TABS Take 5 mg by mouth at bedtime.     modafinil (PROVIGIL) 200 MG tablet Take 200 mg by mouth daily.     omeprazole (PRILOSEC) 40 MG capsule Take 1 capsule (40 mg total) by mouth daily as needed (heartburn). 90 capsule 1   oxyCODONE (OXY IR/ROXICODONE) 5 MG immediate release tablet Take 1 tablet (5 mg total) by mouth every 6 (six) hours as needed for moderate pain, severe pain or breakthrough pain. 25 tablet 0   umeclidinium bromide (INCRUSE ELLIPTA) 62.5 MCG/INH AEPB Inhale 1 puff into the lungs daily. 30 each 3   WIXELA INHUB 500-50 MCG/DOSE AEPB INHALE 1 PUFF INTO THE LUNGS  TWICE DAILY (Patient taking differently: Inhale 1 puff into the lungs 2 (two) times daily. ) 60 each 0   No current facility-administered medications on file prior to visit.     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)   History of blood transfusion 2006   HLD (hyperlipidemia)    Obesity    Sleep apnea    Thyroid disease     PAST SURGICAL HISTORY: Past Surgical History:  Procedure Laterality Date   AUGMENTATION MAMMAPLASTY     BREAST BIOPSY     INSERTION OF MESH N/A 07/16/2019   Procedure: Insertion Of Mesh;  Surgeon: Abigail Miyamoto, MD;  Location: [facility] OR;  Service: General;  Laterality: N/A;   LAPAROSCOPIC  GASTRIC BANDING  2010   VENTRAL HERNIA REPAIR N/A 07/16/2019   Procedure: VENTRAL INCISIONAL HERNIA REPAIR WITH MESH;  Surgeon: Coralie Keens, MD;  Location: [facility];  Service: General;  Laterality: N/A;    SOCIAL HISTORY: Social History   Tobacco Use   Smoking status: Current Some Day Smoker    Packs/day: 0.25    Types: Cigarettes   Smokeless tobacco: Never Used  Substance Use Topics   Alcohol use: No    Frequency: Never    Comment: 13 years sober   Drug use: No    FAMILY HISTORY: Family History  Problem Relation Age of Onset   Alcohol abuse Mother    Depression Mother    Early death Mother    Liver disease Mother    Alcohol abuse Father    Obesity Father    Cancer Father     ROS: Review of Systems  Constitutional: Positive for weight loss.  Respiratory: Positive for shortness of breath (on exertion).   Cardiovascular: Negative for chest pain.  Gastrointestinal: Negative for nausea and vomiting.  Musculoskeletal: Negative for  myalgias.       Negative for muscle weakness  Neurological: Negative for dizziness.       Negative for lightheadedness    PHYSICAL EXAM: Blood pressure 128/83, pulse 80, temperature 98.1 F (36.7 C), temperature source Oral, height 5\' 4"  (1.626 m), weight 268 lb (121.6 kg), SpO2 92 %. Body mass index is 46 kg/m. Physical Exam Vitals signs reviewed.  Constitutional:      Appearance: Normal appearance. She is well-developed. She is obese.  Cardiovascular:     Rate and Rhythm: Normal rate.  Pulmonary:     Effort: Pulmonary effort is normal.  Musculoskeletal: Normal range of motion.  Skin:    General: Skin is warm and dry.  Neurological:     Mental Status: She is alert and oriented to person, place, and time.  Psychiatric:        Mood and Affect: Mood normal.        Behavior: Behavior normal.     RECENT LABS AND TESTS: BMET    Component Value Date/Time   NA 140 08/11/2019 0955   K 4.7 08/11/2019 0955   CL 101 08/11/2019 0955   CO2 21 08/11/2019 0955   GLUCOSE 102 (H) 08/11/2019 0955   GLUCOSE 122 (H) 07/13/2019 0853   BUN 15 08/11/2019 0955   CREATININE 0.59 08/11/2019 0955   CALCIUM 9.2 08/11/2019 0955   GFRNONAA 101 08/11/2019 0955   GFRAA 116 08/11/2019 0955   Lab Results  Component Value Date   HGBA1C 5.8 (H) 08/11/2019   HGBA1C 5.9 (H) 02/02/2019   HGBA1C 5.9 (H) 09/25/2018   HGBA1C 6.0 (H) 06/23/2018   Lab Results  Component Value Date   INSULIN 14.4 08/11/2019   INSULIN 13.7 02/02/2019   INSULIN 13.5 09/25/2018   INSULIN 10.9 06/05/2018   CBC    Component Value Date/Time   WBC 7.1 07/13/2019 0853   RBC 5.33 (H) 07/13/2019 0853   HGB 15.7 (H) 07/13/2019 0853   HGB 15.2 06/05/2018 0937   HCT 47.8 (H) 07/13/2019 0853   HCT 45.3 06/05/2018 0937   PLT 359 07/13/2019 0853   MCV 89.7 07/13/2019 0853   MCV 87 06/05/2018 0937   MCH 29.5 07/13/2019 0853   MCHC 32.8 07/13/2019 0853   RDW 14.3 07/13/2019 0853   RDW 15.2 06/05/2018 0937   LYMPHSABS  2.1 06/05/2018 0937   EOSABS 0.1 06/05/2018 0623  BASOSABS 0.1 06/05/2018 0937   Iron/TIBC/Ferritin/ %Sat No results found for: IRON, TIBC, FERRITIN, IRONPCTSAT Lipid Panel     Component Value Date/Time   CHOL 252 (H) 08/11/2019 0955   TRIG 98 08/11/2019 0955   HDL 50 08/11/2019 0955   CHOLHDL 5 05/23/2018 0853   VLDL 16.6 05/23/2018 0853   LDLCALC 185 (H) 08/11/2019 0955   Hepatic Function Panel     Component Value Date/Time   PROT 6.6 08/11/2019 0955   ALBUMIN 4.2 08/11/2019 0955   AST 12 08/11/2019 0955   ALT 9 08/11/2019 0955   ALKPHOS 91 08/11/2019 0955   BILITOT 0.2 08/11/2019 0955      Component Value Date/Time   TSH 4.450 06/05/2018 0937   TSH 3.47 01/27/2018 1143     Ref. Range 08/11/2019 09:55  Vitamin D, 25-Hydroxy Latest Ref Range: 30.0 - 100.0 ng/mL 46.6    OBESITY BEHAVIORAL INTERVENTION VISIT  Today's visit was # 21   Starting weight: 298 lbs Starting date: 06/05/2018 Today's weight : 268 lbs Today's date: 10/15/2019 Total lbs lost to date: 30    10/15/2019  Height 5\' 4"  (1.626 m)  Weight 268 lb (121.6 kg)  BMI (Calculated) 45.98  BLOOD PRESSURE - SYSTOLIC 128  BLOOD PRESSURE - DIASTOLIC 83   Body Fat % 53.6 %  Total Body Water (lbs) 83.8 lbs  RMR 1953    ASK: We discussed the diagnosis of obesity with Rita Oharaorothy Ann Taylor today and [name] agreed to give us permission to discuss obesity behavioral modification therapy today.  ASSESS: [name] has the diagnosis of obesity and her BMI today is 6245.98 [name] is in the action stage of change   ADVISE: [name] was educated on the multiple health risks of obesity as well as the benefit of weight loss to improve her health. She was advised of the need for long term treatment and the importance of lifestyle modifications to improve her current health and to decrease her risk of future health problems.  AGREE: Multiple dietary modification options and treatment options were discussed and  Siobahn  agreed to follow the recommendations documented in the above note.  ARRANGE: [name] was educated on the importance of frequent visits to treat obesity as outlined per CMS and USPSTF guidelines and agreed to schedule her next follow up appointment today.  Cristi LoronI, Joanne Murray, am acting as transcriptionist for Alois Clicheracey Aguilar, PA-C I, Alois Clicheracey Aguilar, PA-C have reviewed above note and agree with its content

## 2019-10-20 ENCOUNTER — Other Ambulatory Visit: Payer: Self-pay

## 2019-10-20 ENCOUNTER — Encounter: Payer: Self-pay | Admitting: Family Medicine

## 2019-10-20 ENCOUNTER — Ambulatory Visit (INDEPENDENT_AMBULATORY_CARE_PROVIDER_SITE_OTHER): Payer: BC Managed Care – PPO | Admitting: Family Medicine

## 2019-10-20 DIAGNOSIS — R062 Wheezing: Secondary | ICD-10-CM

## 2019-10-20 DIAGNOSIS — R0602 Shortness of breath: Secondary | ICD-10-CM

## 2019-10-20 MED ORDER — PREDNISONE 20 MG PO TABS: 40.0000 mg | ORAL_TABLET | Freq: Every day | ORAL | 0 refills | Status: AC

## 2019-10-20 NOTE — Progress Notes (Signed)
Chief Complaint  Patient presents with  . Breathing Problem    Jamie Kato here for URI complaints. Due to COVID-19 pandemic, we are interacting via web portal for an electronic face-to-face visit. I verified patient's ID using 2 identifiers. Patient agreed to proceed with visit via this method. Patient is at home, I am at office. Patient and I are present for visit.   Duration: 3 days  Associated symptoms: wheezing, shortness of breath and chest tightness Denies: sinus congestion, sinus pain, rhinorrhea, itchy watery eyes, ear pain, ear drainage, sore throat, myalgia and fevers, gi s/s's Treatment to date: SABA Sick contacts: No  ROS:  Const: Denies fevers HEENT: As noted in HPI Lungs: + SOB  Past Medical History:  Diagnosis Date  . Anemia   . Asthma   . Blood in stool   . Colon polyps   . COPD (chronic obstructive pulmonary disease) ([place])   . Depression   . Depression   . GERD (gastroesophageal reflux disease)   . Heart murmur    childhood  . 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 History of blood transfusion 2006  . HLD (hyperlipidemia)   . Obesity   . Sleep apnea   . Thyroid disease    Exam No conversational dyspnea Age appropriate judgment and insight Nml affect and mood  SOB (shortness of breath) - Plan: Novel Coronavirus, NAA ([facility])  Wheezing - Plan: predniSONE (DELTASONE) 20 MG tablet  Ck for COVID. Pred burst. Cont maintenance inhalers.  Continue to push fluids, practice good hand hygiene, cover mouth when coughing. F/u prn. If starting to experience fevers, shaking, or shortness of breath, seek immediate care. Pt voiced understanding and agreement to the plan.  [place], DO 10/20/19 11:57 AM

## 2019-10-23 ENCOUNTER — Other Ambulatory Visit: Payer: Self-pay | Admitting: Family Medicine

## 2019-11-16 ENCOUNTER — Other Ambulatory Visit: Payer: Self-pay

## 2019-11-16 ENCOUNTER — Encounter (INDEPENDENT_AMBULATORY_CARE_PROVIDER_SITE_OTHER): Payer: Self-pay | Admitting: Physician Assistant

## 2019-11-16 ENCOUNTER — Ambulatory Visit (INDEPENDENT_AMBULATORY_CARE_PROVIDER_SITE_OTHER): Payer: BC Managed Care – PPO | Admitting: Physician Assistant

## 2019-11-16 VITALS — BP 133/61 | HR 84 | Temp 98.2°F | Ht 64.0 in | Wt 271.0 lb

## 2019-11-16 DIAGNOSIS — Z9189 Other specified personal risk factors, not elsewhere classified: Secondary | ICD-10-CM

## 2019-11-16 DIAGNOSIS — E559 Vitamin D deficiency, unspecified: Secondary | ICD-10-CM

## 2019-11-16 DIAGNOSIS — Z6841 Body Mass Index (BMI) 40.0 and over, adult: Secondary | ICD-10-CM

## 2019-11-16 DIAGNOSIS — E7849 Other hyperlipidemia: Secondary | ICD-10-CM

## 2019-11-16 MED ORDER — VITAMIN D (ERGOCALCIFEROL) 1.25 MG (50000 UNIT) PO CAPS: 50000.0000 [IU] | ORAL_CAPSULE | ORAL | 0 refills | Status: DC

## 2019-11-16 MED ORDER — ATORVASTATIN CALCIUM 20 MG PO TABS: 20.0000 mg | ORAL_TABLET | Freq: Every day | ORAL | 0 refills | Status: DC

## 2019-11-17 ENCOUNTER — Other Ambulatory Visit: Payer: Self-pay | Admitting: Family Medicine

## 2019-11-17 MED ORDER — ALBUTEROL SULFATE HFA 108 (90 BASE) MCG/ACT IN AERS: 1.0000 | INHALATION_SPRAY | Freq: Four times a day (QID) | RESPIRATORY_TRACT | 3 refills | Status: DC | PRN

## 2019-11-17 NOTE — Telephone Encounter (Signed)
albuterol (PROVENTIL HFA;VENTOLIN HFA) 108 (90 Base) MCG/ACT inhaler  [id number] - [place], [place] - [address] AT [place] [address] Phone:  [phone]  Fax:  [phone]     Pls call this is asap, Ms Pamela [name] is about having panic attacks without, saw Nani Ravens just a couple weeks ago but did not realize she was out.

## 2019-11-18 NOTE — Progress Notes (Signed)
Office: [phone]  /  Fax: [phone]   HPI:  Chief Complaint: OBESITY Pamela [name] is here to discuss her progress with her obesity treatment plan. She is on the keep a food journal with 1350 calories and 85 grams of protein daily plan and states she is following her eating plan approximately 100 % of the time. She states she is doing water aerobics 60 minutes 2 times per week.  Pamela [name] has been using protein powder in her coffee in the morning, and she is counting all of it towards her protein goal. She is not getting all of her protein in during the day in real food.   Vitamin D deficiency Pamela [name] has a diagnosis of vitamin D deficiency. Pamela [name] is on vitamin D and she denies nausea, vomiting or muscle weakness.  Hyperlipidemia Pamela [name] has hyperlipidemia and she is on Atorvastatin. She has no chest pain. Pamela [name] is doing aerobics twice weekly.    At risk for cardiovascular disease Pamela [name] is at a higher than average risk for cardiovascular disease due to obesity and hyperlipidemia. She currently denies any chest pain.   Today's visit was # 22 Starting weight: 298 lbs Starting date: 06/05/2018 Today's weight : 271 lbs Today's date: 11/16/2019 Total lbs lost to date: 27 Total lbs lost since last in-office visit: 0  ASSESSMENT AND PLAN:  Vitamin D deficiency - Plan: Vitamin D, Ergocalciferol, ([name]) 1.25 MG (50000 UT) CAPS capsule  Other hyperlipidemia - Plan: atorvastatin (LIPITOR) 20 MG tablet  At risk for heart disease  Class 3 severe obesity with serious comorbidity and body mass index (BMI) of 45.0 to 49.9 in adult, unspecified obesity type (HCC)  PLAN:  Vitamin D Deficiency Low vitamin D level contributes to fatigue and are associated with obesity, breast, and colon cancer. Lianne agrees to continue to take prescription Vit D @50 ,000 IU every 3 days #10 with no refills and she will follow up for routine testing of vitamin D, at least 2-3 times per year to avoid  over-replacement. Armetta agrees to follow up as directed.  Hyperlipidemia Intensive lifestyle modifications as the first line treatment for hyperlipidemia. We discussed many lifestyle modifications today and Pamela [name] agrees to continue Atorvastatin 20 mg daily #30 with no refills and she will continue to work on diet, exercise and weight loss efforts. Arnika agrees to follow up with our clinic in 3 weeks.  Cardiovascular risk counseling Pamela [name] was given (~15 minutes) coronary artery disease prevention counseling today. She is 58 y.o. female and has risk factors for heart disease including obesity and hyperlipidemia. We discussed intensive lifestyle modifications today with an emphasis on specific weight loss instructions and strategies.   Obesity Pamela [name] is currently in the action stage of change. As such, her goal is to continue with weight loss efforts She has agreed to keep a food journal with 1450 calories and 85 grams of protein daily. Pamela [name] has been instructed to work up to a goal of 150 minutes of combined cardio and strengthening exercise per week for weight loss and overall health benefits. We discussed the following Behavioral Modification Strategies today: keeping healthy foods in the home and work on meal planning and easy cooking plans.  Pamela [name] has agreed to follow up with our clinic in 3 weeks. She was informed of the importance of frequent follow up visits to maximize her success with intensive lifestyle modifications for her multiple health conditions.  ALLERGIES: No Known Allergies  MEDICATIONS: Current Outpatient Medications on File Prior to Visit  Medication Sig Dispense  Refill  . citalopram (CELEXA) 20 MG tablet Take 20 mg by mouth daily.    . clobetasol (TEMOVATE) 0.05 % external solution Apply 1 application topically 2 (two) times daily as needed (irritation).     [name] ketoconazole (NIZORAL) 2 % cream APPLY TO AREA ON LEFT ARM AND LEFT LEG DAILY FOR 14 DAYS 30 g 0  .  levothyroxine (SYNTHROID, LEVOTHROID) 150 MCG tablet Take 1 tablet (150 mcg total) by mouth daily before breakfast. 30 tablet 3  . Melatonin 5 MG TABS Take 5 mg by mouth at bedtime.    . modafinil (PROVIGIL) 200 MG tablet Take 200 mg by mouth daily.    [name] omeprazole (PRILOSEC) 40 MG capsule Take 1 capsule (40 mg total) by mouth daily as needed (heartburn). 90 capsule 1  . oxyCODONE (OXY IR/ROXICODONE) 5 MG immediate release tablet Take 1 tablet (5 mg total) by mouth every 6 (six) hours as needed for moderate pain, severe pain or breakthrough pain. 25 tablet 0  . umeclidinium bromide (INCRUSE ELLIPTA) 62.5 MCG/INH AEPB Inhale 1 puff into the lungs daily. 30 each 3  . WIXELA INHUB 500-50 MCG/DOSE AEPB INHALE 1 PUFF INTO THE LUNGS TWICE DAILY 60 each 0   No current facility-administered medications on file prior to visit.    PAST MEDICAL HISTORY: Past Medical History:  Diagnosis Date  . Anemia   . Asthma   . Blood in stool   . Colon polyps   . COPD (chronic obstructive pulmonary disease) (HCC)   . Depression   . Depression   . GERD (gastroesophageal reflux disease)   . Heart murmur    childhood  . 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 History of blood transfusion 2006  . HLD (hyperlipidemia)   . Obesity   . Sleep apnea   . Thyroid disease     PAST SURGICAL HISTORY: Past Surgical History:  Procedure Laterality Date  . AUGMENTATION MAMMAPLASTY    . BREAST BIOPSY    . INSERTION OF MESH N/A 07/16/2019   Procedure: Insertion Of Mesh;  Surgeon: Abigail Miyamoto, MD;  Location: [facility] OR;  Service: General;  Laterality: N/A;  . LAPAROSCOPIC GASTRIC BANDING  2010  . VENTRAL HERNIA REPAIR N/A 07/16/2019   Procedure: VENTRAL INCISIONAL HERNIA REPAIR WITH MESH;  Surgeon: Abigail Miyamoto, MD;  Location: [facility] OR;  Service: General;  Laterality: N/A;    SOCIAL HISTORY: Social History   Tobacco Use  . Smoking status: Current Some Day Smoker    Packs/day: 0.25     Types: Cigarettes  . Smokeless tobacco: Never Used  Substance Use Topics  . Alcohol use: No    Comment: 13 years sober  . Drug use: No    FAMILY HISTORY: Family History  Problem Relation Age of Onset  . Alcohol abuse Mother   . Depression Mother   . Early death Mother   . Liver disease Mother   . Alcohol abuse Father   . Obesity Father   . Cancer Father     ROS: Review of Systems  Constitutional: Negative for weight loss.  Cardiovascular: Negative for chest pain.  Gastrointestinal: Negative for nausea and vomiting.  Musculoskeletal:       Negative for muscle weakness    PHYSICAL EXAM: Blood pressure 133/61, pulse 84, temperature 98.2 F (36.8 C), temperature source Oral, height 5\' 4"  (1.626 m), weight 271 lb (122.9 kg), SpO2 95 %. Body mass index is 46.52 kg/m. Physical  Exam Vitals reviewed.  Constitutional:      General: She is not in acute distress.    Appearance: Normal appearance. She is well-developed. She is obese.  Cardiovascular:     Rate and Rhythm: Normal rate.  Pulmonary:     Effort: Pulmonary effort is normal.  Musculoskeletal:        General: Normal range of motion.  Skin:    General: Skin is warm and dry.  Neurological:     Mental Status: She is alert and oriented to person, place, and time.  Psychiatric:        Mood and Affect: Mood normal.        Behavior: Behavior normal.     RECENT LABS AND TESTS: BMET    Component Value Date/Time   NA 140 08/11/2019 0955   K 4.7 08/11/2019 0955   CL 101 08/11/2019 0955   CO2 21 08/11/2019 0955   GLUCOSE 102 (H) 08/11/2019 0955   GLUCOSE 122 (H) 07/13/2019 0853   BUN 15 08/11/2019 0955   CREATININE 0.59 08/11/2019 0955   CALCIUM 9.2 08/11/2019 0955   GFRNONAA 101 08/11/2019 0955   GFRAA 116 08/11/2019 0955   Lab Results  Component Value Date   HGBA1C 5.8 (H) 08/11/2019   HGBA1C 5.9 (H) 02/02/2019   HGBA1C 5.9 (H) 09/25/2018   HGBA1C 6.0 (H) 06/23/2018   Lab Results  Component Value Date     INSULIN 14.4 08/11/2019   INSULIN 13.7 02/02/2019   INSULIN 13.5 09/25/2018   INSULIN 10.9 06/05/2018   CBC    Component Value Date/Time   WBC 7.1 07/13/2019 0853   RBC 5.33 (H) 07/13/2019 0853   HGB 15.7 (H) 07/13/2019 0853   HGB 15.2 06/05/2018 0937   HCT 47.8 (H) 07/13/2019 0853   HCT 45.3 06/05/2018 0937   PLT 359 07/13/2019 0853   MCV 89.7 07/13/2019 0853   MCV 87 06/05/2018 0937   MCH 29.5 07/13/2019 0853   MCHC 32.8 07/13/2019 0853   RDW 14.3 07/13/2019 0853   RDW 15.2 06/05/2018 0937   LYMPHSABS 2.1 06/05/2018 0937   EOSABS 0.1 06/05/2018 0937   BASOSABS 0.1 06/05/2018 0937   Iron/TIBC/Ferritin/ %Sat No results found for: IRON, TIBC, FERRITIN, IRONPCTSAT Lipid Panel     Component Value Date/Time   CHOL 252 (H) 08/11/2019 0955   TRIG 98 08/11/2019 0955   HDL 50 08/11/2019 0955   CHOLHDL 5 05/23/2018 0853   VLDL 16.6 05/23/2018 0853   LDLCALC 185 (H) 08/11/2019 0955   Hepatic Function Panel     Component Value Date/Time   PROT 6.6 08/11/2019 0955   ALBUMIN 4.2 08/11/2019 0955   AST 12 08/11/2019 0955   ALT 9 08/11/2019 0955   ALKPHOS 91 08/11/2019 0955   BILITOT 0.2 08/11/2019 0955      Component Value Date/Time   TSH 4.450 06/05/2018 0937   TSH 3.47 01/27/2018 1143    Ref. Range 08/11/2019 09:55  Vitamin D, 25-Hydroxy Latest Ref Range: 30.0 - 100.0 ng/mL 46.6    I, Doreene Nest, am acting as Location manager for Abby Potash, PA-C I, Abby Potash, PA-C have reviewed above note and agree with its content

## 2019-11-20 ENCOUNTER — Encounter: Payer: Self-pay | Admitting: Family Medicine

## 2019-11-28 ENCOUNTER — Other Ambulatory Visit (INDEPENDENT_AMBULATORY_CARE_PROVIDER_SITE_OTHER): Payer: Self-pay | Admitting: Physician Assistant

## 2019-11-28 DIAGNOSIS — E7849 Other hyperlipidemia: Secondary | ICD-10-CM

## 2019-12-04 ENCOUNTER — Other Ambulatory Visit: Payer: Self-pay | Admitting: Family Medicine

## 2019-12-04 DIAGNOSIS — B354 Tinea corporis: Secondary | ICD-10-CM

## 2019-12-04 MED ORDER — KETOCONAZOLE 2 % EX CREA: TOPICAL_CREAM | Freq: Every day | CUTANEOUS | 0 refills | Status: DC

## 2019-12-17 ENCOUNTER — Encounter (INDEPENDENT_AMBULATORY_CARE_PROVIDER_SITE_OTHER): Payer: Self-pay | Admitting: Physician Assistant

## 2019-12-17 ENCOUNTER — Other Ambulatory Visit: Payer: Self-pay

## 2019-12-17 ENCOUNTER — Ambulatory Visit (INDEPENDENT_AMBULATORY_CARE_PROVIDER_SITE_OTHER): Payer: BC Managed Care – PPO | Admitting: Physician Assistant

## 2019-12-17 VITALS — BP 140/91 | HR 91 | Temp 98.6°F | Ht 64.0 in | Wt 272.0 lb

## 2019-12-17 DIAGNOSIS — Z9189 Other specified personal risk factors, not elsewhere classified: Secondary | ICD-10-CM

## 2019-12-17 DIAGNOSIS — Z6841 Body Mass Index (BMI) 40.0 and over, adult: Secondary | ICD-10-CM

## 2019-12-17 DIAGNOSIS — E7849 Other hyperlipidemia: Secondary | ICD-10-CM

## 2019-12-17 DIAGNOSIS — E559 Vitamin D deficiency, unspecified: Secondary | ICD-10-CM | POA: Diagnosis not present

## 2019-12-17 MED ORDER — VITAMIN D (ERGOCALCIFEROL) 1.25 MG (50000 UNIT) PO CAPS: 50000.0000 [IU] | ORAL_CAPSULE | ORAL | 0 refills | Status: DC

## 2019-12-17 NOTE — Progress Notes (Signed)
Chief Complaint:   OBESITY Pamela [name] is here to discuss her progress with her obesity treatment plan along with follow-up of her obesity related diagnoses. Pamela [name] is on keeping a food journal and adhering to recommended goals of 1450 calories and 85 grams of protein and states she is following her eating plan approximately 50% of the time. Pamela [name] states she is exercising for 0 minutes 0 times per week.  Today's visit was #: 23 Starting weight: 298 lbs Starting date: 06/05/2018 Today's weight: 272 lbs Today's date: 12/17/2019 Total lbs lost to date: 26 lbs Total lbs lost since last in-office visit: 0  Interim History: Pamela [name] is not reaching her protein goal daily.  She is not doing water aerobics because she missed the cutoff date for the class.  She will restart next week.  Subjective:   1. Vitamin D deficiency Pamela [name]'s Vitamin D level was 46.6 on 08/11/2019. She is currently taking vit D. She denies nausea, vomiting or muscle weakness.  2. Other hyperlipidemia Pamela [name] has hyperlipidemia and has been trying to improve her cholesterol levels with intensive lifestyle modification including a low saturated fat diet weight loss. She denies any chest pain, claudication or myalgias.  She is taking atorvastatin.  Not exercising at this time.  Lab Results  Component Value Date   ALT 9 08/11/2019   AST 12 08/11/2019   ALKPHOS 91 08/11/2019   BILITOT 0.2 08/11/2019   Lab Results  Component Value Date   CHOL 252 (H) 08/11/2019   HDL 50 08/11/2019   LDLCALC 185 (H) 08/11/2019   TRIG 98 08/11/2019   CHOLHDL 5 05/23/2018   3. At risk for osteoporosis Pamela [name] is at higher risk of osteopenia and osteoporosis due to Vitamin D deficiency.   Assessment/Plan:   1. Vitamin D deficiency Low Vitamin D level contributes to fatigue and are associated with obesity, breast, and colon cancer. She agrees to continue to take prescription Vitamin D @50 ,000 IU every week and will follow-up for  routine testing of Vitamin D, at least 2-3 times per year to avoid over-replacement. - Vitamin D, Ergocalciferol, ([name]) 1.25 MG (50000 UNIT) CAPS capsule; Take 1 capsule (50,000 Units total) by mouth every 3 (three) days.  Dispense: 10 capsule; Refill: 0  2. Other hyperlipidemia Cardiovascular risk and specific lipid/LDL goals reviewed.  We discussed several lifestyle modifications today and Mirel will continue to work on diet and weight loss efforts. Orders and follow up as documented in patient record.   Counseling Intensive lifestyle modifications are the first line treatment for this issue. . Dietary changes: Increase soluble fiber. Decrease simple carbohydrates. . Exercise changes: Moderate to vigorous-intensity aerobic activity 150 minutes per week if tolerated. . Lipid-lowering medications: see documented in medical record.  3. At risk for osteoporosis Pamela [name] was given approximately 15 minutes of osteoporosis prevention counseling today. Pamela [name] is at risk for osteopenia and osteoporosis due to her Vitamin D deficiency. She was encouraged to take her Vitamin D and follow her higher calcium diet and increase strengthening exercise to help strengthen her bones and decrease her risk of osteopenia and osteoporosis.  Repetitive spaced learning was employed today to elicit superior memory formation and behavioral change.  4. Class 3 severe obesity with serious comorbidity and body mass index (BMI) of 45.0 to 49.9 in adult, unspecified obesity type Pamela [name]) Pamela [name] is currently in the action stage of change. As such, her goal is to continue with weight loss efforts. She has agreed to keeping a food journal and  adhering to recommended goals of 1450 calories and 90 grams of protein.   Exercise goals: For substantial health benefits, adults should do at least 150 minutes (2 hours and 30 minutes) a week of moderate-intensity, or 75 minutes (1 hour and 15 minutes) a week of vigorous-intensity  aerobic physical activity, or an equivalent combination of moderate- and vigorous-intensity aerobic activity. Aerobic activity should be performed in episodes of at least 10 minutes, and preferably, it should be spread throughout the week. Adults should also include muscle-strengthening activities that involve all major muscle groups on 2 or more days a week.  Behavioral modification strategies: increasing lean protein intake and meal planning and cooking strategies.  Pamela [name] has agreed to follow-up with our clinic in 3 weeks. She was informed of the importance of frequent follow-up visits to maximize her success with intensive lifestyle modifications for her multiple health conditions.   Objective:   Blood pressure (!) 140/91, pulse 91, temperature 98.6 F (37 C), temperature source Oral, height 5\' 4"  (1.626 m), weight 272 lb (123.4 kg), SpO2 93 %. Body mass index is 46.69 kg/m.  General: Cooperative, alert, well developed, in no acute distress. HEENT: Conjunctivae and lids unremarkable. Cardiovascular: Regular rhythm.  Lungs: Normal work of breathing. Neurologic: No focal deficits.   Lab Results  Component Value Date   CREATININE 0.59 08/11/2019   BUN 15 08/11/2019   NA 140 08/11/2019   K 4.7 08/11/2019   CL 101 08/11/2019   CO2 21 08/11/2019   Lab Results  Component Value Date   ALT 9 08/11/2019   AST 12 08/11/2019   ALKPHOS 91 08/11/2019   BILITOT 0.2 08/11/2019   Lab Results  Component Value Date   HGBA1C 5.8 (H) 08/11/2019   HGBA1C 5.9 (H) 02/02/2019   HGBA1C 5.9 (H) 09/25/2018   HGBA1C 6.0 (H) 06/23/2018   Lab Results  Component Value Date   INSULIN 14.4 08/11/2019   INSULIN 13.7 02/02/2019   INSULIN 13.5 09/25/2018   INSULIN 10.9 06/05/2018   Lab Results  Component Value Date   TSH 4.450 06/05/2018   Lab Results  Component Value Date   CHOL 252 (H) 08/11/2019   HDL 50 08/11/2019   LDLCALC 185 (H) 08/11/2019   TRIG 98 08/11/2019   CHOLHDL 5 05/23/2018    Lab Results  Component Value Date   WBC 7.1 07/13/2019   HGB 15.7 (H) 07/13/2019   HCT 47.8 (H) 07/13/2019   MCV 89.7 07/13/2019   PLT 359 07/13/2019   Attestation Statements:   Reviewed by clinician on day of visit: allergies, medications, problem list, medical history, surgical history, family history, social history, and previous encounter notes.  I, Water quality scientist, CMA, am acting as Location manager for [employer], PA-C.  I have reviewed the above documentation for accuracy and completeness, and I agree with the above. Abby Potash, PA-C

## 2019-12-27 ENCOUNTER — Other Ambulatory Visit (INDEPENDENT_AMBULATORY_CARE_PROVIDER_SITE_OTHER): Payer: Self-pay | Admitting: Physician Assistant

## 2019-12-27 DIAGNOSIS — E7849 Other hyperlipidemia: Secondary | ICD-10-CM

## 2019-12-28 ENCOUNTER — Other Ambulatory Visit: Payer: Self-pay | Admitting: Family Medicine

## 2019-12-28 MED ORDER — FLUTICASONE-SALMETEROL 500-50 MCG/DOSE IN AEPB: INHALATION_SPRAY | RESPIRATORY_TRACT | 0 refills | Status: DC

## 2019-12-29 ENCOUNTER — Encounter: Payer: Self-pay | Admitting: Family Medicine

## 2019-12-30 MED ORDER — SPIRIVA HANDIHALER 18 MCG IN CAPS: 18.0000 ug | ORAL_CAPSULE | Freq: Every day | RESPIRATORY_TRACT | 12 refills | Status: DC

## 2020-01-05 ENCOUNTER — Encounter (INDEPENDENT_AMBULATORY_CARE_PROVIDER_SITE_OTHER): Payer: Self-pay | Admitting: Physician Assistant

## 2020-01-05 ENCOUNTER — Telehealth (INDEPENDENT_AMBULATORY_CARE_PROVIDER_SITE_OTHER): Payer: BC Managed Care – PPO | Admitting: Physician Assistant

## 2020-01-05 ENCOUNTER — Other Ambulatory Visit: Payer: Self-pay

## 2020-01-05 DIAGNOSIS — Z6841 Body Mass Index (BMI) 40.0 and over, adult: Secondary | ICD-10-CM

## 2020-01-05 DIAGNOSIS — E7849 Other hyperlipidemia: Secondary | ICD-10-CM

## 2020-01-05 NOTE — Telephone Encounter (Signed)
Please review

## 2020-01-06 NOTE — Progress Notes (Signed)
TeleHealth Visit:  Due to the COVID-19 pandemic, this visit was completed with telemedicine (audio/video) technology to reduce patient and provider exposure as well as to preserve personal protective equipment.   Secret has verbally consented to this TeleHealth visit. The patient is located at her office, the provider is located at the [facility] office. The participants in this visit include the listed provider and patient. The visit was conducted today via Face Time.  Chief Complaint: OBESITY Beautiful is here to discuss her progress with her obesity treatment plan along with follow-up of her obesity related diagnoses. Shanterica is on keeping a food journal and adhering to recommended goals of 1450 calories and 85 grams of protein and states she is following her eating plan approximately 100% of the time. Deserea states she is doing water aerobics for 60 minutes 2 times per week.  Today's visit was #: 24 Starting weight: 298 lbs Starting date: 06/05/2018  Interim History: Aanchal reports that she has stopped using protein powder in her coffee.  She is trying to reach her protein goal by eating real food.  Subjective:   1. Other hyperlipidemia Melonie has hyperlipidemia and has been trying to improve her cholesterol levels with intensive lifestyle modification including a low saturated fat diet, exercise and weight loss. She denies any chest pain, claudication or myalgias.  She is taking atorvastatin and exercising twice weekly.  Lab Results  Component Value Date   ALT 9 08/11/2019   AST 12 08/11/2019   ALKPHOS 91 08/11/2019   BILITOT 0.2 08/11/2019   Lab Results  Component Value Date   CHOL 252 (H) 08/11/2019   HDL 50 08/11/2019   LDLCALC 185 (H) 08/11/2019   TRIG 98 08/11/2019   CHOLHDL 5 05/23/2018   Assessment/Plan:   1. Other hyperlipidemia Cardiovascular risk and specific lipid/LDL goals reviewed.  We discussed several lifestyle modifications today and  Alaena will continue to work on diet, exercise and weight loss efforts. Orders and follow up as documented in patient record.   Counseling Intensive lifestyle modifications are the first line treatment for this issue. . Dietary changes: Increase soluble fiber. Decrease simple carbohydrates. . Exercise changes: Moderate to vigorous-intensity aerobic activity 150 minutes per week if tolerated. . Lipid-lowering medications: see documented in medical record.  2. Class 3 severe obesity with serious comorbidity and body mass index (BMI) of 45.0 to 49.9 in adult, unspecified obesity type [facility]) Francheska is currently in the action stage of change. As such, her goal is to continue with weight loss efforts. She has agreed to keeping a food journal and adhering to recommended goals of 1450 calories and 85 grams of protein.   Exercise goals: For substantial health benefits, adults should do at least 150 minutes (2 hours and 30 minutes) a week of moderate-intensity, or 75 minutes (1 hour and 15 minutes) a week of vigorous-intensity aerobic physical activity, or an equivalent combination of moderate- and vigorous-intensity aerobic activity. Aerobic activity should be performed in episodes of at least 10 minutes, and preferably, it should be spread throughout the week. Adults should also include muscle-strengthening activities that involve all major muscle groups on 2 or more days a week.  Behavioral modification strategies: increasing lean protein intake and meal planning and cooking strategies.  Shirlena has agreed to follow-up with our clinic in 2 weeks. She was informed of the importance of frequent follow-up visits to maximize her success with intensive lifestyle modifications for her multiple health conditions.  Objective:  VITALS: Per patient if applicable, see vitals. GENERAL: Alert and in no acute distress. CARDIOPULMONARY: No increased WOB. Speaking in clear sentences.  PSYCH: Pleasant and  cooperative. Speech normal rate and rhythm. Affect is appropriate. Insight and judgement are appropriate. Attention is focused, linear, and appropriate.  NEURO: Oriented as arrived to appointment on time with no prompting.   Lab Results  Component Value Date   CREATININE 0.59 08/11/2019   BUN 15 08/11/2019   NA 140 08/11/2019   K 4.7 08/11/2019   CL 101 08/11/2019   CO2 21 08/11/2019   Lab Results  Component Value Date   ALT 9 08/11/2019   AST 12 08/11/2019   ALKPHOS 91 08/11/2019   BILITOT 0.2 08/11/2019   Lab Results  Component Value Date   HGBA1C 5.8 (H) 08/11/2019   HGBA1C 5.9 (H) 02/02/2019   HGBA1C 5.9 (H) 09/25/2018   HGBA1C 6.0 (H) 06/23/2018   Lab Results  Component Value Date   INSULIN 14.4 08/11/2019   INSULIN 13.7 02/02/2019   INSULIN 13.5 09/25/2018   INSULIN 10.9 06/05/2018   Lab Results  Component Value Date   TSH 4.450 06/05/2018   Lab Results  Component Value Date   CHOL 252 (H) 08/11/2019   HDL 50 08/11/2019   LDLCALC 185 (H) 08/11/2019   TRIG 98 08/11/2019   CHOLHDL 5 05/23/2018   Lab Results  Component Value Date   WBC 7.1 07/13/2019   HGB 15.7 (H) 07/13/2019   HCT 47.8 (H) 07/13/2019   MCV 89.7 07/13/2019   PLT 359 07/13/2019   Attestation Statements:   Reviewed by clinician on day of visit: allergies, medications, problem list, medical history, surgical history, family history, social history, and previous encounter notes.  Time spent on visit including pre-visit chart review and post-visit care was 36 minutes.   I, Insurance claims handler, CMA, am acting as Energy manager for [employer], PA-C.  I have reviewed the above documentation for accuracy and completeness, and I agree with the above. Alois Cliche, PA-C

## 2020-01-25 ENCOUNTER — Other Ambulatory Visit: Payer: Self-pay | Admitting: Family Medicine

## 2020-01-25 ENCOUNTER — Other Ambulatory Visit (INDEPENDENT_AMBULATORY_CARE_PROVIDER_SITE_OTHER): Payer: Self-pay | Admitting: Physician Assistant

## 2020-01-25 DIAGNOSIS — E559 Vitamin D deficiency, unspecified: Secondary | ICD-10-CM

## 2020-01-25 DIAGNOSIS — E039 Hypothyroidism, unspecified: Secondary | ICD-10-CM

## 2020-01-25 DIAGNOSIS — E7849 Other hyperlipidemia: Secondary | ICD-10-CM

## 2020-01-26 ENCOUNTER — Telehealth: Payer: Self-pay

## 2020-01-26 DIAGNOSIS — B354 Tinea corporis: Secondary | ICD-10-CM

## 2020-01-26 MED ORDER — OMEPRAZOLE 40 MG PO CPDR: 40.0000 mg | DELAYED_RELEASE_CAPSULE | Freq: Every day | ORAL | 1 refills | Status: DC | PRN

## 2020-01-26 NOTE — Telephone Encounter (Signed)
Refill request from [facility] on [address] for the ketoconazole 2% cream. Last sent in on 12/04/2019.   Please advise if you approve refill.

## 2020-01-26 NOTE — Telephone Encounter (Signed)
That's fine

## 2020-01-27 ENCOUNTER — Encounter: Payer: Self-pay | Admitting: Family Medicine

## 2020-01-27 MED ORDER — KETOCONAZOLE 2 % EX CREA: TOPICAL_CREAM | Freq: Every day | CUTANEOUS | 0 refills | Status: DC

## 2020-01-27 NOTE — Addendum Note (Signed)
Addended by: Scharlene Gloss B on: 01/27/2020 07:28 AM   Modules accepted: Orders

## 2020-01-27 NOTE — Telephone Encounter (Signed)
Sent in cream

## 2020-01-28 ENCOUNTER — Other Ambulatory Visit: Payer: Self-pay

## 2020-01-28 MED ORDER — CLOBETASOL PROPIONATE 0.05 % EX SOLN: 1.0000 "application " | Freq: Two times a day (BID) | CUTANEOUS | 1 refills | Status: DC | PRN

## 2020-02-04 ENCOUNTER — Other Ambulatory Visit: Payer: Self-pay

## 2020-02-04 ENCOUNTER — Encounter (INDEPENDENT_AMBULATORY_CARE_PROVIDER_SITE_OTHER): Payer: Self-pay | Admitting: Physician Assistant

## 2020-02-04 ENCOUNTER — Ambulatory Visit (INDEPENDENT_AMBULATORY_CARE_PROVIDER_SITE_OTHER): Payer: BC Managed Care – PPO | Admitting: Physician Assistant

## 2020-02-04 VITALS — BP 136/78 | HR 104 | Temp 98.7°F | Ht 64.0 in | Wt 271.0 lb

## 2020-02-04 DIAGNOSIS — E559 Vitamin D deficiency, unspecified: Secondary | ICD-10-CM

## 2020-02-04 DIAGNOSIS — E7849 Other hyperlipidemia: Secondary | ICD-10-CM

## 2020-02-04 DIAGNOSIS — Z9189 Other specified personal risk factors, not elsewhere classified: Secondary | ICD-10-CM

## 2020-02-04 DIAGNOSIS — Z6841 Body Mass Index (BMI) 40.0 and over, adult: Secondary | ICD-10-CM

## 2020-02-04 MED ORDER — VITAMIN D (ERGOCALCIFEROL) 1.25 MG (50000 UNIT) PO CAPS: 50000.0000 [IU] | ORAL_CAPSULE | ORAL | 0 refills | Status: DC

## 2020-02-04 MED ORDER — ATORVASTATIN CALCIUM 20 MG PO TABS: 20.0000 mg | ORAL_TABLET | Freq: Every day | ORAL | 0 refills | Status: DC

## 2020-02-04 NOTE — Progress Notes (Signed)
Chief Complaint:   OBESITY Pamela [name] is here to discuss her progress with her obesity treatment plan along with follow-up of her obesity related diagnoses. Pamela [name] is on keeping a food journal and adhering to recommended goals of 1450 calories and 90 grams of protein and states she is following her eating plan approximately 100% of the time. Pamela [name] states she is doing water aerobics for 60 minutes 2 times per week.  Today's visit was #: 25 Starting weight: 298 lbs Starting date: 06/05/2018 Today's weight: 271 lbs Today's date: 02/04/2020 Total lbs lost to date: 27 lbs Total lbs lost since last in-office visit: 1 lb  Interim History: Pamela [name] reports struggling over Valentine's Day due to being given a lot of candy.  She started eating more brisket and reached her protein goal those days.  Subjective:   1. Vitamin D deficiency Pamela [name]'s Vitamin D level was 46.6 on 08/11/2019. She is currently taking vit D. She denies nausea, vomiting or muscle weakness.  2. Other hyperlipidemia Pamela [name] has hyperlipidemia and has been trying to improve her cholesterol levels with intensive lifestyle modification including a low saturated fat diet, exercise and weight loss. She denies any chest pain, claudication or myalgias.  Lab Results  Component Value Date   ALT 9 08/11/2019   AST 12 08/11/2019   ALKPHOS 91 08/11/2019   BILITOT 0.2 08/11/2019   Lab Results  Component Value Date   CHOL 252 (H) 08/11/2019   HDL 50 08/11/2019   LDLCALC 185 (H) 08/11/2019   TRIG 98 08/11/2019   CHOLHDL 5 05/23/2018   3. At risk for heart disease Pamela [name] is at a higher than average risk for cardiovascular disease due to obesity. Reviewed: no chest pain on exertion, no dyspnea on exertion, and no swelling of ankles.  Assessment/Plan:   1. Vitamin D deficiency Low Vitamin D level contributes to fatigue and are associated with obesity, breast, and colon cancer. She agrees to continue to take prescription  Vitamin D @50 ,000 IU every week and will follow-up for routine testing of Vitamin D, at least 2-3 times per year to avoid over-replacement. - Vitamin D, Ergocalciferol, ([name]) 1.25 MG (50000 UNIT) CAPS capsule; Take 1 capsule (50,000 Units total) by mouth every 3 (three) days.  Dispense: 10 capsule; Refill: 0  2. Other hyperlipidemia Cardiovascular risk and specific lipid/LDL goals reviewed.  We discussed several lifestyle modifications today and Pamela [name] will continue to work on diet, exercise and weight loss efforts. Orders and follow up as documented in patient record.   Counseling Intensive lifestyle modifications are the first line treatment for this issue. . Dietary changes: Increase soluble fiber. Decrease simple carbohydrates. . Exercise changes: Moderate to vigorous-intensity aerobic activity 150 minutes per week if tolerated. . Lipid-lowering medications: see documented in medical record. - atorvastatin (LIPITOR) 20 MG tablet; Take 1 tablet (20 mg total) by mouth at bedtime.  Dispense: 30 tablet; Refill: 0  3. At risk for heart disease Pamela [name] was given approximately 15 minutes of coronary artery disease prevention counseling today. She is 59 y.o. female and has risk factors for heart disease including obesity. We discussed intensive lifestyle modifications today with an emphasis on specific weight loss instructions and strategies.   Repetitive spaced learning was employed today to elicit superior memory formation and behavioral change.  4. Class 3 severe obesity with serious comorbidity and body mass index (BMI) of 45.0 to 49.9 in adult, unspecified obesity type [facility]) Pamela [name] is currently in the action stage of change. As such,  her goal is to continue with weight loss efforts. She has agreed to keeping a food journal and adhering to recommended goals of 1450 calories and 90 grams of protein.   Exercise goals: As is.  Behavioral modification strategies: increasing lean protein  intake and meal planning and cooking strategies.  Twala has agreed to follow-up with our clinic in 3 weeks. She was informed of the importance of frequent follow-up visits to maximize her success with intensive lifestyle modifications for her multiple health conditions.   Objective:   Blood pressure 136/78, pulse (!) 104, temperature 98.7 F (37.1 C), temperature source Oral, height 5\' 4"  (1.626 m), weight 271 lb (122.9 kg), SpO2 93 %. Body mass index is 46.52 kg/m.  General: Cooperative, alert, well developed, in no acute distress. HEENT: Conjunctivae and lids unremarkable. Cardiovascular: Regular rhythm.  Lungs: Normal work of breathing. Neurologic: No focal deficits.   Lab Results  Component Value Date   CREATININE 0.59 08/11/2019   BUN 15 08/11/2019   NA 140 08/11/2019   K 4.7 08/11/2019   CL 101 08/11/2019   CO2 21 08/11/2019   Lab Results  Component Value Date   ALT 9 08/11/2019   AST 12 08/11/2019   ALKPHOS 91 08/11/2019   BILITOT 0.2 08/11/2019   Lab Results  Component Value Date   HGBA1C 5.8 (H) 08/11/2019   HGBA1C 5.9 (H) 02/02/2019   HGBA1C 5.9 (H) 09/25/2018   HGBA1C 6.0 (H) 06/23/2018   Lab Results  Component Value Date   INSULIN 14.4 08/11/2019   INSULIN 13.7 02/02/2019   INSULIN 13.5 09/25/2018   INSULIN 10.9 06/05/2018   Lab Results  Component Value Date   TSH 4.450 06/05/2018   Lab Results  Component Value Date   CHOL 252 (H) 08/11/2019   HDL 50 08/11/2019   LDLCALC 185 (H) 08/11/2019   TRIG 98 08/11/2019   CHOLHDL 5 05/23/2018   Lab Results  Component Value Date   WBC 7.1 07/13/2019   HGB 15.7 (H) 07/13/2019   HCT 47.8 (H) 07/13/2019   MCV 89.7 07/13/2019   PLT 359 07/13/2019   Attestation Statements:   Reviewed by clinician on day of visit: allergies, medications, problem list, medical history, surgical history, family history, social history, and previous encounter notes.  I, Water quality scientist, CMA, am acting as Location manager  for [employer], PA-C.  I have reviewed the above documentation for accuracy and completeness, and I agree with the above. Abby Potash, PA-C

## 2020-03-01 ENCOUNTER — Encounter (INDEPENDENT_AMBULATORY_CARE_PROVIDER_SITE_OTHER): Payer: Self-pay | Admitting: Physician Assistant

## 2020-03-01 ENCOUNTER — Ambulatory Visit (INDEPENDENT_AMBULATORY_CARE_PROVIDER_SITE_OTHER): Payer: BC Managed Care – PPO | Admitting: Physician Assistant

## 2020-03-01 ENCOUNTER — Other Ambulatory Visit: Payer: Self-pay

## 2020-03-01 VITALS — BP 136/87 | HR 107 | Temp 98.0°F | Ht 64.0 in | Wt 272.0 lb

## 2020-03-01 DIAGNOSIS — E7849 Other hyperlipidemia: Secondary | ICD-10-CM

## 2020-03-01 DIAGNOSIS — R0602 Shortness of breath: Secondary | ICD-10-CM | POA: Diagnosis not present

## 2020-03-01 DIAGNOSIS — Z9189 Other specified personal risk factors, not elsewhere classified: Secondary | ICD-10-CM | POA: Diagnosis not present

## 2020-03-01 DIAGNOSIS — E559 Vitamin D deficiency, unspecified: Secondary | ICD-10-CM

## 2020-03-01 DIAGNOSIS — R7303 Prediabetes: Secondary | ICD-10-CM

## 2020-03-01 DIAGNOSIS — Z6841 Body Mass Index (BMI) 40.0 and over, adult: Secondary | ICD-10-CM

## 2020-03-01 MED ORDER — VITAMIN D (ERGOCALCIFEROL) 1.25 MG (50000 UNIT) PO CAPS: 50000.0000 [IU] | ORAL_CAPSULE | ORAL | 0 refills | Status: DC

## 2020-03-01 MED ORDER — ATORVASTATIN CALCIUM 20 MG PO TABS: 20.0000 mg | ORAL_TABLET | Freq: Every day | ORAL | 0 refills | Status: DC

## 2020-03-01 NOTE — Progress Notes (Signed)
Chief Complaint:   OBESITY Pamela [name] is here to discuss her progress with her obesity treatment plan along with follow-up of her obesity related diagnoses. Pamela [name] is on keeping a food journal and adhering to recommended goals of 1450 calories and 90 grams of protein and states she is following her eating plan approximately 100% of the time. Juan states she is doing water aerobics for 60 minutes 2 times per week.  Today's visit was #: 26 Starting weight: 298 lbs Starting date: 06/05/2018 Today's weight: 272 lbs Today's date: 03/01/2020 Total lbs lost to date: 26 lbs Total lbs lost since last in-office visit: 0  Interim History: Pamela [name] reports not eating enough protein some days and overeating her calories.  Subjective:   1. SOB (shortness of breath) with exertion No dizziness or lightheadedness.  2. Other hyperlipidemia Pamela [name] has hyperlipidemia and has been trying to improve her cholesterol levels with intensive lifestyle modification including a low saturated fat diet, exercise and weight loss. She denies any chest pain, claudication or myalgias.  She is taking Lipitor.    Lab Results  Component Value Date   ALT 9 08/11/2019   AST 12 08/11/2019   ALKPHOS 91 08/11/2019   BILITOT 0.2 08/11/2019   Lab Results  Component Value Date   CHOL 252 (H) 08/11/2019   HDL 50 08/11/2019   LDLCALC 185 (H) 08/11/2019   TRIG 98 08/11/2019   CHOLHDL 5 05/23/2018   3. Vitamin D deficiency Pamela [name]'s Vitamin D level was 46.6 on 08/11/2019. She is currently taking prescription vitamin D 50,000 IU twice weekly. She denies nausea, vomiting or muscle weakness.  4. Prediabetes Pamela [name] has a diagnosis of prediabetes based on her elevated HgA1c and was informed this puts her at greater risk of developing diabetes. She continues to work on diet and exercise to decrease her risk of diabetes. She denies nausea or hypoglycemia.  No medications.  Denies polyphagia.  Lab Results  Component Value  Date   HGBA1C 5.8 (H) 08/11/2019   Lab Results  Component Value Date   INSULIN 14.4 08/11/2019   INSULIN 13.7 02/02/2019   INSULIN 13.5 09/25/2018   INSULIN 10.9 06/05/2018   5. At risk for diabetes mellitus Pamela [name] is at higher than average risk for developing diabetes due to her obesity.   Assessment/Plan:   1. SOB (shortness of breath) with exertion Indirect calorimetry today.  2. Other hyperlipidemia Cardiovascular risk and specific lipid/LDL goals reviewed.  We discussed several lifestyle modifications today and Pamela [name] will continue to work on diet, exercise and weight loss efforts. Orders and follow up as documented in patient record.   Counseling Intensive lifestyle modifications are the first line treatment for this issue. . Dietary changes: Increase soluble fiber. Decrease simple carbohydrates. . Exercise changes: Moderate to vigorous-intensity aerobic activity 150 minutes per week if tolerated. . Lipid-lowering medications: see documented in medical record. - Comprehensive metabolic panel - Lipid Panel With LDL/HDL Ratio - atorvastatin (LIPITOR) 20 MG tablet; Take 1 tablet (20 mg total) by mouth at bedtime.  Dispense: 90 tablet; Refill: 0  3. Vitamin D deficiency Low Vitamin D level contributes to fatigue and are associated with obesity, breast, and colon cancer. She agrees to continue to take prescription Vitamin D @50 ,000 IU twice weekly and will follow-up for routine testing of Vitamin D, at least 2-3 times per year to avoid over-replacement. - VITAMIN D 25 Hydroxy (Vit-D Deficiency, Fractures) - Vitamin D, Ergocalciferol, ([name]) 1.25 MG (50000 UNIT) CAPS capsule; Take 1 capsule (  50,000 Units total) by mouth every 3 (three) days.  Dispense: 10 capsule; Refill: 0  4. Prediabetes Pamela [name] will continue to work on weight loss, exercise, and decreasing simple carbohydrates to help decrease the risk of diabetes.  - Hemoglobin A1c - Insulin, random  5. At risk for  diabetes mellitus Pamela [name] was given approximately 15 minutes of diabetes education and counseling today. We discussed intensive lifestyle modifications today with an emphasis on weight loss as well as increasing exercise and decreasing simple carbohydrates in her diet. We also reviewed medication options with an emphasis on risk versus benefit of those discussed.   Repetitive spaced learning was employed today to elicit superior memory formation and behavioral change.  6. Class 3 severe obesity with serious comorbidity and body mass index (BMI) of 45.0 to 49.9 in adult, unspecified obesity type [facility]) Caran is currently in the action stage of change. As such, her goal is to continue with weight loss efforts. She has agreed to keeping a food journal and adhering to recommended goals of 1200 calories and 80 grams of protein.   Exercise goals: As is.  Behavioral modification strategies: meal planning and cooking strategies and keeping healthy foods in the home.  Pamela [name] has agreed to follow-up with our clinic in 3 weeks. She was informed of the importance of frequent follow-up visits to maximize her success with intensive lifestyle modifications for her multiple health conditions.   Pamela [name] was informed we would discuss her lab results at her next visit unless there is a critical issue that needs to be addressed sooner. Pamela [name] agreed to keep her next visit at the agreed upon time to discuss these results.  Objective:   Blood pressure 136/87, pulse (!) 107, temperature 98 F (36.7 C), temperature source Oral, height 5\' 4"  (1.626 m), weight 272 lb (123.4 kg), SpO2 96 %. Body mass index is 46.69 kg/m.  General: Cooperative, alert, well developed, in no acute distress. HEENT: Conjunctivae and lids unremarkable. Cardiovascular: Regular rhythm.  Lungs: Normal work of breathing. Neurologic: No focal deficits.   Lab Results  Component Value Date   CREATININE 0.59 08/11/2019   BUN 15 08/11/2019    NA 140 08/11/2019   K 4.7 08/11/2019   CL 101 08/11/2019   CO2 21 08/11/2019   Lab Results  Component Value Date   ALT 9 08/11/2019   AST 12 08/11/2019   ALKPHOS 91 08/11/2019   BILITOT 0.2 08/11/2019   Lab Results  Component Value Date   HGBA1C 5.8 (H) 08/11/2019   HGBA1C 5.9 (H) 02/02/2019   HGBA1C 5.9 (H) 09/25/2018   HGBA1C 6.0 (H) 06/23/2018   Lab Results  Component Value Date   INSULIN 14.4 08/11/2019   INSULIN 13.7 02/02/2019   INSULIN 13.5 09/25/2018   INSULIN 10.9 06/05/2018   Lab Results  Component Value Date   TSH 4.450 06/05/2018   Lab Results  Component Value Date   CHOL 252 (H) 08/11/2019   HDL 50 08/11/2019   LDLCALC 185 (H) 08/11/2019   TRIG 98 08/11/2019   CHOLHDL 5 05/23/2018   Lab Results  Component Value Date   WBC 7.1 07/13/2019   HGB 15.7 (H) 07/13/2019   HCT 47.8 (H) 07/13/2019   MCV 89.7 07/13/2019   PLT 359 07/13/2019   Attestation Statements:   Reviewed by clinician on day of visit: allergies, medications, problem list, medical history, surgical history, family history, social history, and previous encounter notes.  I, Water quality scientist, CMA, am acting as Location manager for [employer]  Cephus Richer, PA-C.  I have reviewed the above documentation for accuracy and completeness, and I agree with the above. Alois Cliche, PA-C

## 2020-03-02 LAB — COMPREHENSIVE METABOLIC PANEL
ALT: 15 IU/L (ref 0–32)
AST: 15 IU/L (ref 0–40)
Albumin/Globulin Ratio: 1.7 (ref 1.2–2.2)
Albumin: 4.3 g/dL (ref 3.8–4.9)
Alkaline Phosphatase: 105 IU/L (ref 39–117)
BUN/Creatinine Ratio: 20 (ref 9–23)
BUN: 13 mg/dL (ref 6–24)
Bilirubin Total: 0.3 mg/dL (ref 0.0–1.2)
CO2: 19 mmol/L — ABNORMAL LOW (ref 20–29)
Calcium: 9.4 mg/dL (ref 8.7–10.2)
Chloride: 101 mmol/L (ref 96–106)
Creatinine, Ser: 0.65 mg/dL (ref 0.57–1.00)
GFR calc Af Amer: 112 mL/min/{1.73_m2} (ref >59)
GFR calc non Af Amer: 97 mL/min/{1.73_m2} (ref >59)
Globulin, Total: 2.6 g/dL (ref 1.5–4.5)
Glucose: 107 mg/dL — ABNORMAL HIGH (ref 65–99)
Potassium: 4.3 mmol/L (ref 3.5–5.2)
Sodium: 139 mmol/L (ref 134–144)
Total Protein: 6.9 g/dL (ref 6.0–8.5)

## 2020-03-02 LAB — VITAMIN D 25 HYDROXY (VIT D DEFICIENCY, FRACTURES): Vit D, 25-Hydroxy: 89 ng/mL (ref 30.0–100.0)

## 2020-03-02 LAB — LIPID PANEL WITH LDL/HDL RATIO
Cholesterol, Total: 210 mg/dL — ABNORMAL HIGH (ref 100–199)
HDL: 59 mg/dL (ref >39)
LDL Chol Calc (NIH): 136 mg/dL — ABNORMAL HIGH (ref 0–99)
LDL/HDL Ratio: 2.3 ratio (ref 0.0–3.2)
Triglycerides: 84 mg/dL (ref 0–149)
VLDL Cholesterol Cal: 15 mg/dL (ref 5–40)

## 2020-03-02 LAB — HEMOGLOBIN A1C
Est. average glucose Bld gHb Est-mCnc: 128 mg/dL
Hgb A1c MFr Bld: 6.1 % — ABNORMAL HIGH (ref 4.8–5.6)

## 2020-03-02 LAB — INSULIN, RANDOM: INSULIN: 15.2 u[IU]/mL (ref 2.6–24.9)

## 2020-03-24 ENCOUNTER — Ambulatory Visit (INDEPENDENT_AMBULATORY_CARE_PROVIDER_SITE_OTHER): Payer: BC Managed Care – PPO | Admitting: Physician Assistant

## 2020-03-29 ENCOUNTER — Other Ambulatory Visit: Payer: Self-pay | Admitting: Family Medicine

## 2020-03-29 MED ORDER — ALBUTEROL SULFATE HFA 108 (90 BASE) MCG/ACT IN AERS: 1.0000 | INHALATION_SPRAY | Freq: Four times a day (QID) | RESPIRATORY_TRACT | 3 refills | Status: DC | PRN

## 2020-03-30 ENCOUNTER — Encounter (INDEPENDENT_AMBULATORY_CARE_PROVIDER_SITE_OTHER): Payer: Self-pay | Admitting: Physician Assistant

## 2020-03-30 ENCOUNTER — Other Ambulatory Visit: Payer: Self-pay

## 2020-03-30 ENCOUNTER — Ambulatory Visit (INDEPENDENT_AMBULATORY_CARE_PROVIDER_SITE_OTHER): Payer: BC Managed Care – PPO | Admitting: Physician Assistant

## 2020-03-30 VITALS — BP 122/87 | HR 96 | Temp 98.6°F | Ht 64.0 in | Wt 271.0 lb

## 2020-03-30 DIAGNOSIS — E7849 Other hyperlipidemia: Secondary | ICD-10-CM | POA: Diagnosis not present

## 2020-03-30 DIAGNOSIS — E559 Vitamin D deficiency, unspecified: Secondary | ICD-10-CM | POA: Diagnosis not present

## 2020-03-30 DIAGNOSIS — Z9189 Other specified personal risk factors, not elsewhere classified: Secondary | ICD-10-CM

## 2020-03-30 DIAGNOSIS — Z6841 Body Mass Index (BMI) 40.0 and over, adult: Secondary | ICD-10-CM

## 2020-03-30 MED ORDER — ATORVASTATIN CALCIUM 20 MG PO TABS: 20.0000 mg | ORAL_TABLET | Freq: Every day | ORAL | 0 refills | Status: DC

## 2020-03-30 MED ORDER — VITAMIN D (ERGOCALCIFEROL) 1.25 MG (50000 UNIT) PO CAPS: 50000.0000 [IU] | ORAL_CAPSULE | ORAL | 0 refills | Status: DC

## 2020-03-31 NOTE — Progress Notes (Signed)
Chief Complaint:   OBESITY Pamela [name] is here to discuss her progress with her obesity treatment plan along with follow-up of her obesity related diagnoses. Pamela [name] is on keeping a food journal and adhering to recommended goals of 1200 calories and 80 grams of protein and states she is following her eating plan approximately 100% of the time. Pamela [name] states she is doing water aerobics for 60 minutes 2 times per week.  Today's visit was #: 27 Starting weight: 298 lbs Starting date: 06/05/2018 Today's weight: 271 lbs Today's date: 03/30/2020 Total lbs lost to date: 27 lbs Total lbs lost since last in-office visit: 1 lb  Interim History: Pamela [name] reports that she has been eating a lot of chicken.  She is carrying a few pounds of fluid today.  She feels good on her adjusted calories and protein goals.  Subjective:   1. Vitamin D deficiency Pamela [name]'s Vitamin D level was 89.0 on 03/01/2020. She is currently taking prescription vitamin D 50,000 IU each week. She denies nausea, vomiting or muscle weakness.  2. Other hyperlipidemia Pamela [name] has hyperlipidemia and has been trying to improve her cholesterol levels with intensive lifestyle modification including a low saturated fat diet, exercise and weight loss. She denies any chest pain, claudication or myalgias.  She is taking atorvastatin.  Lab Results  Component Value Date   ALT 15 03/01/2020   AST 15 03/01/2020   ALKPHOS 105 03/01/2020   BILITOT 0.3 03/01/2020   Lab Results  Component Value Date   CHOL 210 (H) 03/01/2020   HDL 59 03/01/2020   LDLCALC 136 (H) 03/01/2020   TRIG 84 03/01/2020   CHOLHDL 5 05/23/2018   3. At risk for heart disease Pamela [name] is at a higher than average risk for cardiovascular disease due to obesity.   Assessment/Plan:   1. Vitamin D deficiency Low Vitamin D level contributes to fatigue and are associated with obesity, breast, and colon cancer. She agrees to continue to take prescription Vitamin D @50 ,000  IU every week and will follow-up for routine testing of Vitamin D, at least 2-3 times per year to avoid over-replacement. - Vitamin D, Ergocalciferol, ([name]) 1.25 MG (50000 UNIT) CAPS capsule; Take 1 capsule (50,000 Units total) by mouth every 3 (three) days.  Dispense: 10 capsule; Refill: 0  2. Other hyperlipidemia Cardiovascular risk and specific lipid/LDL goals reviewed.  We discussed several lifestyle modifications today and Pamela [name] will continue to work on diet, exercise and weight loss efforts. Orders and follow up as documented in patient record.   Counseling Intensive lifestyle modifications are the first line treatment for this issue. . Dietary changes: Increase soluble fiber. Decrease simple carbohydrates. . Exercise changes: Moderate to vigorous-intensity aerobic activity 150 minutes per week if tolerated. . Lipid-lowering medications: see documented in medical record. - atorvastatin (LIPITOR) 20 MG tablet; Take 1 tablet (20 mg total) by mouth at bedtime.  Dispense: 30 tablet; Refill: 0  3. At risk for heart disease Pamela [name] was given approximately 15 minutes of coronary artery disease prevention counseling today. She is 59 y.o. female and has risk factors for heart disease including obesity. We discussed intensive lifestyle modifications today with an emphasis on specific weight loss instructions and strategies.   Repetitive spaced learning was employed today to elicit superior memory formation and behavioral change.  4. Class 3 severe obesity with serious comorbidity and body mass index (BMI) of 45.0 to 49.9 in adult, unspecified obesity type Pamela [name]) Pamela [name] is currently in the action stage of change. As  such, her goal is to continue with weight loss efforts. She has agreed to keeping a food journal and adhering to recommended goals of 1200 calories and 80 grams of protein daily.   Exercise goals: As is.  Behavioral modification strategies: increasing lean protein intake and meal  planning and cooking strategies.  Pamela [name] has agreed to follow-up with our clinic in 3 weeks. She was informed of the importance of frequent follow-up visits to maximize her success with intensive lifestyle modifications for her multiple health conditions.   Objective:   Blood pressure 122/87, pulse 96, temperature 98.6 F (37 C), temperature source Oral, height 5\' 4"  (1.626 m), weight 271 lb (122.9 kg), SpO2 92 %. Body mass index is 46.52 kg/m.  General: Cooperative, alert, well developed, in no acute distress. HEENT: Conjunctivae and lids unremarkable. Cardiovascular: Regular rhythm.  Lungs: Normal work of breathing. Neurologic: No focal deficits.   Lab Results  Component Value Date   CREATININE 0.65 03/01/2020   BUN 13 03/01/2020   NA 139 03/01/2020   K 4.3 03/01/2020   CL 101 03/01/2020   CO2 19 (L) 03/01/2020   Lab Results  Component Value Date   ALT 15 03/01/2020   AST 15 03/01/2020   ALKPHOS 105 03/01/2020   BILITOT 0.3 03/01/2020   Lab Results  Component Value Date   HGBA1C 6.1 (H) 03/01/2020   HGBA1C 5.8 (H) 08/11/2019   HGBA1C 5.9 (H) 02/02/2019   HGBA1C 5.9 (H) 09/25/2018   HGBA1C 6.0 (H) 06/23/2018   Lab Results  Component Value Date   INSULIN 15.2 03/01/2020   INSULIN 14.4 08/11/2019   INSULIN 13.7 02/02/2019   INSULIN 13.5 09/25/2018   INSULIN 10.9 06/05/2018   Lab Results  Component Value Date   TSH 4.450 06/05/2018   Lab Results  Component Value Date   CHOL 210 (H) 03/01/2020   HDL 59 03/01/2020   LDLCALC 136 (H) 03/01/2020   TRIG 84 03/01/2020   CHOLHDL 5 05/23/2018   Lab Results  Component Value Date   WBC 7.1 07/13/2019   HGB 15.7 (H) 07/13/2019   HCT 47.8 (H) 07/13/2019   MCV 89.7 07/13/2019   PLT 359 07/13/2019   Attestation Statements:   Reviewed by clinician on day of visit: allergies, medications, problem list, medical history, surgical history, family history, social history, and previous encounter notes.  I, Network engineer, CMA, am acting as Location manager for [employer], PA-C.  I have reviewed the above documentation for accuracy and completeness, and I agree with the above. Abby Potash, PA-C

## 2020-04-20 ENCOUNTER — Other Ambulatory Visit: Payer: Self-pay

## 2020-04-20 ENCOUNTER — Encounter (INDEPENDENT_AMBULATORY_CARE_PROVIDER_SITE_OTHER): Payer: Self-pay | Admitting: Physician Assistant

## 2020-04-20 ENCOUNTER — Ambulatory Visit (INDEPENDENT_AMBULATORY_CARE_PROVIDER_SITE_OTHER): Payer: BC Managed Care – PPO | Admitting: Physician Assistant

## 2020-04-20 VITALS — BP 116/78 | HR 101 | Temp 98.2°F | Ht 64.0 in | Wt 273.0 lb

## 2020-04-20 DIAGNOSIS — Z6841 Body Mass Index (BMI) 40.0 and over, adult: Secondary | ICD-10-CM

## 2020-04-20 DIAGNOSIS — E7849 Other hyperlipidemia: Secondary | ICD-10-CM | POA: Diagnosis not present

## 2020-04-20 DIAGNOSIS — Z9189 Other specified personal risk factors, not elsewhere classified: Secondary | ICD-10-CM | POA: Diagnosis not present

## 2020-04-20 DIAGNOSIS — E559 Vitamin D deficiency, unspecified: Secondary | ICD-10-CM | POA: Diagnosis not present

## 2020-04-20 MED ORDER — VITAMIN D (ERGOCALCIFEROL) 1.25 MG (50000 UNIT) PO CAPS: 50000.0000 [IU] | ORAL_CAPSULE | ORAL | 0 refills | Status: DC

## 2020-04-20 NOTE — Progress Notes (Signed)
Chief Complaint:   OBESITY Pamela [name] is here to discuss her progress with her obesity treatment plan along with follow-up of her obesity related diagnoses. Pamela [name] is on keeping a food journal and adhering to recommended goals of 1200 calories and 80 protein and states she is following her eating plan approximately 50 % of the time. Pamela [name] states she is exercising 60 minutes 2 times per week.  Today's visit was #: 28 Starting weight: 298 lbs Starting date: 06/05/2018 Today's weight: 273 lbs Today's date: 04/20/2020 Total lbs lost to date: 25 Total lbs lost since last in-office visit: 0  Interim History: Pamela [name] states that she has not been motivated to follow the plan over the last few weeks. Her energy is low and her mood is down.  Subjective:   Vitamin D deficiency. No nausea, vomiting, or muscle weakness on prescription Vitamin D. Last Vitamin D 89.0 on 03/01/2020.  Other hyperlipidemia. Pamela [name] is on atorvastatin. No chest pain or myalgias.  Lab Results  Component Value Date   CHOL 210 (H) 03/01/2020   HDL 59 03/01/2020   LDLCALC 136 (H) 03/01/2020   TRIG 84 03/01/2020   CHOLHDL 5 05/23/2018   Lab Results  Component Value Date   ALT 15 03/01/2020   AST 15 03/01/2020   ALKPHOS 105 03/01/2020   BILITOT 0.3 03/01/2020   The 10-year ASCVD risk score Denman George DC Jr., et al., 2013) is: 5.3%   Values used to calculate the score:     Age: 59 years     Sex: Female     Is Non-Hispanic African American: No     Diabetic: No     Tobacco smoker: Yes     Systolic Blood Pressure: 116 mmHg     Is BP treated: No     HDL Cholesterol: 59 mg/dL     Total Cholesterol: 210 mg/dL  At risk for osteoporosis. Pamela [name] is at higher risk of osteopenia and osteoporosis due to Vitamin D deficiency.   Assessment/Plan:   Vitamin D deficiency. Low Vitamin D level contributes to fatigue and are associated with obesity, breast, and colon cancer. She was given a refill on her Vitamin  D, Ergocalciferol, ([name]) 1.25 MG (50000 UNIT) CAPS capsule every 3 days #10 with 0 refills and will follow-up for routine testing of Vitamin D, at least 2-3 times per year to avoid over-replacement.   Other hyperlipidemia. Cardiovascular risk and specific lipid/LDL goals reviewed.  We discussed several lifestyle modifications today and Pamela [name] will continue to work on diet, exercise and weight loss efforts. Orders and follow up as documented in patient record. She will continue her medication as directed.   Counseling Intensive lifestyle modifications are the first line treatment for this issue. . Dietary changes: Increase soluble fiber. Decrease simple carbohydrates. . Exercise changes: Moderate to vigorous-intensity aerobic activity 150 minutes per week if tolerated. . Lipid-lowering medications: see documented in medical record.  At risk for osteoporosis. Pamela [name] was given approximately 15 minutes of osteoporosis prevention counseling today. Pamela [name] is at risk for osteopenia and osteoporosis due to her Vitamin D deficiency. She was encouraged to take her Vitamin D and follow her higher calcium diet and increase strengthening exercise to help strengthen her bones and decrease her risk of osteopenia and osteoporosis.  Repetitive spaced learning was employed today to elicit superior memory formation and behavioral change.  Class 3 severe obesity with serious comorbidity and body mass index (BMI) of 45.0 to 49.9 in adult, unspecified obesity type (  [place]).  Pamela [name] is currently in the action stage of change. As such, her goal is to continue with weight loss efforts. She has agreed to keeping a food journal and adhering to recommended goals of 1200 calories and 80 grams of protein daily.   Exercise goals: For substantial health benefits, adults should do at least 150 minutes (2 hours and 30 minutes) a week of moderate-intensity, or 75 minutes (1 hour and 15 minutes) a week of vigorous-intensity  aerobic physical activity, or an equivalent combination of moderate- and vigorous-intensity aerobic activity. Aerobic activity should be performed in episodes of at least 10 minutes, and preferably, it should be spread throughout the week.  Behavioral modification strategies: increasing lean protein intake, decreasing simple carbohydrates and no skipping meals.  Pamela [name] has agreed to follow-up with our clinic in 4 weeks. She was informed of the importance of frequent follow-up visits to maximize her success with intensive lifestyle modifications for her multiple health conditions.   Objective:   Blood pressure 116/78, pulse (!) 101, temperature 98.2 F (36.8 C), temperature source Oral, height 5\' 4"  (1.626 m), weight 273 lb (123.8 kg), SpO2 94 %. Body mass index is 46.86 kg/m.  General: Cooperative, alert, well developed, in no acute distress. HEENT: Conjunctivae and lids unremarkable. Cardiovascular: Regular rhythm.  Lungs: Normal work of breathing. Neurologic: No focal deficits.   Lab Results  Component Value Date   CREATININE 0.65 03/01/2020   BUN 13 03/01/2020   NA 139 03/01/2020   K 4.3 03/01/2020   CL 101 03/01/2020   CO2 19 (L) 03/01/2020   Lab Results  Component Value Date   ALT 15 03/01/2020   AST 15 03/01/2020   ALKPHOS 105 03/01/2020   BILITOT 0.3 03/01/2020   Lab Results  Component Value Date   HGBA1C 6.1 (H) 03/01/2020   HGBA1C 5.8 (H) 08/11/2019   HGBA1C 5.9 (H) 02/02/2019   HGBA1C 5.9 (H) 09/25/2018   HGBA1C 6.0 (H) 06/23/2018   Lab Results  Component Value Date   INSULIN 15.2 03/01/2020   INSULIN 14.4 08/11/2019   INSULIN 13.7 02/02/2019   INSULIN 13.5 09/25/2018   INSULIN 10.9 06/05/2018   Lab Results  Component Value Date   TSH 4.450 06/05/2018   Lab Results  Component Value Date   CHOL 210 (H) 03/01/2020   HDL 59 03/01/2020   LDLCALC 136 (H) 03/01/2020   TRIG 84 03/01/2020   CHOLHDL 5 05/23/2018   Lab Results  Component Value Date    WBC 7.1 07/13/2019   HGB 15.7 (H) 07/13/2019   HCT 47.8 (H) 07/13/2019   MCV 89.7 07/13/2019   PLT 359 07/13/2019   No results found for: IRON, TIBC, FERRITIN  Attestation Statements:   Reviewed by clinician on day of visit: allergies, medications, problem list, medical history, surgical history, family history, social history, and previous encounter notes.  IMichaelene Song, am acting as transcriptionist for Abby Potash, PA-C   I have reviewed the above documentation for accuracy and completeness, and I agree with the above. Abby Potash, PA-C

## 2020-05-26 ENCOUNTER — Ambulatory Visit (INDEPENDENT_AMBULATORY_CARE_PROVIDER_SITE_OTHER): Payer: BC Managed Care – PPO | Admitting: Physician Assistant

## 2020-06-15 ENCOUNTER — Encounter (INDEPENDENT_AMBULATORY_CARE_PROVIDER_SITE_OTHER): Payer: Self-pay | Admitting: Physician Assistant

## 2020-06-15 ENCOUNTER — Other Ambulatory Visit: Payer: Self-pay

## 2020-06-15 ENCOUNTER — Ambulatory Visit (INDEPENDENT_AMBULATORY_CARE_PROVIDER_SITE_OTHER): Payer: BC Managed Care – PPO | Admitting: Physician Assistant

## 2020-06-15 VITALS — BP 118/83 | HR 80 | Temp 98.6°F | Ht 64.0 in | Wt 272.0 lb

## 2020-06-15 DIAGNOSIS — E559 Vitamin D deficiency, unspecified: Secondary | ICD-10-CM | POA: Diagnosis not present

## 2020-06-15 DIAGNOSIS — E7849 Other hyperlipidemia: Secondary | ICD-10-CM | POA: Diagnosis not present

## 2020-06-15 DIAGNOSIS — Z6841 Body Mass Index (BMI) 40.0 and over, adult: Secondary | ICD-10-CM

## 2020-06-16 NOTE — Progress Notes (Signed)
Chief Complaint:   OBESITY Pamela [name] is here to discuss her progress with her obesity treatment plan along with follow-up of her obesity related diagnoses. Pamela [name] is keeping a food journal and adhering to recommended goals of 1200 calories and 80 grams of protein and states she is following her eating plan approximately 100% of the time. Pamela [name] states she is doing water aerobics 60 minutes 2 times per week.  Today's visit was #: 29 Starting weight: 298 lbs Starting date: 06/05/2018 Today's weight: 272 lbs Today's date: 06/15/2020 Total lbs lost to date: 26 Total lbs lost since last in-office visit: 1  Interim History: Pamela [name] reports that in June and the beginning of July she was very stressed with work and did not meet her protein goals.  Subjective:   Other hyperlipidemia. Pamela [name] is on Lipitor, which she is tolerating well.   Lab Results  Component Value Date   CHOL 210 (H) 03/01/2020   HDL 59 03/01/2020   LDLCALC 136 (H) 03/01/2020   TRIG 84 03/01/2020   CHOLHDL 5 05/23/2018   Lab Results  Component Value Date   ALT 15 03/01/2020   AST 15 03/01/2020   ALKPHOS 105 03/01/2020   BILITOT 0.3 03/01/2020   The 10-year ASCVD risk score Denman George DC Jr., et al., 2013) is: 5.4%   Values used to calculate the score:     Age: 59 years     Sex: Female     Is Non-Hispanic African American: No     Diabetic: No     Tobacco smoker: Yes     Systolic Blood Pressure: 118 mmHg     Is BP treated: No     HDL Cholesterol: 59 mg/dL     Total Cholesterol: 210 mg/dL  Vitamin D deficiency. Pamela [name] is on Vitamin D twice weekly. No nausea, vomiting, or muscle weakness.    Ref. Range 03/01/2020 10:30  Vitamin D, 25-Hydroxy Latest Ref Range: 30.0 - 100.0 ng/mL 89.0   Assessment/Plan:   Other hyperlipidemia. Cardiovascular risk and specific lipid/LDL goals reviewed.  We discussed several lifestyle modifications today and Pamela [name] will continue to work on diet, exercise and weight  loss efforts. Orders and follow up as documented in patient record. She will continue her medication as directed.   Counseling Intensive lifestyle modifications are the first line treatment for this issue.  Dietary changes: Increase soluble fiber. Decrease simple carbohydrates.  Exercise changes: Moderate to vigorous-intensity aerobic activity 150 minutes per week if tolerated.  Lipid-lowering medications: see documented in medical record.  Vitamin D deficiency. Low Vitamin D level contributes to fatigue and are associated with obesity, breast, and colon cancer. She agrees to continue to take Vitamin D as directed and will follow-up for routine testing of Vitamin D, at least 2-3 times per year to avoid over-replacement.  Class 3 severe obesity with serious comorbidity and body mass index (BMI) of 45.0 to 49.9 in adult, unspecified obesity type (HCC).  Pamela [name] is currently in the action stage of change. As such, her goal is to continue with weight loss efforts. She has agreed to keeping a food journal and adhering to recommended goals of 1200 calories and 80 grams of protein daily.   Exercise goals: For substantial health benefits, adults should do at least 150 minutes (2 hours and 30 minutes) a week of moderate-intensity, or 75 minutes (1 hour and 15 minutes) a week of vigorous-intensity aerobic physical activity, or an equivalent combination of moderate- and vigorous-intensity aerobic activity. Aerobic  activity should be performed in episodes of at least 10 minutes, and preferably, it should be spread throughout the week.  Behavioral modification strategies: meal planning and cooking strategies and keeping healthy foods in the home.  Pamela [name] has agreed to follow-up with our clinic in 3 weeks for labs. She was informed of the importance of frequent follow-up visits to maximize her success with intensive lifestyle modifications for her multiple health conditions.   Objective:   Blood pressure  118/83, pulse 80, temperature 98.6 F (37 C), temperature source Oral, height 5\' 4"  (1.626 m), weight 272 lb (123.4 kg), SpO2 94 %. Body mass index is 46.69 kg/m.  General: Cooperative, alert, well developed, in no acute distress. HEENT: Conjunctivae and lids unremarkable. Cardiovascular: Regular rhythm.  Lungs: Normal work of breathing. Neurologic: No focal deficits.   Lab Results  Component Value Date   CREATININE 0.65 03/01/2020   BUN 13 03/01/2020   NA 139 03/01/2020   K 4.3 03/01/2020   CL 101 03/01/2020   CO2 19 (L) 03/01/2020   Lab Results  Component Value Date   ALT 15 03/01/2020   AST 15 03/01/2020   ALKPHOS 105 03/01/2020   BILITOT 0.3 03/01/2020   Lab Results  Component Value Date   HGBA1C 6.1 (H) 03/01/2020   HGBA1C 5.8 (H) 08/11/2019   HGBA1C 5.9 (H) 02/02/2019   HGBA1C 5.9 (H) 09/25/2018   HGBA1C 6.0 (H) 06/23/2018   Lab Results  Component Value Date   INSULIN 15.2 03/01/2020   INSULIN 14.4 08/11/2019   INSULIN 13.7 02/02/2019   INSULIN 13.5 09/25/2018   INSULIN 10.9 06/05/2018   Lab Results  Component Value Date   TSH 4.450 06/05/2018   Lab Results  Component Value Date   CHOL 210 (H) 03/01/2020   HDL 59 03/01/2020   LDLCALC 136 (H) 03/01/2020   TRIG 84 03/01/2020   CHOLHDL 5 05/23/2018   Lab Results  Component Value Date   WBC 7.1 07/13/2019   HGB 15.7 (H) 07/13/2019   HCT 47.8 (H) 07/13/2019   MCV 89.7 07/13/2019   PLT 359 07/13/2019   No results found for: IRON, TIBC, FERRITIN  Attestation Statements:   Reviewed by clinician on day of visit: allergies, medications, problem list, medical history, surgical history, family history, social history, and previous encounter notes.  Time spent on visit including pre-visit chart review and post-visit charting and care was 35 minutes.   I08/19/2020, am acting as transcriptionist for Marianna Payment, PA-C   I have reviewed the above documentation for accuracy and completeness, and I  agree with the above. Alois Cliche, PA-C

## 2020-06-28 ENCOUNTER — Ambulatory Visit (INDEPENDENT_AMBULATORY_CARE_PROVIDER_SITE_OTHER): Payer: BC Managed Care – PPO | Admitting: Physician Assistant

## 2020-07-07 ENCOUNTER — Ambulatory Visit (INDEPENDENT_AMBULATORY_CARE_PROVIDER_SITE_OTHER): Payer: BC Managed Care – PPO | Admitting: Physician Assistant

## 2020-07-07 ENCOUNTER — Encounter (INDEPENDENT_AMBULATORY_CARE_PROVIDER_SITE_OTHER): Payer: Self-pay | Admitting: Physician Assistant

## 2020-07-07 ENCOUNTER — Other Ambulatory Visit: Payer: Self-pay

## 2020-07-07 VITALS — BP 139/96 | HR 95 | Temp 97.0°F | Ht 64.0 in | Wt 272.0 lb

## 2020-07-07 DIAGNOSIS — E7849 Other hyperlipidemia: Secondary | ICD-10-CM

## 2020-07-07 DIAGNOSIS — E559 Vitamin D deficiency, unspecified: Secondary | ICD-10-CM

## 2020-07-07 DIAGNOSIS — Z9189 Other specified personal risk factors, not elsewhere classified: Secondary | ICD-10-CM | POA: Diagnosis not present

## 2020-07-07 DIAGNOSIS — R7303 Prediabetes: Secondary | ICD-10-CM | POA: Diagnosis not present

## 2020-07-07 DIAGNOSIS — Z6841 Body Mass Index (BMI) 40.0 and over, adult: Secondary | ICD-10-CM

## 2020-07-08 LAB — LIPID PANEL
Chol/HDL Ratio: 3.3 ratio (ref 0.0–4.4)
Cholesterol, Total: 184 mg/dL (ref 100–199)
HDL: 55 mg/dL (ref >39)
LDL Chol Calc (NIH): 109 mg/dL — ABNORMAL HIGH (ref 0–99)
Triglycerides: 109 mg/dL (ref 0–149)
VLDL Cholesterol Cal: 20 mg/dL (ref 5–40)

## 2020-07-08 LAB — COMPREHENSIVE METABOLIC PANEL
ALT: 18 IU/L (ref 0–32)
AST: 18 IU/L (ref 0–40)
Albumin/Globulin Ratio: 1.6 (ref 1.2–2.2)
Albumin: 4.2 g/dL (ref 3.8–4.9)
Alkaline Phosphatase: 99 IU/L (ref 48–121)
BUN/Creatinine Ratio: 14 (ref 12–28)
BUN: 9 mg/dL (ref 8–27)
Bilirubin Total: 0.4 mg/dL (ref 0.0–1.2)
CO2: 25 mmol/L (ref 20–29)
Calcium: 9.3 mg/dL (ref 8.7–10.3)
Chloride: 102 mmol/L (ref 96–106)
Creatinine, Ser: 0.63 mg/dL (ref 0.57–1.00)
GFR calc Af Amer: 113 mL/min/{1.73_m2} (ref >59)
GFR calc non Af Amer: 98 mL/min/{1.73_m2} (ref >59)
Globulin, Total: 2.6 g/dL (ref 1.5–4.5)
Glucose: 106 mg/dL — ABNORMAL HIGH (ref 65–99)
Potassium: 4.5 mmol/L (ref 3.5–5.2)
Sodium: 139 mmol/L (ref 134–144)
Total Protein: 6.8 g/dL (ref 6.0–8.5)

## 2020-07-08 LAB — HEMOGLOBIN A1C
Est. average glucose Bld gHb Est-mCnc: 128 mg/dL
Hgb A1c MFr Bld: 6.1 % — ABNORMAL HIGH (ref 4.8–5.6)

## 2020-07-08 LAB — VITAMIN D 25 HYDROXY (VIT D DEFICIENCY, FRACTURES): Vit D, 25-Hydroxy: 79.1 ng/mL (ref 30.0–100.0)

## 2020-07-08 LAB — INSULIN, RANDOM: INSULIN: 10.5 u[IU]/mL (ref 2.6–24.9)

## 2020-07-11 NOTE — Progress Notes (Signed)
Chief Complaint:   OBESITY Pamela [name] is here to discuss her progress with her obesity treatment plan along with follow-up of her obesity related diagnoses. Pamela [name] is keeping a food journal and adhering to recommended goals of 1200 calories and 80 grams of protein and states she is following her eating plan approximately 100% of the time. Pamela [name] states she is doing water aerobics 60 minutes 2 times per week.  Today's visit was #: 30 Starting weight: 298 lbs Starting date: 06/05/2018 Today's weight: 272 lbs Today's date: 07/07/2020 Total lbs lost to date: 26 Total lbs lost since last in-office visit: 0  Interim History: Pamela [name] brings in her printed journal today. She is not meeting her protein goal or calorie goal 2/3 of the time.  Subjective:   Other hyperlipidemia. Pamela [name] is on Lipitor. No chest pain or myalgias.    Lab Results  Component Value Date   CHOL 184 07/07/2020   HDL 55 07/07/2020   LDLCALC 109 (H) 07/07/2020   TRIG 109 07/07/2020   CHOLHDL 3.3 07/07/2020   Lab Results  Component Value Date   ALT 18 07/07/2020   AST 18 07/07/2020   ALKPHOS 99 07/07/2020   BILITOT 0.4 07/07/2020   The 10-year ASCVD risk score Denman George DC Jr., et al., 2013) is: 7.6%   Values used to calculate the score:     Age: 59 years     Sex: Female     Is Non-Hispanic African American: No     Diabetic: No     Tobacco smoker: Yes     Systolic Blood Pressure: 139 mmHg     Is BP treated: No     HDL Cholesterol: 55 mg/dL     Total Cholesterol: 184 mg/dL  Prediabetes. Pamela [name] has a diagnosis of prediabetes based on her elevated HgA1c and was informed this puts her at greater risk of developing diabetes. She continues to work on diet and exercise to decrease her risk of diabetes. She denies nausea or hypoglycemia. Pamela [name] is on no medication. No polyphagia.  Lab Results  Component Value Date   HGBA1C 6.1 (H) 07/07/2020   Lab Results  Component Value Date   INSULIN 10.5  07/07/2020   INSULIN 15.2 03/01/2020   INSULIN 14.4 08/11/2019   INSULIN 13.7 02/02/2019   INSULIN 13.5 09/25/2018   Vitamin D deficiency. Pamela [name] is on Vitamin D twice weekly.   Ref. Range 03/01/2020 10:30  Vitamin D, 25-Hydroxy Latest Ref Range: 30.0 - 100.0 ng/mL 89.0   At risk for diabetes mellitus. Pamela [name] is at higher than average risk for developing diabetes due to her obesity.   Assessment/Plan:   Other hyperlipidemia. Cardiovascular risk and specific lipid/LDL goals reviewed.  We discussed several lifestyle modifications today and Pamela [name] will continue to work on diet, exercise and weight loss efforts. Orders and follow up as documented in patient record. She will continue her medication as directed. Lipid panel will be checked today.  Counseling Intensive lifestyle modifications are the first line treatment for this issue. . Dietary changes: Increase soluble fiber. Decrease simple carbohydrates. . Exercise changes: Moderate to vigorous-intensity aerobic activity 150 minutes per week if tolerated. . Lipid-lowering medications: see documented in medical record.   Prediabetes. Pamela [name] will continue to work on weight loss, exercise, and decreasing simple carbohydrates to help decrease the risk of diabetes. Comprehensive metabolic panel, Hemoglobin A1c, Insulin, random labs will be checked today.   Vitamin D deficiency. Low Vitamin D level contributes to fatigue and are  associated with obesity, breast, and colon cancer. She agrees to continue to take Vitamin D as directed and VITAMIN D 25 Hydroxy (Vit-D Deficiency, Fractures) level will be checked today.  At risk for diabetes mellitus. Pamela [name] was given approximately 15 minutes of diabetes education and counseling today. We discussed intensive lifestyle modifications today with an emphasis on weight loss as well as increasing exercise and decreasing simple carbohydrates in her diet. We also reviewed medication options with an emphasis  on risk versus benefit of those discussed.   Repetitive spaced learning was employed today to elicit superior memory formation and behavioral change.  Class 3 severe obesity with serious comorbidity and body mass index (BMI) of 45.0 to 49.9 in adult, unspecified obesity type (HCC).  [name] is currently in the action stage of change. As such, her goal is to continue with weight loss efforts. She has agreed to keeping a food journal and adhering to recommended goals of 1200 calories and 80 grams of protein daily.   Exercise goals: For substantial health benefits, adults should do at least 150 minutes (2 hours and 30 minutes) a week of moderate-intensity, or 75 minutes (1 hour and 15 minutes) a week of vigorous-intensity aerobic physical activity, or an equivalent combination of moderate- and vigorous-intensity aerobic activity. Aerobic activity should be performed in episodes of at least 10 minutes, and preferably, it should be spread throughout the week.  Behavioral modification strategies: meal planning and cooking strategies and keeping healthy foods in the home.  Pamela [name] has agreed to follow-up with our clinic in 3 weeks. She was informed of the importance of frequent follow-up visits to maximize her success with intensive lifestyle modifications for her multiple health conditions.   Pamela [name] was informed we would discuss her lab results at her next visit unless there is a critical issue that needs to be addressed sooner. Pamela [name] agreed to keep her next visit at the agreed upon time to discuss these results.  Objective:   Blood pressure (!) 139/96, pulse 95, temperature (!) 97 F (36.1 C), temperature source Oral, height 5\' 4"  (1.626 m), weight 272 lb (123.4 kg), SpO2 95 %. Body mass index is 46.69 kg/m.  General: Cooperative, alert, well developed, in no acute distress. HEENT: Conjunctivae and lids unremarkable. Cardiovascular: Regular rhythm.  Lungs: Normal work of  breathing. Neurologic: No focal deficits.   Lab Results  Component Value Date   CREATININE 0.63 07/07/2020   BUN 9 07/07/2020   NA 139 07/07/2020   K 4.5 07/07/2020   CL 102 07/07/2020   CO2 25 07/07/2020   Lab Results  Component Value Date   ALT 18 07/07/2020   AST 18 07/07/2020   ALKPHOS 99 07/07/2020   BILITOT 0.4 07/07/2020   Lab Results  Component Value Date   HGBA1C 6.1 (H) 07/07/2020   HGBA1C 6.1 (H) 03/01/2020   HGBA1C 5.8 (H) 08/11/2019   HGBA1C 5.9 (H) 02/02/2019   HGBA1C 5.9 (H) 09/25/2018   Lab Results  Component Value Date   INSULIN 10.5 07/07/2020   INSULIN 15.2 03/01/2020   INSULIN 14.4 08/11/2019   INSULIN 13.7 02/02/2019   INSULIN 13.5 09/25/2018   Lab Results  Component Value Date   TSH 4.450 06/05/2018   Lab Results  Component Value Date   CHOL 184 07/07/2020   HDL 55 07/07/2020   LDLCALC 109 (H) 07/07/2020   TRIG 109 07/07/2020   CHOLHDL 3.3 07/07/2020   Lab Results  Component Value Date   WBC 7.1 07/13/2019  HGB 15.7 (H) 07/13/2019   HCT 47.8 (H) 07/13/2019   MCV 89.7 07/13/2019   PLT 359 07/13/2019   No results found for: IRON, TIBC, FERRITIN  Attestation Statements:   Reviewed by clinician on day of visit: allergies, medications, problem list, medical history, surgical history, family history, social history, and previous encounter notes.  IMarianna Payment, am acting as transcriptionist for Alois Cliche, PA-C   I have reviewed the above documentation for accuracy and completeness, and I agree with the above. Alois Cliche, PA-C

## 2020-07-16 ENCOUNTER — Other Ambulatory Visit: Payer: Self-pay | Admitting: Family Medicine

## 2020-07-16 DIAGNOSIS — B354 Tinea corporis: Secondary | ICD-10-CM

## 2020-07-17 ENCOUNTER — Other Ambulatory Visit: Payer: Self-pay

## 2020-07-17 DIAGNOSIS — B354 Tinea corporis: Secondary | ICD-10-CM

## 2020-08-03 ENCOUNTER — Other Ambulatory Visit: Payer: Self-pay

## 2020-08-03 ENCOUNTER — Ambulatory Visit (INDEPENDENT_AMBULATORY_CARE_PROVIDER_SITE_OTHER): Payer: BC Managed Care – PPO | Admitting: Physician Assistant

## 2020-08-03 ENCOUNTER — Encounter (INDEPENDENT_AMBULATORY_CARE_PROVIDER_SITE_OTHER): Payer: Self-pay | Admitting: Physician Assistant

## 2020-08-03 VITALS — BP 154/81 | HR 73 | Temp 98.0°F | Ht 64.0 in | Wt 274.0 lb

## 2020-08-03 DIAGNOSIS — E7849 Other hyperlipidemia: Secondary | ICD-10-CM

## 2020-08-03 DIAGNOSIS — E559 Vitamin D deficiency, unspecified: Secondary | ICD-10-CM | POA: Diagnosis not present

## 2020-08-03 DIAGNOSIS — Z9189 Other specified personal risk factors, not elsewhere classified: Secondary | ICD-10-CM | POA: Diagnosis not present

## 2020-08-03 DIAGNOSIS — Z6841 Body Mass Index (BMI) 40.0 and over, adult: Secondary | ICD-10-CM

## 2020-08-03 MED ORDER — VITAMIN D 125 MCG (5000 UT) PO CAPS: 1.0000 | ORAL_CAPSULE | Freq: Every day | ORAL | 0 refills | Status: DC

## 2020-08-03 MED ORDER — ATORVASTATIN CALCIUM 20 MG PO TABS: 20.0000 mg | ORAL_TABLET | Freq: Every day | ORAL | 0 refills | Status: DC

## 2020-08-04 ENCOUNTER — Other Ambulatory Visit (HOSPITAL_BASED_OUTPATIENT_CLINIC_OR_DEPARTMENT_OTHER): Payer: Self-pay | Admitting: Family Medicine

## 2020-08-04 DIAGNOSIS — Z1231 Encounter for screening mammogram for malignant neoplasm of breast: Secondary | ICD-10-CM

## 2020-08-04 NOTE — Progress Notes (Signed)
Chief Complaint:   OBESITY Pamela [name] is here to discuss her progress with her obesity treatment plan along with follow-up of her obesity related diagnoses. Pamela [name] is on keeping a food journal and adhering to recommended goals of 1200 calories and 80 grams of protein and states she is following her eating plan approximately 100% of the time. Pamela [name] states she is exercising for 0 minutes 0 times per week.  Today's visit was #: 31 Starting weight: 298 lbs Starting date: 06/05/2018 Today's weight: 274 lbs Today's date: 08/03/2020 Total lbs lost to date: 24 lbs Total lbs lost since last in-office visit: 0  Interim History: Pamela [name] says she has been eating a lot of fruit recently. She is not meeting her calorie goal or protein goal every day, but has done a better job overall.  Subjective:   1. Other hyperlipidemia Pamela [name] has hyperlipidemia and has been trying to improve her cholesterol levels with intensive lifestyle modification including a low saturated fat diet, exercise and weight loss. She denies any chest pain, claudication or myalgias.  She is on atorvastatin and tolerating it well.  Lab Results  Component Value Date   ALT 18 07/07/2020   AST 18 07/07/2020   ALKPHOS 99 07/07/2020   BILITOT 0.4 07/07/2020   Lab Results  Component Value Date   CHOL 184 07/07/2020   HDL 55 07/07/2020   LDLCALC 109 (H) 07/07/2020   TRIG 109 07/07/2020   CHOLHDL 3.3 07/07/2020   2. Vitamin D deficiency Pamela [name]'s Vitamin D level was 79.1 on 07/07/2020. She is currently taking no vitamin D supplement. She denies nausea, vomiting or muscle weakness.  Her level has decreased since her last labs.  3. At risk for heart disease Pamela [name] is at a higher than average risk for cardiovascular disease due to obesity.   Assessment/Plan:   1. Other hyperlipidemia Cardiovascular risk and specific lipid/LDL goals reviewed.  We discussed several lifestyle modifications today and Pamela [name] will continue to  work on diet, exercise and weight loss efforts. Orders and follow up as documented in patient record.   Counseling Intensive lifestyle modifications are the first line treatment for this issue.  Dietary changes: Increase soluble fiber. Decrease simple carbohydrates.  Exercise changes: Moderate to vigorous-intensity aerobic activity 150 minutes per week if tolerated.  Lipid-lowering medications: see documented in medical record.  -Refill atorvastatin (LIPITOR) 20 MG tablet; Take 1 tablet (20 mg total) by mouth at bedtime.  Dispense: 30 tablet; Refill: 0  2. Vitamin D deficiency Low Vitamin D level contributes to fatigue and are associated with obesity, breast, and colon cancer. She agrees to start to take Vitamin D @5 ,000 IU daily and will follow-up for routine testing of Vitamin D, at least 2-3 times per year to avoid over-replacement.  -Start Cholecalciferol (VITAMIN D) 125 MCG (5000 UT) CAPS; Take 1 capsule by mouth daily.  Dispense: 30 capsule; Refill: 0  3. At risk for heart disease Pamela [name] was given approximately 15 minutes of coronary artery disease prevention counseling today. She is 59 y.o. female and has risk factors for heart disease including obesity. We discussed intensive lifestyle modifications today with an emphasis on specific weight loss instructions and strategies.   Repetitive spaced learning was employed today to elicit superior memory formation and behavioral change.  4. Class 3 severe obesity with serious comorbidity and body mass index (BMI) of 45.0 to 49.9 in adult, unspecified obesity type Pamela [name]) Pamela [name] is currently in the action stage of change. As such, her goal is  to continue with weight loss efforts. She has agreed to keeping a food journal and adhering to recommended goals of 1200-1300 calories and 80 grams of protein.   Exercise goals: For substantial health benefits, adults should do at least 150 minutes (2 hours and 30 minutes) a week of moderate-intensity,  or 75 minutes (1 hour and 15 minutes) a week of vigorous-intensity aerobic physical activity, or an equivalent combination of moderate- and vigorous-intensity aerobic activity. Aerobic activity should be performed in episodes of at least 10 minutes, and preferably, it should be spread throughout the week.  Behavioral modification strategies: decreasing simple carbohydrates, meal planning and cooking strategies and keeping healthy foods in the home.  Decrease simple carbs.  Pamela [name] has agreed to follow-up with our clinic in 3 weeks. She was informed of the importance of frequent follow-up visits to maximize her success with intensive lifestyle modifications for her multiple health conditions.   Objective:   Blood pressure (!) 154/81, pulse 73, temperature 98 F (36.7 C), temperature source Oral, height 5\' 4"  (1.626 m), weight 274 lb (124.3 kg), SpO2 94 %. Body mass index is 47.03 kg/m.  General: Cooperative, alert, well developed, in no acute distress. HEENT: Conjunctivae and lids unremarkable. Cardiovascular: Regular rhythm.  Lungs: Normal work of breathing. Neurologic: No focal deficits.   Lab Results  Component Value Date   CREATININE 0.63 07/07/2020   BUN 9 07/07/2020   NA 139 07/07/2020   K 4.5 07/07/2020   CL 102 07/07/2020   CO2 25 07/07/2020   Lab Results  Component Value Date   ALT 18 07/07/2020   AST 18 07/07/2020   ALKPHOS 99 07/07/2020   BILITOT 0.4 07/07/2020   Lab Results  Component Value Date   HGBA1C 6.1 (H) 07/07/2020   HGBA1C 6.1 (H) 03/01/2020   HGBA1C 5.8 (H) 08/11/2019   HGBA1C 5.9 (H) 02/02/2019   HGBA1C 5.9 (H) 09/25/2018   Lab Results  Component Value Date   INSULIN 10.5 07/07/2020   INSULIN 15.2 03/01/2020   INSULIN 14.4 08/11/2019   INSULIN 13.7 02/02/2019   INSULIN 13.5 09/25/2018   Lab Results  Component Value Date   TSH 4.450 06/05/2018   Lab Results  Component Value Date   CHOL 184 07/07/2020   HDL 55 07/07/2020   LDLCALC 109  (H) 07/07/2020   TRIG 109 07/07/2020   CHOLHDL 3.3 07/07/2020   Lab Results  Component Value Date   WBC 7.1 07/13/2019   HGB 15.7 (H) 07/13/2019   HCT 47.8 (H) 07/13/2019   MCV 89.7 07/13/2019   PLT 359 07/13/2019   Attestation Statements:   Reviewed by clinician on day of visit: allergies, medications, problem list, medical history, surgical history, family history, social history, and previous encounter notes.  I, 07/15/2019, CMA, am acting as transcriptionist for Insurance claims handler, PA-C  I have reviewed the above documentation for accuracy and completeness, and I agree with the above. Alois Cliche, PA-C

## 2020-08-29 ENCOUNTER — Ambulatory Visit (INDEPENDENT_AMBULATORY_CARE_PROVIDER_SITE_OTHER): Payer: BC Managed Care – PPO | Admitting: Physician Assistant

## 2020-09-01 ENCOUNTER — Encounter (INDEPENDENT_AMBULATORY_CARE_PROVIDER_SITE_OTHER): Payer: Self-pay

## 2020-09-05 ENCOUNTER — Ambulatory Visit (HOSPITAL_BASED_OUTPATIENT_CLINIC_OR_DEPARTMENT_OTHER): Payer: BC Managed Care – PPO

## 2020-09-05 ENCOUNTER — Ambulatory Visit (INDEPENDENT_AMBULATORY_CARE_PROVIDER_SITE_OTHER): Payer: BC Managed Care – PPO | Admitting: Physician Assistant

## 2020-09-08 ENCOUNTER — Encounter (INDEPENDENT_AMBULATORY_CARE_PROVIDER_SITE_OTHER): Payer: Self-pay | Admitting: Physician Assistant

## 2020-09-08 ENCOUNTER — Other Ambulatory Visit: Payer: Self-pay

## 2020-09-08 ENCOUNTER — Ambulatory Visit (INDEPENDENT_AMBULATORY_CARE_PROVIDER_SITE_OTHER): Payer: BC Managed Care – PPO | Admitting: Physician Assistant

## 2020-09-08 VITALS — BP 119/86 | HR 94 | Temp 98.2°F | Ht 64.0 in | Wt 271.0 lb

## 2020-09-08 DIAGNOSIS — E038 Other specified hypothyroidism: Secondary | ICD-10-CM

## 2020-09-08 DIAGNOSIS — Z6841 Body Mass Index (BMI) 40.0 and over, adult: Secondary | ICD-10-CM

## 2020-09-08 DIAGNOSIS — E7849 Other hyperlipidemia: Secondary | ICD-10-CM

## 2020-09-08 DIAGNOSIS — Z9189 Other specified personal risk factors, not elsewhere classified: Secondary | ICD-10-CM

## 2020-09-08 MED ORDER — ATORVASTATIN CALCIUM 20 MG PO TABS: 20.0000 mg | ORAL_TABLET | Freq: Every day | ORAL | 0 refills | Status: DC

## 2020-09-12 NOTE — Progress Notes (Signed)
Chief Complaint:   OBESITY Pamela [name] is here to discuss her progress with her obesity treatment plan along with follow-up of her obesity related diagnoses. Infiniti is keeping a food journal and adhering to recommended goals of 1200 calories and 85 grams of protein and states she is following her eating plan approximately 100% of the time. [name] states she is swimming 30 minutes 3 times per week.  Today's visit was #: 32 Starting weight: 298 lbs Starting date: 06/05/2018 Today's weight: 271 lbs Today's date: 09/08/2020 Total lbs lost to date: 27 Total lbs lost since last in-office visit: 3  Interim History: [name] states that she is also keeping track of carbs in addition to calories and protein. She is attempting to keep her carb intake to less than 75 grams daily, but is struggling with that.  Subjective:   Other hyperlipidemia. [name] is on atorvastatin, which she is tolerating well.   Lab Results  Component Value Date   CHOL 184 07/07/2020   HDL 55 07/07/2020   LDLCALC 109 (H) 07/07/2020   TRIG 109 07/07/2020   CHOLHDL 3.3 07/07/2020   Lab Results  Component Value Date   ALT 18 07/07/2020   AST 18 07/07/2020   ALKPHOS 99 07/07/2020   BILITOT 0.4 07/07/2020   The 10-year ASCVD risk score Denman George DC Jr., et al., 2013) is: 5.6%   Values used to calculate the score:     Age: 59 years     Sex: Female     Is Non-Hispanic African American: No     Diabetic: No     Tobacco smoker: Yes     Systolic Blood Pressure: 119 mmHg     Is BP treated: No     HDL Cholesterol: 55 mg/dL     Total Cholesterol: 184 mg/dL  Other specified hypothyroidism. [name] is on levothyroxine. No heat/cold intolerance.   Lab Results  Component Value Date   TSH 4.450 06/05/2018   At increased risk of exposure to COVID-19 virus. The patient is at higher risk of COVID-19 infection due to higher infection rates locally.  Assessment/Plan:   Other hyperlipidemia. Cardiovascular  risk and specific lipid/LDL goals reviewed.  We discussed several lifestyle modifications today and [name] will continue to work on diet, exercise and weight loss efforts. Orders and follow up as documented in patient record. Refill was given for atorvastatin (LIPITOR) 20 MG tablet #90 with 0 refills.  Counseling Intensive lifestyle modifications are the first line treatment for this issue. . Dietary changes: Increase soluble fiber. Decrease simple carbohydrates. . Exercise changes: Moderate to vigorous-intensity aerobic activity 150 minutes per week if tolerated. . Lipid-lowering medications: see documented in medical record.   Other specified hypothyroidism. Patient with long-standing hypothyroidism, on levothyroxine therapy. She appears euthyroid. Orders and follow up as documented in patient record. [name] will continue her medication as directed.   Counseling . Good thyroid control is important for overall health. Supratherapeutic thyroid levels are dangerous and will not improve weight loss results. . The correct way to take levothyroxine is fasting, with water, separated by at least 30 minutes from breakfast, and separated by more than 4 hours from calcium, iron, multivitamins, acid reflux medications (PPIs).   At increased risk of exposure to COVID-19 virus. [name] was given approximately 15 minutes of COVID prevention counseling today Counseling  COVID-19 is a respiratory infection that is caused by a virus. It can cause serious infections, such as pneumonia, acute respiratory distress syndrome, acute respiratory failure,  or sepsis.  You are more likely to develop a serious illness if you are 61 years of age or older, have a weak immune system, live in a nursing home, have chronic disease, or have obesity.  Get vaccinated as soon as they are available to you.  For our most current information, please visit [email or web address]  Wash your hands often with soap and water  for 20 seconds. If soap and water are not available, use alcohol-based hand sanitizer.  Wear a face mask. Make sure your mask covers your nose and mouth.  Maintain at least 6 feet distance from others when in public.  Get help right away if  You have trouble breathing, chest pain, confusion, or other concerning symptoms.  Repetitive spaced learning was employed today to elicit superior memory formation and behavioral change.  Class 3 severe obesity with serious comorbidity and body mass index (BMI) of 45.0 to 49.9 in adult, unspecified obesity type (HCC).  [name] is currently in the action stage of change. As such, her goal is to continue with weight loss efforts. She has agreed to keeping a food journal and adhering to recommended goals of 1200 calories and 85 grams of protein daily. She will keep carbs less than 100 grams daily.  Exercise goals: For substantial health benefits, adults should do at least 150 minutes (2 hours and 30 minutes) a week of moderate-intensity, or 75 minutes (1 hour and 15 minutes) a week of vigorous-intensity aerobic physical activity, or an equivalent combination of moderate- and vigorous-intensity aerobic activity. Aerobic activity should be performed in episodes of at least 10 minutes, and preferably, it should be spread throughout the week.  Behavioral modification strategies: meal planning and cooking strategies and keeping healthy foods in the home.  [name] has agreed to follow-up with our clinic in 3-4 weeks. She was informed of the importance of frequent follow-up visits to maximize her success with intensive lifestyle modifications for her multiple health conditions.   Objective:   Blood pressure 119/86, pulse 94, temperature 98.2 F (36.8 C), height 5\' 4"  (1.626 m), weight 271 lb (122.9 kg), SpO2 94 %. Body mass index is 46.52 kg/m.  General: Cooperative, alert, well developed, in no acute distress. HEENT: Conjunctivae and lids  unremarkable. Cardiovascular: Regular rhythm.  Lungs: Normal work of breathing. Neurologic: No focal deficits.   Lab Results  Component Value Date   CREATININE 0.63 07/07/2020   BUN 9 07/07/2020   NA 139 07/07/2020   K 4.5 07/07/2020   CL 102 07/07/2020   CO2 25 07/07/2020   Lab Results  Component Value Date   ALT 18 07/07/2020   AST 18 07/07/2020   ALKPHOS 99 07/07/2020   BILITOT 0.4 07/07/2020   Lab Results  Component Value Date   HGBA1C 6.1 (H) 07/07/2020   HGBA1C 6.1 (H) 03/01/2020   HGBA1C 5.8 (H) 08/11/2019   HGBA1C 5.9 (H) 02/02/2019   HGBA1C 5.9 (H) 09/25/2018   Lab Results  Component Value Date   INSULIN 10.5 07/07/2020   INSULIN 15.2 03/01/2020   INSULIN 14.4 08/11/2019   INSULIN 13.7 02/02/2019   INSULIN 13.5 09/25/2018   Lab Results  Component Value Date   TSH 4.450 06/05/2018   Lab Results  Component Value Date   CHOL 184 07/07/2020   HDL 55 07/07/2020   LDLCALC 109 (H) 07/07/2020   TRIG 109 07/07/2020   CHOLHDL 3.3 07/07/2020   Lab Results  Component Value Date   WBC 7.1 07/13/2019   HGB 15.7 (H)  07/13/2019   HCT 47.8 (H) 07/13/2019   MCV 89.7 07/13/2019   PLT 359 07/13/2019   No results found for: IRON, TIBC, FERRITIN  Attestation Statements:   Reviewed by clinician on day of visit: allergies, medications, problem list, medical history, surgical history, family history, social history, and previous encounter notes.  IMarianna Payment, am acting as transcriptionist for Alois Cliche, PA-C   I have reviewed the above documentation for accuracy and completeness, and I agree with the above. Alois Cliche, PA-C

## 2020-10-04 ENCOUNTER — Ambulatory Visit (HOSPITAL_BASED_OUTPATIENT_CLINIC_OR_DEPARTMENT_OTHER): Payer: BC Managed Care – PPO

## 2020-11-01 ENCOUNTER — Ambulatory Visit (HOSPITAL_BASED_OUTPATIENT_CLINIC_OR_DEPARTMENT_OTHER): Payer: BC Managed Care – PPO

## 2020-11-20 ENCOUNTER — Other Ambulatory Visit: Payer: Self-pay | Admitting: Family Medicine

## 2020-11-20 DIAGNOSIS — E039 Hypothyroidism, unspecified: Secondary | ICD-10-CM

## 2020-11-21 ENCOUNTER — Telehealth: Payer: Self-pay | Admitting: Family Medicine

## 2020-11-21 NOTE — Telephone Encounter (Signed)
Rx resent to pharmacy

## 2020-11-21 NOTE — Telephone Encounter (Signed)
Patient states she lost the medication. Need new rx

## 2021-01-03 ENCOUNTER — Encounter: Payer: Self-pay | Admitting: Family Medicine

## 2021-01-03 ENCOUNTER — Other Ambulatory Visit (HOSPITAL_BASED_OUTPATIENT_CLINIC_OR_DEPARTMENT_OTHER): Payer: Self-pay | Admitting: Family Medicine

## 2021-01-03 ENCOUNTER — Other Ambulatory Visit: Payer: Self-pay

## 2021-01-03 ENCOUNTER — Ambulatory Visit: Payer: BC Managed Care – PPO | Admitting: Family Medicine

## 2021-01-03 ENCOUNTER — Ambulatory Visit (HOSPITAL_BASED_OUTPATIENT_CLINIC_OR_DEPARTMENT_OTHER)
Admission: RE | Admit: 2021-01-03 | Discharge: 2021-01-03 | Disposition: A | Discharge status: Home / Self Care | Payer: BC Managed Care – PPO | Source: Ambulatory Visit | Attending: Family Medicine | Admitting: Family Medicine

## 2021-01-03 ENCOUNTER — Encounter (HOSPITAL_BASED_OUTPATIENT_CLINIC_OR_DEPARTMENT_OTHER): Payer: Self-pay

## 2021-01-03 VITALS — BP 130/82 | HR 96 | Temp 98.2°F | Ht 64.0 in | Wt 281.1 lb

## 2021-01-03 DIAGNOSIS — G4733 Obstructive sleep apnea (adult) (pediatric): Secondary | ICD-10-CM | POA: Diagnosis not present

## 2021-01-03 DIAGNOSIS — Z1231 Encounter for screening mammogram for malignant neoplasm of breast: Secondary | ICD-10-CM | POA: Insufficient documentation

## 2021-01-03 DIAGNOSIS — J449 Chronic obstructive pulmonary disease, unspecified: Secondary | ICD-10-CM

## 2021-01-03 DIAGNOSIS — Z9989 Dependence on other enabling machines and devices: Secondary | ICD-10-CM

## 2021-01-03 MED ORDER — FLUTICASONE-SALMETEROL 250-50 MCG/DOSE IN AEPB: 1.0000 | INHALATION_SPRAY | Freq: Two times a day (BID) | RESPIRATORY_TRACT | 2 refills | Status: DC

## 2021-01-03 MED ORDER — UMECLIDINIUM BROMIDE 62.5 MCG/INH IN AEPB
1.0000 | INHALATION_SPRAY | Freq: Every day | RESPIRATORY_TRACT | 5 refills | Status: DC
Start: 2021-01-03 — End: 2021-04-03

## 2021-01-03 NOTE — Progress Notes (Signed)
Chief Complaint  Patient presents with  . Referral    Pulmonary and CPAP    Subjective: Patient is a 60 y.o. female here for OSA f/u.  Pt w a hx of OSA on CPAP. She needs new supplies. Her last sleep study was in 2015 w WF. She does not have a sleep specialist at this time.  COPD Over the last several months, the patient has been having worsening breathing, particularly with exertion.  She is compliant with her Spiriva daily.  She was doing much better on Incruse but her insurance stopped paying for it.  She is not on any other controller medications.  She will have intermittent wheezing, particularly with exertion.  She will cough intermittently.  She does not follow with a lung specialist.  Past Medical History:  Diagnosis Date  . Anemia   . Asthma   . Blood in stool   . Colon polyps   . COPD (chronic obstructive pulmonary disease) (HCC)   . Depression   . Depression   . GERD (gastroesophageal reflux disease)   . Heart murmur    childhood  . 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 History of blood transfusion 2006  . HLD (hyperlipidemia)   . Obesity   . Sleep apnea   . Thyroid disease     Objective: BP 130/82 (BP Location: Left Arm, Patient Position: Sitting, Cuff Size: Normal)   Pulse 96   Temp 98.2 F (36.8 C) (Oral)   Ht 5\' 4"  (1.626 m)   Wt 281 lb 2 oz (127.5 kg)   SpO2 98%   BMI 48.25 kg/m  General: Awake, appears stated age HEENT: MMM, EOMi Heart: RRR, no murmurs Lungs: Decreased breath sounds diffusely, slight wheezes heard in upper lung field on L. No accessory muscle use Psych: Age appropriate judgment and insight, normal affect and mood  Assessment and Plan: Chronic obstructive pulmonary disease, unspecified COPD type (HCC) - Plan: umeclidinium bromide (INCRUSE ELLIPTA) 62.5 MCG/INH AEPB, Ambulatory referral to Pulmonology, For home use only DME continuous positive airway pressure (CPAP)  OSA on CPAP - Plan: Ambulatory referral  to Pulmonology, For home use only DME continuous positive airway pressure (CPAP)  Morbid obesity (HCC) - Plan: For home use only DME continuous positive airway pressure (CPAP)  We will reorder CPAP and supplies, refer to pulm. Reorder Incruse to see if ins covers, may have to stay with Spiriva. Add Advair, rinse mouth out after use. She will f/u w pulm for this, if wait is substantial I will see her next mo. If she is able to get in with them, I will see her in 3 mo for CPE as we need to catch up on HM items.  The patient voiced understanding and agreement to the plan.  [place], DO 01/03/21  11:40 AM

## 2021-01-03 NOTE — Patient Instructions (Addendum)
If you do not hear anything about your referral in the next 1-2 weeks, call our office and ask for an update.  Keep the diet clean and stay active.  Rinse your mouth out after using the Advair.   Let me know if you are having any cost issues with either of your medications.   Call Center for [facility] at [facility] at [phone] for an appointment.  They are located at [address], [place], [place], [address] (right across the hall from our office).  Let us know if you need anything.

## 2021-01-10 ENCOUNTER — Other Ambulatory Visit: Payer: Self-pay | Admitting: Family Medicine

## 2021-01-10 DIAGNOSIS — J449 Chronic obstructive pulmonary disease, unspecified: Secondary | ICD-10-CM

## 2021-01-10 DIAGNOSIS — G4733 Obstructive sleep apnea (adult) (pediatric): Secondary | ICD-10-CM

## 2021-01-17 ENCOUNTER — Other Ambulatory Visit (INDEPENDENT_AMBULATORY_CARE_PROVIDER_SITE_OTHER): Payer: Self-pay | Admitting: Physician Assistant

## 2021-01-17 DIAGNOSIS — E7849 Other hyperlipidemia: Secondary | ICD-10-CM

## 2021-01-17 NOTE — Telephone Encounter (Signed)
Last seen Tracey 

## 2021-01-20 ENCOUNTER — Other Ambulatory Visit: Payer: Self-pay | Admitting: Family Medicine

## 2021-01-20 DIAGNOSIS — E7849 Other hyperlipidemia: Secondary | ICD-10-CM

## 2021-01-20 MED ORDER — ATORVASTATIN CALCIUM 20 MG PO TABS: 20.0000 mg | ORAL_TABLET | Freq: Every day | ORAL | 1 refills | Status: DC

## 2021-01-20 MED ORDER — SPIRIVA HANDIHALER 18 MCG IN CAPS: 18.0000 ug | ORAL_CAPSULE | Freq: Every day | RESPIRATORY_TRACT | 1 refills | Status: DC

## 2021-01-27 ENCOUNTER — Other Ambulatory Visit: Payer: Self-pay | Admitting: Family Medicine

## 2021-01-27 DIAGNOSIS — B354 Tinea corporis: Secondary | ICD-10-CM

## 2021-02-25 ENCOUNTER — Other Ambulatory Visit: Payer: Self-pay | Admitting: Family Medicine

## 2021-03-01 ENCOUNTER — Other Ambulatory Visit: Payer: Self-pay | Admitting: Family Medicine

## 2021-03-01 MED ORDER — CLOBETASOL PROPIONATE 0.05 % EX SOLN: 1.0000 "application " | Freq: Two times a day (BID) | CUTANEOUS | 1 refills | Status: DC | PRN

## 2021-03-24 ENCOUNTER — Other Ambulatory Visit: Payer: Self-pay | Admitting: Family Medicine

## 2021-03-29 ENCOUNTER — Other Ambulatory Visit: Payer: Self-pay | Admitting: Family Medicine

## 2021-03-29 DIAGNOSIS — E039 Hypothyroidism, unspecified: Secondary | ICD-10-CM

## 2021-04-03 ENCOUNTER — Encounter: Payer: Self-pay | Admitting: Family Medicine

## 2021-04-03 ENCOUNTER — Ambulatory Visit (INDEPENDENT_AMBULATORY_CARE_PROVIDER_SITE_OTHER): Payer: BC Managed Care – PPO | Admitting: Family Medicine

## 2021-04-03 ENCOUNTER — Other Ambulatory Visit: Payer: Self-pay | Admitting: Family Medicine

## 2021-04-03 ENCOUNTER — Other Ambulatory Visit: Payer: Self-pay

## 2021-04-03 VITALS — BP 132/72 | HR 97 | Temp 98.2°F | Ht 64.0 in | Wt 273.1 lb

## 2021-04-03 DIAGNOSIS — E038 Other specified hypothyroidism: Secondary | ICD-10-CM

## 2021-04-03 DIAGNOSIS — J449 Chronic obstructive pulmonary disease, unspecified: Secondary | ICD-10-CM | POA: Diagnosis not present

## 2021-04-03 DIAGNOSIS — R7303 Prediabetes: Secondary | ICD-10-CM

## 2021-04-03 DIAGNOSIS — S46211A Strain of muscle, fascia and tendon of other parts of biceps, right arm, initial encounter: Secondary | ICD-10-CM

## 2021-04-03 DIAGNOSIS — E7849 Other hyperlipidemia: Secondary | ICD-10-CM | POA: Insufficient documentation

## 2021-04-03 DIAGNOSIS — Z0000 Encounter for general adult medical examination without abnormal findings: Secondary | ICD-10-CM | POA: Diagnosis not present

## 2021-04-03 DIAGNOSIS — H9193 Unspecified hearing loss, bilateral: Secondary | ICD-10-CM

## 2021-04-03 LAB — CBC
HCT: 45.2 % (ref 36.0–46.0)
Hemoglobin: 15.1 g/dL — ABNORMAL HIGH (ref 12.0–15.0)
MCHC: 33.3 g/dL (ref 30.0–36.0)
MCV: 87.6 fl (ref 78.0–100.0)
Platelets: 300 10*3/uL (ref 150.0–400.0)
RBC: 5.16 Mil/uL — ABNORMAL HIGH (ref 3.87–5.11)
RDW: 14.2 % (ref 11.5–15.5)
WBC: 6.2 10*3/uL (ref 4.0–10.5)

## 2021-04-03 LAB — COMPREHENSIVE METABOLIC PANEL
ALT: 13 U/L (ref 0–35)
AST: 14 U/L (ref 0–37)
Albumin: 4.2 g/dL (ref 3.5–5.2)
Alkaline Phosphatase: 97 U/L (ref 39–117)
BUN: 13 mg/dL (ref 6–23)
CO2: 29 mEq/L (ref 19–32)
Calcium: 9.6 mg/dL (ref 8.4–10.5)
Chloride: 100 mEq/L (ref 96–112)
Creatinine, Ser: 0.59 mg/dL (ref 0.40–1.20)
GFR: 97.71 mL/min (ref >60.00)
Glucose, Bld: 105 mg/dL — ABNORMAL HIGH (ref 70–99)
Potassium: 4.8 mEq/L (ref 3.5–5.1)
Sodium: 139 mEq/L (ref 135–145)
Total Bilirubin: 0.5 mg/dL (ref 0.2–1.2)
Total Protein: 7 g/dL (ref 6.0–8.3)

## 2021-04-03 LAB — LIPID PANEL
Cholesterol: 195 mg/dL (ref 0–200)
HDL: 57.9 mg/dL (ref >39.00)
LDL Cholesterol: 112 mg/dL — ABNORMAL HIGH (ref 0–99)
NonHDL: 136.66
Total CHOL/HDL Ratio: 3
Triglycerides: 125 mg/dL (ref 0.0–149.0)
VLDL: 25 mg/dL (ref 0.0–40.0)

## 2021-04-03 LAB — HEMOGLOBIN A1C: Hgb A1c MFr Bld: 6.3 % (ref 4.6–6.5)

## 2021-04-03 LAB — T4, FREE: Free T4: 1.16 ng/dL (ref 0.60–1.60)

## 2021-04-03 LAB — TSH: TSH: 0.78 u[IU]/mL (ref 0.35–4.50)

## 2021-04-03 MED ORDER — SEMAGLUTIDE-WEIGHT MANAGEMENT 2.4 MG/0.75ML ~~LOC~~ SOAJ: 2.4000 mg | SUBCUTANEOUS | 0 refills | Status: DC

## 2021-04-03 MED ORDER — SEMAGLUTIDE-WEIGHT MANAGEMENT 0.5 MG/0.5ML ~~LOC~~ SOAJ: 0.5000 mg | SUBCUTANEOUS | 0 refills | Status: DC

## 2021-04-03 MED ORDER — SEMAGLUTIDE-WEIGHT MANAGEMENT 1 MG/0.5ML ~~LOC~~ SOAJ: 1.0000 mg | SUBCUTANEOUS | 0 refills | Status: DC

## 2021-04-03 MED ORDER — SEMAGLUTIDE-WEIGHT MANAGEMENT 1.7 MG/0.75ML ~~LOC~~ SOAJ: 1.7000 mg | SUBCUTANEOUS | 0 refills | Status: DC

## 2021-04-03 MED ORDER — SEMAGLUTIDE-WEIGHT MANAGEMENT 0.25 MG/0.5ML ~~LOC~~ SOAJ: 0.2500 mg | SUBCUTANEOUS | 0 refills | Status: AC

## 2021-04-03 NOTE — Patient Instructions (Addendum)
Give [name] 2-3 business days to get the results of your labs back.   If you would like [name] to refer you back to the lung team sooner than in 6 months, let me know.   Keep the diet clean and stay active.  Heat (pad or rice pillow in microwave) over affected area, 10-15 minutes twice daily.   Ice/cold pack over area for 10-15 min twice daily.  Send me a message in 1 month if you are not turning the corner with your arm and we will set you up with sports medicine.  Call Center for [facility] at [facility] at [phone] for an appointment.  They are located at [address], [address], [place], [place], [address] (right across the hall from our office).  The new Shingrix vaccine (for shingles) is a 2 shot series. It can make people feel low energy, achy and almost like they have the flu for 48 hours after injection. Please plan accordingly when deciding on when to get this shot. Call our office for a nurse visit appointment to get this. The second shot of the series is less severe regarding the side effects, but it still lasts 48 hours.   Let us know if you need anything.   Biceps Tendon Disruption (Proximal) Rehab Do exercises exactly as told by your health care provider and adjust them as directed. It is normal to feel mild stretching, pulling, tightness, or discomfort as you do these exercises, but you should stop right away if you feel sudden pain or your pain gets worse. Stretching and range of motion exercises These exercises warm up your muscles and joints and improve the movement and flexibility of your arm and shoulder. These exercises also help to relieve pain and stiffness. Exercise A: Shoulder flexion, standing   1. Stand facing a wall. Put your left / right hand on the wall. 2. Slide your left / right hand up the wall. Stop when you feel a stretch in your shoulder, or when you reach the angle recommended by your health care provider.  Use your other hand to help  raise your arm, if needed.  As your hand gets higher, you may need to step closer to the wall.  Avoid shrugging your shoulder while you raise your arm. To do this, keep your shoulder blade tucked down toward your spine. 3. Hold for 30 seconds. 4. Slowly return to the starting position. Use your other arm to help, if needed. Repeat 2 times. Complete this exercise 3 times per week. Exercise B: Pendulum   1. Stand near a wall or a surface that you can hold onto for balance. 2. Bend at the waist and let your left / right arm hang straight down. Use your other arm to support you. 3. Relax your arm and shoulder muscles, and move your hips and your trunk so your left / right arm swings freely. Your arm should swing because of the motion of your body, not because you are using your arm or shoulder muscles. 4. Keep moving so your arm swings in the following directions, as told by your health care provider:  Side to side.  Forward and backward.  In clockwise and counterclockwise circles. 5. Slowly return to the starting position. Repeat 2 times. Complete this exercise 3 times per week.  Strengthening exercises These exercises build strength and endurance in your arm and shoulder. Endurance is the ability to use your muscles for a long time, even after your  muscles get tired. Exercise C: Elbow flexion, neutral  1. Sit on a stable chair without armrests, or stand. 2. Hold a 3-5 lb weight in your left / right hand, or hold an exercise band with both hands. Your palms should face each other at the starting position. 3. Bend your left / right elbow and move your hand up toward your shoulder.  Lead with your thumb, and keep your palm facing the same direction.  Keep your other arm straight down, in the starting position. 4. Slowly return to the starting position. Repeat 2-3 times. Complete this exercise 3 times per week. Exercise D: Forearm supination   1. Sit with your left / right forearm on a  table. Your elbow should be below shoulder height. Rest your hand over the edge of the table so your palm faces down. 2. If directed, hold a hammer with your left / right hand. 3. Without moving your elbow, slowly rotate your hand so your palm faces up toward the ceiling.  If you are holding a hammer, begin by holding the hammer near the head. When this exercise gets easier for you, hold the hammer farther down the handle. 4. Hold for 3 seconds. 5. Slowly return to the starting position. Repeat 2 times. Complete this exercise 3 times per week. Exercise E: Scapular retraction   1. Sit in a stable chair without armrests, or stand. 2. Secure an exercise band to a stable object in front of you so the band is at shoulder height. 3. Hold one end of the exercise band in each hand. 4. Squeeze your shoulder blades together and move your elbows slightly behind you. Do not shrug your shoulders. 5. Hold for 3 seconds. 6. Slowly return to the starting position. Repeat 2 times. Complete this exercise 3 times per week. Exercise F: Scapular protraction, supine   1. Lie on your back on a firm surface. Hold a 3-5 lb weight in your left / right hand. 2. Raise your left / right arm straight into the air so your hand is directly above your shoulder joint. 3. Push the weight into the air so your shoulder lifts off of the surface that you are lying on. Do not move your head, neck, or back. 4. Hold for 3 seconds. 5. Slowly return to the starting position. Let your muscles relax completely before you repeat this exercise. Repeat 2 times. Complete this exercise 3 times per week. This information is not intended to replace advice given to you by your health care provider. Make sure you discuss any questions you have with your health care provider. Document Released: 11/12/2005 Document Revised: 07/19/2016 Document Reviewed: 10/21/2015 Elsevier Interactive Patient Education  2017 [employer]  Biceps Tendon  Disruption (Distal) Rehab Ask your health care provider which exercises are safe for you. Do exercises exactly as told by your health care provider and adjust them as directed. It is normal to feel mild stretching, pulling, tightness, or discomfort as you do these exercises, but you should stop right away if you feel sudden pain or your pain gets worse. Do not begin these exercises until told by your health care provider. Stretching and range of motion exercises These exercises warm up your muscles and joints and improve the movement and flexibility of your arm. These exercises can also help to relieve pain, numbness, and tingling. Exercise A: Elbow flexion, passive  1. Stand or sit with your left / right arm at your side. 2. Use your other hand to  gently push your left / right hand toward your shoulder. Bend your elbow as far as your health care provider tells you to. You may be instructed to try to bend your elbow more and more over time. 3. Hold for 10 seconds. 4. Slowly return to the starting position. Repeat 2 times. Complete this exercise 3 times per week. Exercise B: Supination, passive 1. Sit with your left / right elbow bent to an "L" shape (90 degrees) and your forearm resting on a table, palm-down. 2. Keeping your upper body and shoulder still, use your other hand to rotate your left / right palm up. Stop when you feel a gentle to moderate stretch. 3. Hold for 10 seconds. 4. Slowly return to the starting position. Repeat 2 times. Complete this exercise 3 times per week. Exercise C: Pronation, passive  1. Sit with your left / right elbow bent to an "L" shape (90 degrees) and your forearm resting on a table, palm-up. 2. Keeping your upper body and shoulder still, use your other hand to rotate your left / right palm down. Stop when you feel a gentle to moderate stretch. 3. Hold for 10 seconds. 4. Slowly return to the starting position. Repeat 2 times. Complete this exercise 3 times per  week. Exercise D: Elbow extension, active Start this exercise only when your health care provider tells you. 1. Stand or sit with your left / right elbow bent and your palm facing in, toward your body. 2. Slowly straighten your elbow using only your arm muscles. Stop when you feel a very slight stretch at the front of your arm, or when you reach the position where your health care provider tells you to stop. You may be instructed to try to straighten your arm more and more each week. 3. Hold for 3 seconds. 4. Slowly return to the starting position. Repeat 2 times. Complete this exercise 3 times per week. Exercise E: Elbow flexion, active Start this exercise only when your health care provider tells you. 1. Stand or sit with your left / right elbow bent and your palm facing in, toward your body. 2. Bend your elbow as far as you can using only your arm muscles. 3. Hold for 3 seconds. 4. Slowly return to the starting position. Repeat 2 times. Complete this exercise 3 times per week. Exercise F: Supination, active Start this exercise only when your health care provider tells you. 1. Stand or sit with your left / right elbow bent to an "L" shape (90 degrees). 2. Rotate your palm up until you feel a gentle stretch on the inside of your forearm. 3. Hold for 3 seconds. 4. Slowly return to the starting position. Repeat 2 times. Complete this exercise 3 times per week. Exercise G: Pronation, active  Start this exercise only when your health care provider tells you. 1. Stand or sit with your left / right elbow bent to an "L" shape (90 degrees). 2. Rotate your palm down until you feel a gentle stretch on the top of your forearm. 3. Hold for 3 seconds. 4. Slowly release and return to the starting position. Repeat 2 times. Complete this exercise 3 times per week. Exercise H: Elbow extension, passive, supine 1. Lie on your back in a comfortable position that allows you to relax your arm  muscles. 2. Place a folded towel under your left / right upper arm so your elbow and shoulder are at the same height. 3. Use your other arm to raise your left /  right arm until your elbow does not rest on the bed or towel. Hold your left / right arm out straight with your other hand supporting it. Let the weight of your hand stretch the inside of your elbow. ? Keep your arm and chest muscles relaxed. ? If directed, you may hold a 5-10 weight in your hand to increase the intensity of the stretch. 4. Hold for 3 seconds. 5. Slowly release the stretch. Repeat 2 times. Complete this exercise 3 times per week Strengthening exercises These exercises build strength and endurance in your arm and shoulder. Endurance is the ability to use your muscles for a long time, even after your muscles get tired. Do not start any strengthening exercises until told by your health care provider. Exercise I: Elbow flexion, isometric  1. Stand or sit with your left / right arm at waist height. Your palm should face in, toward your body. 2. Place your other hand on top of your left / right forearm. Gently push down while you resist with your left / right arm. ? Use about half (50%) effort with both arms. You may be instructed to use more and more effort with your arms each week. ? Try not to let your right / left elbow move. 3. Hold for 3 seconds. 4. Let your muscles relax completely before you repeat this exercise. Repeat 2 times. Complete this exercise 3 times per week. Exercise J: Elbow extension, isometric  1. Stand or sit with your left / right arm at waist height. Your palm should face in, toward your body. 2. Place your other hand on the bottom of your left / right forearm. Gently push up while you resist with your left / right arm. ? Use about half (50%) effort with both arms. You may be instructed to use more and more effort with your arms each week. ? Try not to let your left / right elbow move. 3. Hold for  3 seconds. 4. Let your muscles relax completely before you repeat this exercise. Repeat 2 times. Complete this exercise 3 times per week. Exercise K: Biceps curls ( elbow flexion, supinated) 1. Sit on a stable chair without armrests, or stand. 2. Hold a 5-10 lb weight in your left / right hand. Your palm should face out, away from your body, at the starting position. 3. Bend your left / right elbow and move your hand up toward your shoulder. 4. Slowly return to the starting position. Repeat 2 times. Complete this exercise 3 times per week. Exercise L: Hammer curls ( elbow flexion, neutral forearm) 1. Sit on a stable chair without armrests, or stand. 2. Hold a 5-10 lb weight in your left / right hand, or hold an exercise band with both hands. Your palms should face each other at the starting position. 3. Bend your left / right elbow and move your hand up toward your shoulder. Keep your other arm straight. 4. Slowly return to the starting position. Repeat 2 times. Complete this exercise 3 times per week. Exercise M: Triceps curls ( elbow extension) 1. Lie on your back. 2. Hold a 5-10 lb weight in your left / right hand. 3. Bend your left / right elbow to an "L" shape (90 degrees) so the weight is in front of your face, over your chest, and your elbow is pointed up to the ceiling. 4. Straighten your elbow, raising your hand toward the ceiling. Use your other hand to support your left / right upper arm. 5.  Slowly return to the starting position. Repeat 2 times. Complete this exercise 3 times per week. Exercise N: Elbow extension with exercise band  1. Sit on a stable chair without armrests, or stand. 2. Hold an exercise band in both hands. 3. Keeping your upper arms at your side, bring both hands up to your shoulder. Keep your left / right hand just below your other hand. 4. Straighten your left / right elbow while keeping your other arm still. 5. Hold for 3 seconds. 6. Slowly bend your  elbow to return to the starting position. Repeat 2 times. Complete this exercise 3 times per week. Exercise O: Supination  1. Sit with the your left / right forearm supported on a table. Your elbow should be at waist height. 2. Rest your hand over the edge of the table, palm-down. 3. Gently grasp a lightweight hammer. 4. Without moving your left / right elbow, slowly rotate your palm up, to a "thumbs-up" position. 5. Hold for 3 seconds. 6. Slowly return to the starting position. Repeat 2 times. Complete this exercise 3 times per weeky. Exercise P: Pronation  1. Sit with your left / right forearm supported on a table. Your elbow should be at waist height. 2. Rest your hand over the edge of the table, palm-up. 3. Gently grasp a lightweight hammer. 4. Without moving your left / right elbow, slowly rotate your palm down. 5. Hold for 3 seconds. 6. Slowly return to the starting position. Repeat 2 times. Complete this exercise 3 times per week. This information is not intended to replace advice given to you by your health care provider. Make sure you discuss any questions you have with your health care provider. Document Released: 11/12/2005 Document Revised: 07/19/2016 Document Reviewed: 08/07/2015 Elsevier Interactive Patient Education  [employer].

## 2021-04-03 NOTE — Progress Notes (Signed)
Chief Complaint  Patient presents with  . Annual Exam     Well Woman Pamela [name] is here for a complete physical.  Her last physical was >1 year ago.  Current diet: in general, a "healthy" diet. Current exercise: some walking/swimming. Weight is decreased a little intentionally and she denies fatigue out of ordinary. Seatbelt? Yes Loss of interested in doing things or depression in past 2 weeks? No  Health Maintenance Pap/HPV- No Mammogram- Yes Colon cancer screening-Yes Shingrix- No Tetanus- Yes Hep C screening- Yes HIV screening- Yes   Arm pain 1 year of R bicep pain. Tried home stretches that help. Still not fully better. No inj or or change in active.  No bruising, swelling, redness, or decreased range of motion.  No neurologic signs or symptoms.  COPD/weight The patient is currently following with the medical weight loss team.  She had Lap-Band surgery in 2010.  She is struggling with her weight.  She is compliant with her Synthroid.  She feels like she cannot exercise due to her breathing.  She has Spiriva and Wixela which she is compliant with.  She was referred to pulmonology in the past but had to cancel due to work.  She was unable to reschedule.  Diet and exercise as above.  Past Medical History:  Diagnosis Date  . Anemia   . Asthma   . Blood in stool   . Colon polyps   . COPD (chronic obstructive pulmonary disease) (HCC)   . Depression   . Depression   . GERD (gastroesophageal reflux disease)   . Heart murmur    childhood  . 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 History of blood transfusion 2006  . HLD (hyperlipidemia)   . Obesity   . Sleep apnea   . Thyroid disease      Past Surgical History:  Procedure Laterality Date  . AUGMENTATION MAMMAPLASTY Bilateral 1987   Saline  . BREAST BIOPSY    . INSERTION OF MESH N/A 07/16/2019   Procedure: Insertion Of Mesh;  Surgeon: Abigail Miyamoto, MD;  Location: [facility] OR;  Service:  General;  Laterality: N/A;  . LAPAROSCOPIC GASTRIC BANDING  2010  . VENTRAL HERNIA REPAIR N/A 07/16/2019   Procedure: VENTRAL INCISIONAL HERNIA REPAIR WITH MESH;  Surgeon: Abigail Miyamoto, MD;  Location: [facility] OR;  Service: General;  Laterality: N/A;    Medications  Current Outpatient Medications on File Prior to Visit  Medication Sig Dispense Refill  . albuterol (VENTOLIN HFA) 108 (90 Base) MCG/ACT inhaler Inhale 1-2 puffs into the lungs every 6 (six) hours as needed for wheezing or shortness of breath. 18 g 5  . atorvastatin (LIPITOR) 20 MG tablet Take 1 tablet (20 mg total) by mouth at bedtime. 90 tablet 1  . Cholecalciferol (VITAMIN D) 125 MCG (5000 UT) CAPS Take 1 capsule by mouth daily. 30 capsule 0  . citalopram (CELEXA) 20 MG tablet Take 20 mg by mouth daily.    . clobetasol (TEMOVATE) 0.05 % external solution Apply 1 application topically 2 (two) times daily as needed (irritation). 50 mL 1  . ketoconazole (NIZORAL) 2 % cream Apply topically daily. 30 g 1  . levothyroxine (SYNTHROID) 150 MCG tablet TAKE 1 TABLET(150 MCG) BY MOUTH DAILY BEFORE AND BREAKFAST 30 tablet 3  . Melatonin 5 MG TABS Take 5 mg by mouth at bedtime.    . modafinil (PROVIGIL) 200 MG tablet Take 200 mg by mouth daily.    [name]  omeprazole (PRILOSEC) 40 MG capsule TAKE 1 CAPSULE(40 MG) BY MOUTH DAILY AS NEEDED FOR HEARTBURN 90 capsule 1  . SPIRIVA HANDIHALER 18 MCG inhalation capsule PLACE A CAPSULE INTO INHALER AND INHALE DAILY 30 capsule 1  . WIXELA INHUB 250-50 MCG/ACT AEPB INHALE 1 PUFF INTO THE LUNGS IN THE MORNING AND AT BEDTIME 60 each 2   Allergies No Known Allergies  Review of Systems: Constitutional:  no unexpected weight changes Eye:  no recent significant change in vision Ear/Nose/Mouth/Throat:  Ears: + Hearing loss Nose/Mouth/Throat:  no complaints of nasal congestion, no sore throat Cardiovascular: no chest pain Respiratory:  no new shortness of breath Gastrointestinal:  no abdominal pain, no change  in bowel habits GU:  Female: negative for dysuria or pelvic pain Musculoskeletal/Extremities:  + Right arm pain Integumentary (Skin/Breast):  no abnormal skin lesions reported Neurologic:  no headaches Endocrine:  denies fatigue  Exam BP 132/72 (BP Location: Left Arm, Patient Position: Sitting, Cuff Size: Normal)   Pulse 97   Temp 98.2 F (36.8 C) (Oral)   Ht 5\' 4"  (1.626 m)   Wt 273 lb 2 oz (123.9 kg)   SpO2 99%   BMI 46.88 kg/m  General:  well developed, well nourished, in no apparent distress Skin:  no significant moles, warts, or growths Head:  no masses, lesions, or tenderness Eyes:  pupils equal and round, sclera anicteric without injection Ears:  canals without lesions, TMs shiny without retraction, no obvious effusion, no erythema Nose:  nares patent, septum midline, mucosa normal, and no drainage or sinus tenderness Throat/Pharynx:  lips and gingiva without lesion; tongue and uvula midline; non-inflamed pharynx; no exudates or postnasal drainage Neck: neck supple without adenopathy, thyromegaly, or masses Lungs:  clear to auscultation, breath sounds equal bilaterally, no respiratory distress Cardio:  regular rate and rhythm, no LE edema Abdomen:  abdomen soft, nontender; bowel sounds normal; no masses or organomegaly Genital: Defer to GYN Musculoskeletal: + Mild tenderness over the mid belly of the right biceps, positive speeds; negative liftoff, crossover, O'Briens, Neer's, Hawkins Extremities:  no clubbing, cyanosis, or edema, no deformities, no skin discoloration Neuro:  gait normal; deep tendon reflexes normal and symmetric Psych: well oriented with normal range of affect and appropriate judgment/insight  Assessment and Plan  Well adult exam  Chronic obstructive pulmonary disease, unspecified COPD type (HCC)  Prediabetes - Plan: Hemoglobin A1c  Other specified hypothyroidism - Plan: TSH, T4, free  Other hyperlipidemia - Plan: Comprehensive metabolic panel,  Lipid panel, CBC  Bilateral hearing loss, unspecified hearing loss type - Plan: Ambulatory referral to Audiology  Strain of right biceps, initial encounter  Morbid obesity (HCC)   Well 60 y.o. female. Counseled on diet and exercise. Other orders as above. Continue Wixela and Spiriva.  Offered pulmonology referral but she declined at this time. For her weight, we will check above labs.  If normal, will try to send in semaglutide.  Continue following with the weight loss clinic. For the biceps, I gave her stretches and exercises.  Heat, ice, Tylenol.  If no improvement in the next month, she will send me a message and I will refer her to the sports medicine team. Shingrix information discussed and written down. She was instructed to follow-up with her gynecologist for cervical cancer screening. Follow up in 6 months or as needed. The patient voiced understanding and agreement to the plan.  [address], DO 04/03/21 12:02 PM

## 2021-04-04 ENCOUNTER — Other Ambulatory Visit: Payer: Self-pay | Admitting: Family Medicine

## 2021-04-04 ENCOUNTER — Telehealth: Payer: Self-pay | Admitting: Family Medicine

## 2021-04-04 DIAGNOSIS — R7989 Other specified abnormal findings of blood chemistry: Secondary | ICD-10-CM

## 2021-04-04 NOTE — Telephone Encounter (Signed)
PA completed and approved from 04/04/2021---11/04/2021. Patient / pharmacy has been notified of approval.

## 2021-05-12 MED ORDER — SEMAGLUTIDE-WEIGHT MANAGEMENT 1.7 MG/0.75ML ~~LOC~~ SOAJ: 1.7000 mg | SUBCUTANEOUS | 0 refills | Status: DC

## 2021-05-16 ENCOUNTER — Other Ambulatory Visit (INDEPENDENT_AMBULATORY_CARE_PROVIDER_SITE_OTHER): Payer: BC Managed Care – PPO

## 2021-05-16 ENCOUNTER — Other Ambulatory Visit: Payer: Self-pay

## 2021-05-16 DIAGNOSIS — R7989 Other specified abnormal findings of blood chemistry: Secondary | ICD-10-CM | POA: Diagnosis not present

## 2021-05-16 LAB — CBC
HCT: 44.6 % (ref 36.0–46.0)
Hemoglobin: 15 g/dL (ref 12.0–15.0)
MCHC: 33.7 g/dL (ref 30.0–36.0)
MCV: 86.6 fl (ref 78.0–100.0)
Platelets: 314 10*3/uL (ref 150.0–400.0)
RBC: 5.15 Mil/uL — ABNORMAL HIGH (ref 3.87–5.11)
RDW: 15.1 % (ref 11.5–15.5)
WBC: 6.3 10*3/uL (ref 4.0–10.5)

## 2021-05-25 ENCOUNTER — Other Ambulatory Visit: Payer: Self-pay | Admitting: Family Medicine

## 2021-05-26 ENCOUNTER — Other Ambulatory Visit: Payer: Self-pay | Admitting: Family Medicine

## 2021-07-28 ENCOUNTER — Encounter: Payer: Self-pay | Admitting: Family Medicine

## 2021-07-28 ENCOUNTER — Telehealth (INDEPENDENT_AMBULATORY_CARE_PROVIDER_SITE_OTHER): Payer: BC Managed Care – PPO | Admitting: Family Medicine

## 2021-07-28 DIAGNOSIS — R42 Dizziness and giddiness: Secondary | ICD-10-CM

## 2021-07-28 DIAGNOSIS — H814 Vertigo of central origin: Secondary | ICD-10-CM | POA: Diagnosis not present

## 2021-07-28 NOTE — Progress Notes (Signed)
Chief Complaint  Patient presents with   Dizziness    Has gotten better    Pamela [name] is 60 y.o. pt here for dizziness. Due to COVID-19 pandemic, we are interacting via web portal for an electronic face-to-face visit. I verified patient's ID using 2 identifiers. Patient agreed to proceed with visit via this method. Patient is at home, I am at office. Patient and I are present for visit.   Duration: 2 days Pass out? No Spinning? Yes; started Wed night when she was driving.  Lasted around 8-9 hours.  Nothing made it better or worse. She was fatigued yesteday, no issues today.  Recent illness/fever? No Headache? No Neurologic signs? No Change in PO intake? No No new substance use.   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)   History of blood transfusion 2006   HLD (hyperlipidemia)    Obesity    Sleep apnea    Thyroid disease     Family History  Problem Relation Age of Onset   Alcohol abuse Mother    Depression Mother    Early death Mother    Liver disease Mother    Alcohol abuse Father    Obesity Father    Cancer Father     Allergies as of 07/28/2021   No Known Allergies      Medication List        Accurate as of July 28, 2021  3:23 PM. If you have any questions, ask your nurse or doctor.          albuterol 108 (90 Base) MCG/ACT inhaler Commonly known as: VENTOLIN HFA INHALE 1 TO 2 PUFFS INTO THE LUNGS EVERY 6 HOURS AS NEEDED FOR WHEEZING OR SHORTNESS OF BREATH   atorvastatin 20 MG tablet Commonly known as: LIPITOR Take 1 tablet (20 mg total) by mouth at bedtime.   citalopram 20 MG tablet Commonly known as: CELEXA Take 20 mg by mouth daily.   clobetasol 0.05 % external  solution Commonly known as: TEMOVATE Apply 1 application topically 2 (two) times daily as needed (irritation).   ketoconazole 2 % cream Commonly known as: NIZORAL Apply topically daily.   levothyroxine 150 MCG tablet Commonly known as: SYNTHROID TAKE 1 TABLET(150 MCG) BY MOUTH DAILY BEFORE AND BREAKFAST   melatonin 5 MG Tabs Take 5 mg by mouth at bedtime.   modafinil 200 MG tablet Commonly known as: PROVIGIL Take 200 mg by mouth daily.   omeprazole 40 MG capsule Commonly known as: PRILOSEC TAKE 1 CAPSULE(40 MG) BY MOUTH DAILY AS NEEDED FOR HEARTBURN   Semaglutide-Weight Management 1.7 MG/0.75ML Soaj Inject 1.7 mg into the skin every 14 (fourteen) days.   Spiriva HandiHaler 18 MCG inhalation capsule Generic drug: tiotropium INHALE CONTENTS OF 1 CAPSULE ONCE DAILY USING HANDIHALER   Vitamin D 125 MCG (5000 UT) Caps Take 1 capsule by mouth daily.   Wixela Inhub 250-50 MCG/ACT Aepb Generic drug: fluticasone-salmeterol INHALE 1 PUFF INTO THE LUNGS IN THE MORNING AND AT BEDTIME       Objective No conversational dyspnea Age appropriate judgment and insight Nml affect and mood  Vertigo  Vertigo of central origin - Plan: MR Brain Wo Contrast  New problem, uncertain prognosis. I am worried she had a cerebellar stroke. Will ck MRI brain. If + for acute stroke, will place on DAPT for 3 weeks and increase Lipitor to 80 mg/d and lastly refer to Neuro.  F/u prn. Pt voiced understanding and agreement to the plan.  [name] [place], DO 07/28/21 3:23 PM

## 2021-07-31 ENCOUNTER — Other Ambulatory Visit: Payer: Self-pay | Admitting: Family Medicine

## 2021-07-31 DIAGNOSIS — E7849 Other hyperlipidemia: Secondary | ICD-10-CM

## 2021-08-02 ENCOUNTER — Other Ambulatory Visit: Payer: Self-pay | Admitting: Family Medicine

## 2021-08-03 MED ORDER — SPIRIVA HANDIHALER 18 MCG IN CAPS: ORAL_CAPSULE | RESPIRATORY_TRACT | 3 refills | Status: DC

## 2021-08-05 ENCOUNTER — Ambulatory Visit (HOSPITAL_BASED_OUTPATIENT_CLINIC_OR_DEPARTMENT_OTHER)
Admission: RE | Admit: 2021-08-05 | Discharge: 2021-08-05 | Disposition: A | Discharge status: Home / Self Care | Payer: BC Managed Care – PPO | Source: Ambulatory Visit | Attending: Family Medicine | Admitting: Family Medicine

## 2021-08-05 ENCOUNTER — Other Ambulatory Visit: Payer: Self-pay

## 2021-08-05 DIAGNOSIS — H814 Vertigo of central origin: Secondary | ICD-10-CM | POA: Diagnosis not present

## 2021-08-08 ENCOUNTER — Other Ambulatory Visit: Payer: Self-pay | Admitting: Family Medicine

## 2021-08-08 MED ORDER — ASPIRIN 81 MG PO TBEC: 81.0000 mg | DELAYED_RELEASE_TABLET | Freq: Every day | ORAL | 12 refills | Status: DC

## 2021-09-27 ENCOUNTER — Other Ambulatory Visit: Payer: Self-pay | Admitting: Family Medicine

## 2021-10-04 ENCOUNTER — Ambulatory Visit: Payer: BC Managed Care – PPO | Admitting: Family Medicine

## 2021-10-09 ENCOUNTER — Other Ambulatory Visit: Payer: Self-pay

## 2021-10-09 ENCOUNTER — Telehealth (INDEPENDENT_AMBULATORY_CARE_PROVIDER_SITE_OTHER): Payer: BC Managed Care – PPO | Admitting: Family Medicine

## 2021-10-09 ENCOUNTER — Encounter: Payer: Self-pay | Admitting: Family Medicine

## 2021-10-09 DIAGNOSIS — J441 Chronic obstructive pulmonary disease with (acute) exacerbation: Secondary | ICD-10-CM

## 2021-10-09 IMAGING — MR MR HEAD W/O CM
10 of 11 series · 36 of 48 positions shown · non-contrast
Comparison: None.

CLINICAL DATA: Vertigo, central.

EXAM:
MRI HEAD WITHOUT CONTRAST
TECHNIQUE: Multiplanar, multiecho pulse sequences of the brain and surrounding
structures were obtained without intravenous contrast.

[Series 2: T1 · sagittal · 5.0mm · 0.49mm/px · 3 of 23 slices shown (1 of 2)]
[im 1/23]
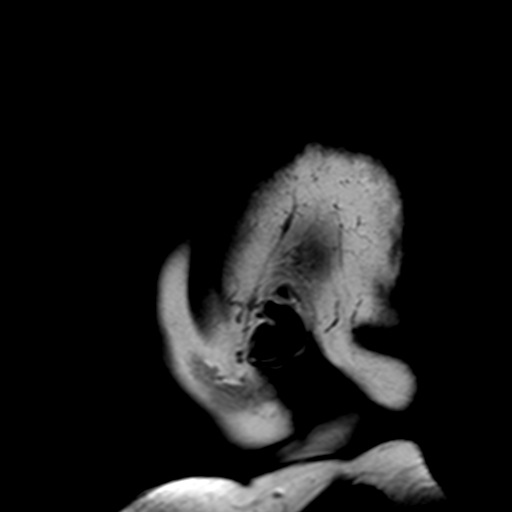
[im 12/23]
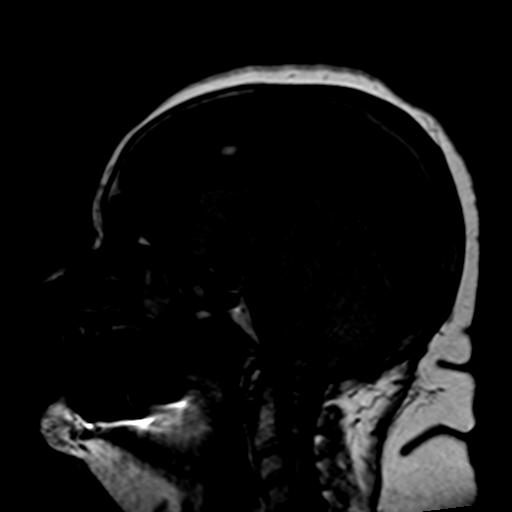
[im 23/23]
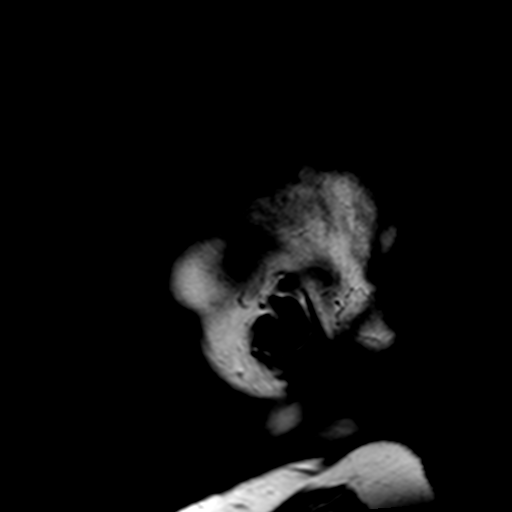

[Series 3: DWI · axial · 3.0mm · 2.00mm/px · z∈[-78,+87]mm · 8 of 112 slices shown (1 of 4)]
[im 1/112]
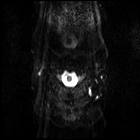
[im 16/112]
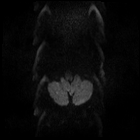
[im 32/112]
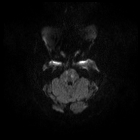
[im 48/112]
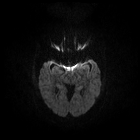
[im 64/112]
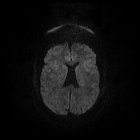
[im 80/112]
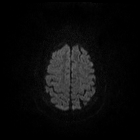
[im 96/112]
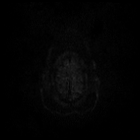
[im 112/112]
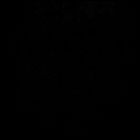

[Series 4: DWI · axial · 3.0mm · 2.00mm/px · z∈[-78,+84]mm · 4 of 55 slices shown (2 of 4)]
[im 1/55]
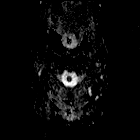
[im 19/55]
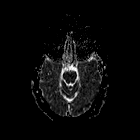
[im 37/55]
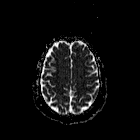
[im 55/55]
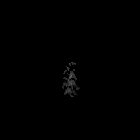

[Series 5: T2 · axial · 5.0mm · 0.36mm/px · z∈[-59,+100]mm · 2 of 30 slices shown (1 of 2)]
[im 1/30]
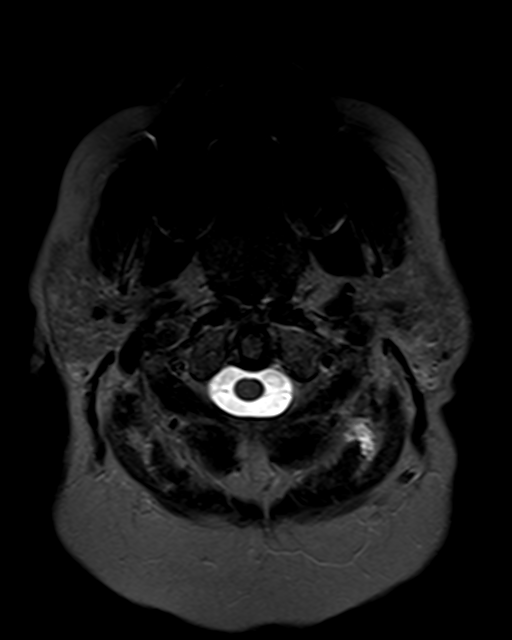
[im 30/30]
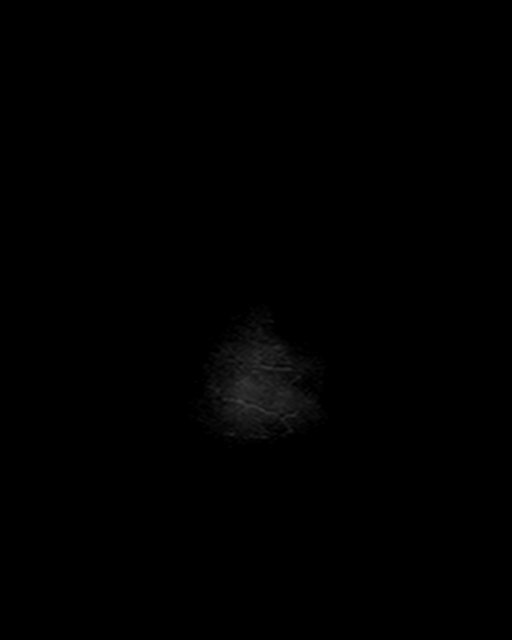

[Series 6: T2 · axial · 5.0mm · 0.45mm/px · z∈[-59,+100]mm · 2 of 30 slices shown (2 of 2)]
[im 1/30]
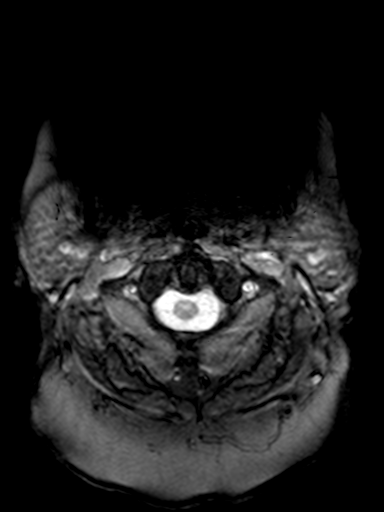
[im 30/30]
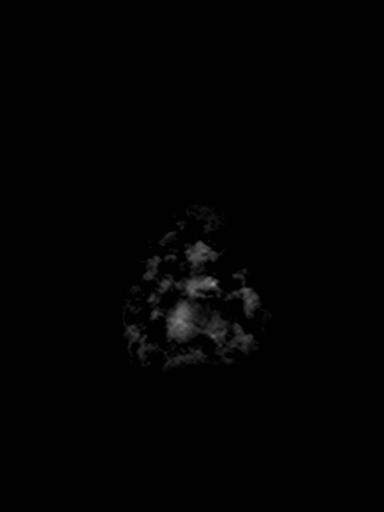

[Series 7: FLAIR · axial · 3.0mm · 0.36mm/px · z∈[-49,+112]mm · 3 of 42 slices shown]
[im 1/42]
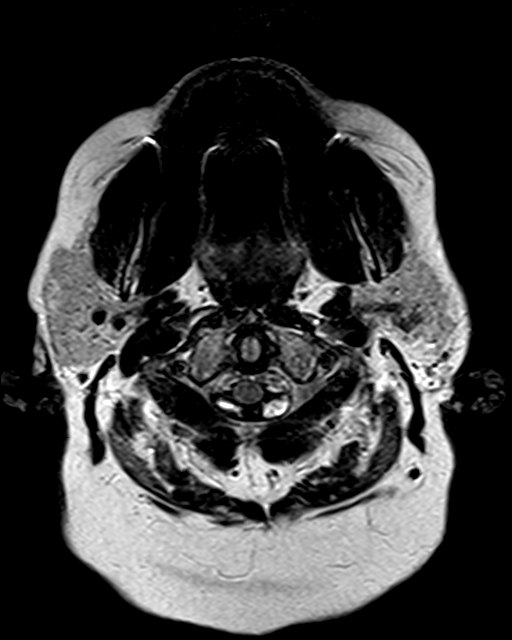
[im 21/42]
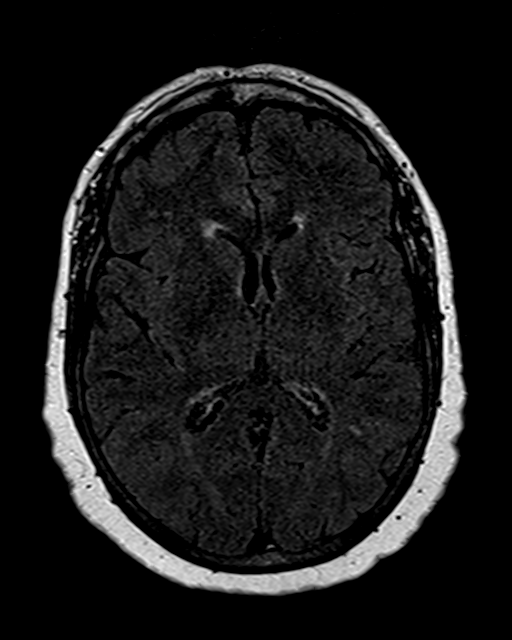
[im 42/42]
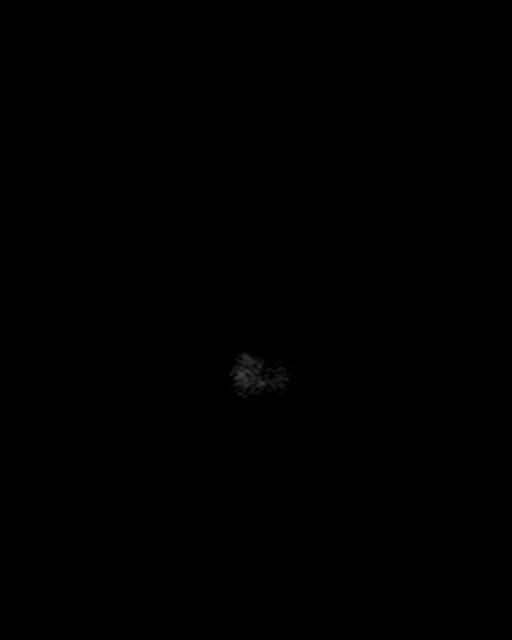

[Series 9: T2 post-contrast · coronal · 5.0mm · 0.72mm/px · 2 of 30 slices shown]
[im 1/30]
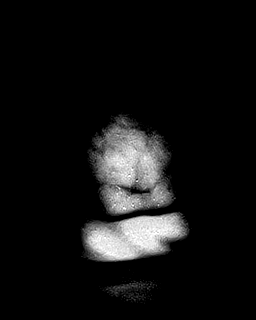
[im 30/30]
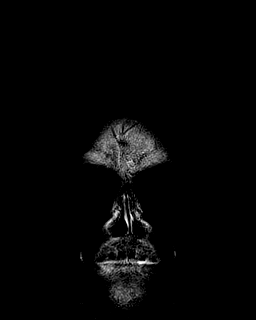

[Series 10: T1 · sagittal · 5.0mm · 0.49mm/px · 2 of 23 slices shown (2 of 2)]
[im 1/23]
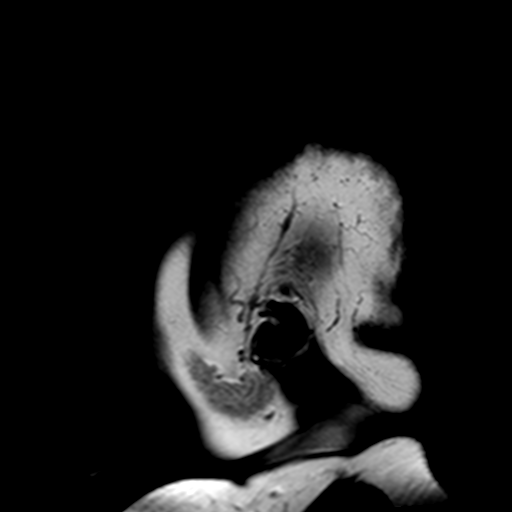
[im 23/23]
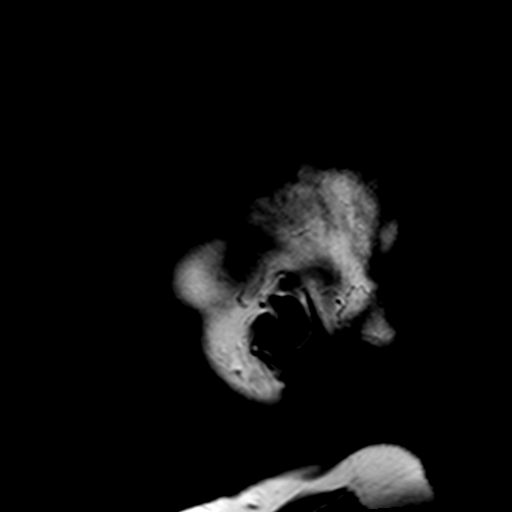

[Series 11: DWI · coronal · 3.0mm · 1.46mm/px · 7 of 88 slices shown (3 of 4)]
[im 1/88]
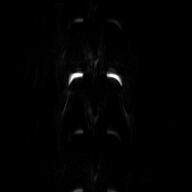
[im 15/88]
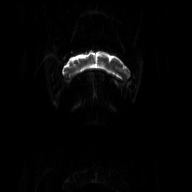
[im 30/88]
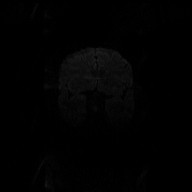
[im 44/88]
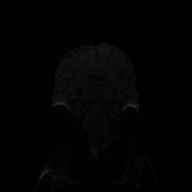
[im 59/88]
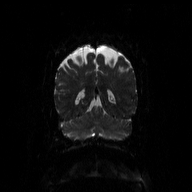
[im 73/88]
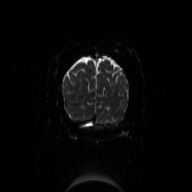
[im 88/88]
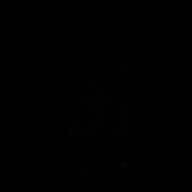

[Series 12: DWI · coronal · 3.0mm · 1.46mm/px · 3 of 45 slices shown (4 of 4)]
[im 1/45]
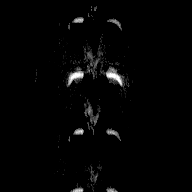
[im 23/45]
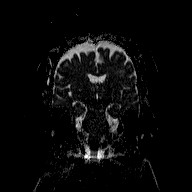
[im 45/45]
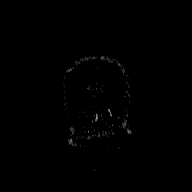

[36 of 48 positions shown; findings below may reference images not displayed]

FINDINGS: Brain: No acute infarction, hemorrhage, hydrocephalus, extra-axial
collection or mass lesion. Remote lacunar infarct in the left
cerebellar hemisphere. Small amount of scattered foci of T2
hyperintensity within the white matter of the cerebral hemispheres,
nonspecific, most likely related to early chronic microangiopathy.

A partial empty sella.

Vascular: Normal flow voids.

Skull and upper cervical spine: Normal marrow signal.

Sinuses/Orbits: Negative.

Other: None.
IMPRESSION: 1. No acute intracranial abnormality.
2. Mild chronic microvascular ischemic changes of the white matter.
3. Remote lacunar infarct in the left cerebellar hemisphere.

## 2021-10-09 MED ORDER — AZITHROMYCIN 250 MG PO TABS: ORAL_TABLET | ORAL | 0 refills | Status: DC

## 2021-10-09 MED ORDER — PREDNISONE 20 MG PO TABS: 40.0000 mg | ORAL_TABLET | Freq: Every day | ORAL | 0 refills | Status: AC

## 2021-10-09 NOTE — Progress Notes (Signed)
Chief Complaint  Patient presents with   Cough    Congestion    Nausea    Salvadore Farber here for URI complaints. Due to COVID-19 pandemic, we are interacting via web portal for an electronic face-to-face visit. I verified patient's ID using 2 identifiers. Patient agreed to proceed with visit via this method. Patient is at home, I am at office. Patient and I are present for visit.   Duration: 1 week  Associated symptoms: rhinorrhea, wheezing, shortness of breath, and coughing, nausea Denies: sinus congestion, sinus pain, itchy watery eyes, ear pain, ear drainage, sore throat, myalgia, and V/D,  Treatment to date: Mucinex DM Sick contacts: No Tested - for covid yesterday.   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)   History of blood transfusion 2006   HLD (hyperlipidemia)    Obesity    Sleep apnea    Thyroid disease     Objective No conversational dyspnea Age appropriate judgment and insight Nml affect and mood  COPD exacerbation (HCC) - Plan: azithromycin (ZITHROMAX) 250 MG tablet, predniSONE (DELTASONE) 20 MG tablet  Exacerbation of chronic issue. 5 d pred burst, Zpak in 2 d if no better.  Continue to push fluids, practice good hand hygiene, cover mouth when coughing. F/u prn. If starting to experience fevers, shaking, or shortness of breath, seek immediate care. Pt voiced understanding and agreement to the plan.  [name] [place], DO 10/09/21 11:24 AM

## 2021-10-27 ENCOUNTER — Other Ambulatory Visit: Payer: Self-pay | Admitting: Family Medicine

## 2021-10-27 DIAGNOSIS — E039 Hypothyroidism, unspecified: Secondary | ICD-10-CM

## 2021-11-01 ENCOUNTER — Ambulatory Visit: Payer: BC Managed Care – PPO | Attending: Internal Medicine

## 2021-11-01 ENCOUNTER — Ambulatory Visit (INDEPENDENT_AMBULATORY_CARE_PROVIDER_SITE_OTHER): Payer: BC Managed Care – PPO | Admitting: Family Medicine

## 2021-11-01 ENCOUNTER — Encounter: Payer: Self-pay | Admitting: Family Medicine

## 2021-11-01 ENCOUNTER — Other Ambulatory Visit (HOSPITAL_BASED_OUTPATIENT_CLINIC_OR_DEPARTMENT_OTHER): Payer: Self-pay

## 2021-11-01 VITALS — BP 130/80 | HR 86 | Temp 98.8°F | Ht 64.0 in | Wt 280.0 lb

## 2021-11-01 DIAGNOSIS — E7849 Other hyperlipidemia: Secondary | ICD-10-CM | POA: Diagnosis not present

## 2021-11-01 DIAGNOSIS — Z23 Encounter for immunization: Secondary | ICD-10-CM

## 2021-11-01 DIAGNOSIS — E038 Other specified hypothyroidism: Secondary | ICD-10-CM

## 2021-11-01 DIAGNOSIS — J449 Chronic obstructive pulmonary disease, unspecified: Secondary | ICD-10-CM

## 2021-11-01 LAB — COMPREHENSIVE METABOLIC PANEL
ALT: 15 U/L (ref 0–35)
AST: 16 U/L (ref 0–37)
Albumin: 3.9 g/dL (ref 3.5–5.2)
Alkaline Phosphatase: 94 U/L (ref 39–117)
BUN: 13 mg/dL (ref 6–23)
CO2: 30 mEq/L (ref 19–32)
Calcium: 9.5 mg/dL (ref 8.4–10.5)
Chloride: 101 mEq/L (ref 96–112)
Creatinine, Ser: 0.54 mg/dL (ref 0.40–1.20)
GFR: 99.41 mL/min (ref >60.00)
Glucose, Bld: 104 mg/dL — ABNORMAL HIGH (ref 70–99)
Potassium: 4.6 mEq/L (ref 3.5–5.1)
Sodium: 138 mEq/L (ref 135–145)
Total Bilirubin: 0.7 mg/dL (ref 0.2–1.2)
Total Protein: 6.6 g/dL (ref 6.0–8.3)

## 2021-11-01 LAB — T4, FREE: Free T4: 1.4 ng/dL (ref 0.60–1.60)

## 2021-11-01 LAB — LIPID PANEL
Cholesterol: 178 mg/dL (ref 0–200)
HDL: 64.9 mg/dL (ref >39.00)
LDL Cholesterol: 93 mg/dL (ref 0–99)
NonHDL: 113.43
Total CHOL/HDL Ratio: 3
Triglycerides: 100 mg/dL (ref 0.0–149.0)
VLDL: 20 mg/dL (ref 0.0–40.0)

## 2021-11-01 LAB — TSH: TSH: 0.44 u[IU]/mL (ref 0.35–5.50)

## 2021-11-01 MED ORDER — FLUTICASONE-SALMETEROL 500-50 MCG/ACT IN AEPB
1.0000 | INHALATION_SPRAY | Freq: Two times a day (BID) | RESPIRATORY_TRACT | 5 refills | Status: DC
  Filled 2021-11-01: qty 60, 30d supply, fill #0
  Filled 2022-01-02: qty 60, 30d supply, fill #1
  Filled 2022-02-25: qty 60, 30d supply, fill #2
  Filled 2022-04-19: qty 60, 30d supply, fill #3
  Filled 2022-05-22: qty 60, 30d supply, fill #4
  Filled 2022-07-04: qty 60, 30d supply, fill #5

## 2021-11-01 NOTE — Progress Notes (Signed)
   Covid-19 Vaccination Clinic  Name:  Pamela [name]    MRN: [id number] DOB: 04-26-1961  11/01/2021  [name] was observed post Covid-19 immunization for 15 minutes without incident. She was provided with Vaccine Information Sheet and instruction to access the V-Safe system.   [name] was instructed to call 911 with any severe reactions post vaccine: Difficulty breathing  Swelling of face and throat  A fast heartbeat  A bad rash all over body  Dizziness and weakness   Immunizations Administered     Name Date Dose VIS Date Route   Pfizer Covid-19 Vaccine Bivalent Booster 11/01/2021  9:30 AM 0.3 mL 07/26/2021 Intramuscular   Manufacturer: [employer], [employer]   Lot: [id number]   NDC: [phone]

## 2021-11-01 NOTE — Progress Notes (Signed)
Chief Complaint  Patient presents with   Follow-up    6 month    Pamela [name] is a 60 y.o. female here for COPD.  Currently treated with Spiriva and Wixela/Advair. Compliance is excellent. Uses rescue inhaler 1-2 times per d. Reports breathing is good overall. No nighttime awakenings.  Hypothyroidism Patient presents for follow-up of hypothyroidism.  Reports compliance with medication- levothyroxine 150 mcg/d. Current symptoms include: denies fatigue, weight changes, heat/cold intolerance, bowel/skin changes or CVS symptoms She believes her dose should be not significantly changed  Hyperlipidemia Patient presents for hyperlipidemia follow up. Currently being treated with Lipitor 20 mg/d and compliance with treatment thus far has been good. She denies myalgias. She is adhering to a healthy diet. Exercise: none currently  The patient is not known to have coexisting coronary artery disease. No CP or SOB.   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)   History of blood transfusion 2006   HLD (hyperlipidemia)    Obesity    Sleep apnea    Thyroid disease     BP 130/80   Pulse 86   Temp 98.8 F (37.1 C) (Oral)   Ht 5\' 4"  (1.626 m)   Wt 280 lb (127 kg)   SpO2 95%   BMI 48.06 kg/m  Gen: Awake, alert HEENT: MMM, nares patent, no polyps Heart: RRR Lungs: faint exp wheezing, no accessory muscle use Msk: No clubbing or cyanosis Psych: Age appropriate judgment and insight  Chronic obstructive pulmonary disease, unspecified COPD type (HCC)  Other specified hypothyroidism - Plan: T4, free, TSH  Other hyperlipidemia - Plan: Lipid panel, Comprehensive metabolic panel  Need for vaccination against  Streptococcus pneumoniae - Plan: Pneumococcal conjugate vaccine 20-valent (Prevnar 20)  Chronic, unstable. Increase dose of Wixela to 500-50 mcg bid. Cont Spiriva. PCV20 today. If not improving, will refer to pulm vs considering theophylline.  Chronic, stable. Cont Synthroid 150 mcg/d. Chronic, stable. Cont Lipitor 20 mg/d. Counseled on diet/exercise.  F/u in 6 mo. The patient voiced understanding and agreement to the plan.  [place], DO 11/01/21 10:01 AM

## 2021-11-01 NOTE — Patient Instructions (Addendum)
The new Shingrix vaccine (for shingles) is a 2 shot series. It can make people feel low energy, achy and almost like they have the flu for 48 hours after injection. Please plan accordingly when deciding on when to get this shot. Call our office for a nurse visit appointment to get this. The second shot of the series is less severe regarding the side effects, but it still lasts 48 hours.   I recommend getting the updated bivalent covid vaccination booster at your convenience.   Give [name] 2-3 business days to get the results of your labs back.   Keep the diet clean and stay active.  Aim to do some physical exertion for 150 minutes per week. This is typically divided into 5 days per week, 30 minutes per day. The activity should be enough to get your heart rate up. Anything is better than nothing if you have time constraints.  Let us know if you need anything.

## 2021-11-02 ENCOUNTER — Other Ambulatory Visit (HOSPITAL_BASED_OUTPATIENT_CLINIC_OR_DEPARTMENT_OTHER): Payer: Self-pay

## 2021-11-02 MED ORDER — PFIZER COVID-19 VAC BIVALENT 30 MCG/0.3ML IM SUSP
INTRAMUSCULAR | 0 refills | Status: DC
  Filled 2021-11-02: qty 0.3, 1d supply, fill #0

## 2021-11-14 ENCOUNTER — Encounter: Payer: Self-pay | Admitting: Family Medicine

## 2021-11-14 ENCOUNTER — Other Ambulatory Visit (HOSPITAL_BASED_OUTPATIENT_CLINIC_OR_DEPARTMENT_OTHER): Payer: Self-pay

## 2021-11-14 MED ORDER — ALBUTEROL SULFATE HFA 108 (90 BASE) MCG/ACT IN AERS
INHALATION_SPRAY | RESPIRATORY_TRACT | 5 refills | Status: DC
  Filled 2021-11-14: qty 18, 17d supply, fill #0
  Filled 2022-01-02 – 2022-01-03 (×2): qty 18, 17d supply, fill #1
  Filled 2022-07-03: qty 6.7, 25d supply, fill #2
  Filled 2022-09-03: qty 6.7, 25d supply, fill #3
  Filled 2022-09-24: qty 6.7, 25d supply, fill #4

## 2021-11-29 ENCOUNTER — Other Ambulatory Visit (HOSPITAL_BASED_OUTPATIENT_CLINIC_OR_DEPARTMENT_OTHER): Payer: Self-pay

## 2021-11-29 ENCOUNTER — Encounter: Payer: Self-pay | Admitting: Family Medicine

## 2021-11-29 MED ORDER — SPIRIVA HANDIHALER 18 MCG IN CAPS
ORAL_CAPSULE | RESPIRATORY_TRACT | 3 refills | Status: DC
  Filled 2021-11-29: qty 30, 30d supply, fill #0
  Filled 2022-01-02: qty 30, 30d supply, fill #1

## 2021-12-01 ENCOUNTER — Other Ambulatory Visit: Payer: Self-pay | Admitting: Family Medicine

## 2021-12-01 ENCOUNTER — Other Ambulatory Visit (HOSPITAL_BASED_OUTPATIENT_CLINIC_OR_DEPARTMENT_OTHER): Payer: Self-pay

## 2021-12-01 ENCOUNTER — Encounter: Payer: Self-pay | Admitting: Family Medicine

## 2021-12-01 ENCOUNTER — Telehealth: Payer: Self-pay

## 2021-12-01 MED ORDER — SEMAGLUTIDE-WEIGHT MANAGEMENT 0.5 MG/0.5ML ~~LOC~~ SOAJ
0.5000 mg | SUBCUTANEOUS | 0 refills | Status: AC
  Filled 2021-12-01 – 2022-01-03 (×3): qty 2, 28d supply, fill #0

## 2021-12-01 NOTE — Telephone Encounter (Signed)
PA initiated via Covermymeds; KEY: [id number]. Awaiting determination.

## 2021-12-01 NOTE — Telephone Encounter (Signed)
PA approved. Effective 12/01/2021 to 07/01/2022.

## 2021-12-05 ENCOUNTER — Other Ambulatory Visit (HOSPITAL_BASED_OUTPATIENT_CLINIC_OR_DEPARTMENT_OTHER): Payer: Self-pay

## 2021-12-25 ENCOUNTER — Other Ambulatory Visit (HOSPITAL_BASED_OUTPATIENT_CLINIC_OR_DEPARTMENT_OTHER): Payer: Self-pay

## 2022-01-01 ENCOUNTER — Other Ambulatory Visit (HOSPITAL_BASED_OUTPATIENT_CLINIC_OR_DEPARTMENT_OTHER): Payer: Self-pay

## 2022-01-02 ENCOUNTER — Other Ambulatory Visit (HOSPITAL_BASED_OUTPATIENT_CLINIC_OR_DEPARTMENT_OTHER): Payer: Self-pay

## 2022-01-02 ENCOUNTER — Other Ambulatory Visit: Payer: Self-pay | Admitting: Family Medicine

## 2022-01-03 ENCOUNTER — Other Ambulatory Visit: Payer: Self-pay | Admitting: Family Medicine

## 2022-01-03 ENCOUNTER — Other Ambulatory Visit (HOSPITAL_BASED_OUTPATIENT_CLINIC_OR_DEPARTMENT_OTHER): Payer: Self-pay

## 2022-01-03 MED ORDER — SPIRIVA HANDIHALER 18 MCG IN CAPS
ORAL_CAPSULE | RESPIRATORY_TRACT | 3 refills | Status: DC
  Filled 2022-01-03: qty 30, 30d supply, fill #0
  Filled 2022-05-22: qty 30, 30d supply, fill #1

## 2022-01-05 ENCOUNTER — Other Ambulatory Visit (HOSPITAL_BASED_OUTPATIENT_CLINIC_OR_DEPARTMENT_OTHER): Payer: Self-pay

## 2022-01-05 MED ORDER — CITALOPRAM HYDROBROMIDE 40 MG PO TABS
ORAL_TABLET | ORAL | 4 refills | Status: DC
  Filled 2022-01-05 – 2022-02-28 (×2): qty 30, 30d supply, fill #0
  Filled 2022-05-22: qty 30, 30d supply, fill #1

## 2022-01-08 ENCOUNTER — Other Ambulatory Visit (HOSPITAL_BASED_OUTPATIENT_CLINIC_OR_DEPARTMENT_OTHER): Payer: Self-pay

## 2022-01-08 MED ORDER — MODAFINIL 200 MG PO TABS
ORAL_TABLET | ORAL | 4 refills | Status: DC
  Filled 2022-01-08 – 2022-05-22 (×2): qty 30, 30d supply, fill #0

## 2022-01-08 MED ORDER — CITALOPRAM HYDROBROMIDE 40 MG PO TABS
ORAL_TABLET | ORAL | 4 refills | Status: DC
  Filled 2022-01-08: qty 30, 30d supply, fill #0

## 2022-01-08 MED ORDER — HYDROXYZINE PAMOATE 25 MG PO CAPS
ORAL_CAPSULE | ORAL | 4 refills | Status: DC
  Filled 2022-01-08: qty 90, 30d supply, fill #0

## 2022-01-16 ENCOUNTER — Other Ambulatory Visit (HOSPITAL_BASED_OUTPATIENT_CLINIC_OR_DEPARTMENT_OTHER): Payer: Self-pay

## 2022-02-26 ENCOUNTER — Other Ambulatory Visit (HOSPITAL_BASED_OUTPATIENT_CLINIC_OR_DEPARTMENT_OTHER): Payer: Self-pay

## 2022-03-01 ENCOUNTER — Other Ambulatory Visit (HOSPITAL_BASED_OUTPATIENT_CLINIC_OR_DEPARTMENT_OTHER): Payer: Self-pay

## 2022-03-05 ENCOUNTER — Other Ambulatory Visit (HOSPITAL_BASED_OUTPATIENT_CLINIC_OR_DEPARTMENT_OTHER): Payer: Self-pay

## 2022-04-06 ENCOUNTER — Telehealth: Payer: Self-pay | Admitting: *Deleted

## 2022-04-06 NOTE — Telephone Encounter (Signed)
[facility] is sending a request for Pamela [name] 1mg .  Pt started back in January and I it is not on her list. Do you want to send in? ?

## 2022-04-07 MED ORDER — SEMAGLUTIDE-WEIGHT MANAGEMENT 1 MG/0.5ML ~~LOC~~ SOAJ
1.0000 mg | SUBCUTANEOUS | 1 refills | Status: DC
  Filled 2022-04-07: qty 2, 28d supply, fill #0
  Filled 2022-05-01: qty 2, 28d supply, fill #1

## 2022-04-07 NOTE — Telephone Encounter (Signed)
Sent. Please make sure her 6 mo follow up/CPE is scheduled for June. Ty.  ?

## 2022-04-09 ENCOUNTER — Other Ambulatory Visit (HOSPITAL_BASED_OUTPATIENT_CLINIC_OR_DEPARTMENT_OTHER): Payer: Self-pay

## 2022-04-09 NOTE — Telephone Encounter (Signed)
Called the patient and scheduled for May 14, 2022 at 10:30 AM. ?

## 2022-04-18 ENCOUNTER — Other Ambulatory Visit: Payer: Self-pay | Admitting: Family Medicine

## 2022-04-18 DIAGNOSIS — E7849 Other hyperlipidemia: Secondary | ICD-10-CM

## 2022-04-18 DIAGNOSIS — E039 Hypothyroidism, unspecified: Secondary | ICD-10-CM

## 2022-04-18 MED ORDER — LEVOTHYROXINE SODIUM 150 MCG PO TABS: ORAL_TABLET | ORAL | 0 refills | Status: DC

## 2022-04-18 MED ORDER — ATORVASTATIN CALCIUM 20 MG PO TABS: ORAL_TABLET | ORAL | 0 refills | Status: DC

## 2022-04-19 ENCOUNTER — Other Ambulatory Visit (HOSPITAL_BASED_OUTPATIENT_CLINIC_OR_DEPARTMENT_OTHER): Payer: Self-pay

## 2022-04-19 ENCOUNTER — Other Ambulatory Visit: Payer: Self-pay | Admitting: Family Medicine

## 2022-04-24 ENCOUNTER — Other Ambulatory Visit (HOSPITAL_BASED_OUTPATIENT_CLINIC_OR_DEPARTMENT_OTHER): Payer: Self-pay

## 2022-05-03 ENCOUNTER — Other Ambulatory Visit (HOSPITAL_BASED_OUTPATIENT_CLINIC_OR_DEPARTMENT_OTHER): Payer: Self-pay

## 2022-05-07 ENCOUNTER — Other Ambulatory Visit (HOSPITAL_BASED_OUTPATIENT_CLINIC_OR_DEPARTMENT_OTHER): Payer: Self-pay

## 2022-05-14 ENCOUNTER — Encounter: Payer: BC Managed Care – PPO | Admitting: Family Medicine

## 2022-05-17 ENCOUNTER — Other Ambulatory Visit (HOSPITAL_BASED_OUTPATIENT_CLINIC_OR_DEPARTMENT_OTHER): Payer: Self-pay

## 2022-05-21 ENCOUNTER — Other Ambulatory Visit: Payer: Self-pay | Admitting: Family Medicine

## 2022-05-21 ENCOUNTER — Encounter: Payer: Self-pay | Admitting: Family Medicine

## 2022-05-22 ENCOUNTER — Other Ambulatory Visit (HOSPITAL_BASED_OUTPATIENT_CLINIC_OR_DEPARTMENT_OTHER): Payer: Self-pay

## 2022-05-22 MED ORDER — WEGOVY 1.7 MG/0.75ML ~~LOC~~ SOAJ
1.7000 mg | SUBCUTANEOUS | 0 refills | Status: DC
  Filled 2022-05-22: qty 3, 28d supply, fill #0

## 2022-05-23 ENCOUNTER — Other Ambulatory Visit (HOSPITAL_BASED_OUTPATIENT_CLINIC_OR_DEPARTMENT_OTHER): Payer: Self-pay

## 2022-05-24 ENCOUNTER — Other Ambulatory Visit (HOSPITAL_BASED_OUTPATIENT_CLINIC_OR_DEPARTMENT_OTHER): Payer: Self-pay

## 2022-05-25 ENCOUNTER — Other Ambulatory Visit (HOSPITAL_BASED_OUTPATIENT_CLINIC_OR_DEPARTMENT_OTHER): Payer: Self-pay

## 2022-05-28 ENCOUNTER — Other Ambulatory Visit (HOSPITAL_BASED_OUTPATIENT_CLINIC_OR_DEPARTMENT_OTHER): Payer: Self-pay

## 2022-06-01 ENCOUNTER — Other Ambulatory Visit (HOSPITAL_BASED_OUTPATIENT_CLINIC_OR_DEPARTMENT_OTHER): Payer: Self-pay

## 2022-06-04 ENCOUNTER — Other Ambulatory Visit (HOSPITAL_BASED_OUTPATIENT_CLINIC_OR_DEPARTMENT_OTHER): Payer: Self-pay

## 2022-06-05 ENCOUNTER — Other Ambulatory Visit (HOSPITAL_BASED_OUTPATIENT_CLINIC_OR_DEPARTMENT_OTHER): Payer: Self-pay

## 2022-06-06 ENCOUNTER — Other Ambulatory Visit (HOSPITAL_BASED_OUTPATIENT_CLINIC_OR_DEPARTMENT_OTHER): Payer: Self-pay

## 2022-06-07 ENCOUNTER — Other Ambulatory Visit (HOSPITAL_BASED_OUTPATIENT_CLINIC_OR_DEPARTMENT_OTHER): Payer: Self-pay

## 2022-06-08 ENCOUNTER — Other Ambulatory Visit (HOSPITAL_BASED_OUTPATIENT_CLINIC_OR_DEPARTMENT_OTHER): Payer: Self-pay

## 2022-06-08 ENCOUNTER — Ambulatory Visit (INDEPENDENT_AMBULATORY_CARE_PROVIDER_SITE_OTHER): Payer: BC Managed Care – PPO | Admitting: Family Medicine

## 2022-06-08 ENCOUNTER — Encounter: Payer: Self-pay | Admitting: Family Medicine

## 2022-06-08 VITALS — BP 130/80 | HR 96 | Temp 98.3°F | Ht 64.0 in | Wt 270.0 lb

## 2022-06-08 DIAGNOSIS — R109 Unspecified abdominal pain: Secondary | ICD-10-CM

## 2022-06-08 MED ORDER — DICYCLOMINE HCL 10 MG PO CAPS
ORAL_CAPSULE | ORAL | 0 refills | Status: DC
  Filled 2022-06-08: qty 60, 15d supply, fill #0

## 2022-06-08 NOTE — Patient Instructions (Signed)
Try to avoid dairy.   Let us know if you need anything.

## 2022-06-08 NOTE — Progress Notes (Signed)
Chief Complaint  Patient presents with   Abdominal Pain    Unable to urinate well Stomach sounds Diarrhea     Pamela [name] is here for abdominal pain.  Duration: 4 months 1-2 ties per month Nighttime awakenings? No Bleeding? No Weight loss? Yes but unintentional w Wegovy Locations: lower sides Palliation: none Provocation: dairy? Associated symptoms:  bloating , diarrhea Denies: fever, nausea, vomiting, and inability to keep down fluids Treatment to date: Gas X, ibuprofen  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)   History of blood transfusion 2006   HLD (hyperlipidemia)    Obesity    Sleep apnea    Thyroid disease     BP 130/80   Pulse 96   Temp 98.3 F (36.8 C) (Oral)   Ht 5\' 4"  (1.626 m)   Wt 270 lb (122.5 kg)   SpO2 97%   BMI 46.35 kg/m  Gen.: Awake, alert, appears stated age HEENT: Mucous membranes moist without mucosal lesions Heart: RRR Lungs: Clear auscultation bilaterally, no rales or wheezing, normal effort without accessory muscle use. Abdomen: Bowel sounds are present. Abdomen is soft, nontender, nondistended, no masses or organomegaly.  She has abdominal prominence/protuberance over the right abdominal wall.  Negative Murphy's, Rovsing's, McBurney's, and Carnett's sign. Psych: Age appropriate judgment and insight. Normal mood and affect.  Abdominal cramping - Plan: dicyclomine (BENTYL) 10 MG capsule  She may have lactose intolerance.  Bentyl as needed for cramping.  She will abstain from dairy products and see how she does.  No red flag signs or symptoms. Pt voiced understanding and agreement to the plan.  [place], DO 06/08/22 4:36 PM

## 2022-06-11 ENCOUNTER — Other Ambulatory Visit (HOSPITAL_BASED_OUTPATIENT_CLINIC_OR_DEPARTMENT_OTHER): Payer: Self-pay

## 2022-06-12 ENCOUNTER — Other Ambulatory Visit (HOSPITAL_BASED_OUTPATIENT_CLINIC_OR_DEPARTMENT_OTHER): Payer: Self-pay

## 2022-07-03 ENCOUNTER — Other Ambulatory Visit: Payer: Self-pay | Admitting: Family Medicine

## 2022-07-03 ENCOUNTER — Telehealth: Payer: Self-pay | Admitting: Family Medicine

## 2022-07-03 ENCOUNTER — Other Ambulatory Visit (HOSPITAL_BASED_OUTPATIENT_CLINIC_OR_DEPARTMENT_OTHER): Payer: Self-pay

## 2022-07-03 MED ORDER — WEGOVY 1.7 MG/0.75ML ~~LOC~~ SOAJ
1.7000 mg | SUBCUTANEOUS | 0 refills | Status: DC
  Filled 2022-07-03: qty 3, 28d supply, fill #0

## 2022-07-03 NOTE — Telephone Encounter (Signed)
Initiated PA for [facility] KEY:  [id number] Waiting response from cover my meds.

## 2022-07-04 ENCOUNTER — Other Ambulatory Visit (HOSPITAL_BASED_OUTPATIENT_CLINIC_OR_DEPARTMENT_OTHER): Payer: Self-pay

## 2022-07-04 ENCOUNTER — Encounter (INDEPENDENT_AMBULATORY_CARE_PROVIDER_SITE_OTHER): Payer: Self-pay

## 2022-07-04 NOTE — Telephone Encounter (Signed)
PA approved Patient informed (called left a detailed message)

## 2022-07-04 NOTE — Telephone Encounter (Signed)
Medication approved from 07/03/2022 to 02/01/2023. As long as the patient remains covered by [facility] and there are no changes to her plan benefits.

## 2022-07-23 ENCOUNTER — Encounter: Payer: BC Managed Care – PPO | Admitting: Family Medicine

## 2022-07-23 ENCOUNTER — Encounter: Payer: Self-pay | Admitting: Family Medicine

## 2022-07-23 ENCOUNTER — Telehealth: Payer: Self-pay

## 2022-07-23 NOTE — Telephone Encounter (Signed)
Caller Name Vertis Bauder Caller Phone Number [phone] Patient Name Mackinzie Vuncannon Patient DOB [phone] Call Type Message Only Information Provided Reason for Call Request to Reschedule Office Appointment Initial Comment Caller is scheduled at 830 AM and asks if there are any cancellations, any appts later in the morning she can come instead. No sxs. Routine exam and bloodwork. Patient request to speak to RN No Additional Comment Office hours provided. declined triage. Standing by for callback to reschedule. Disp. Time Disposition Final User 07/23/2022 7:22:48 AM General Information Provided Yes Gokounous, Erin Call Closed By: Tomie China Transaction Date/Time: 07/23/2022 7:19:01 AM (ET)

## 2022-08-02 ENCOUNTER — Other Ambulatory Visit: Payer: Self-pay | Admitting: Family Medicine

## 2022-08-02 DIAGNOSIS — E7849 Other hyperlipidemia: Secondary | ICD-10-CM

## 2022-08-21 ENCOUNTER — Other Ambulatory Visit: Payer: Self-pay | Admitting: Family Medicine

## 2022-08-21 ENCOUNTER — Other Ambulatory Visit (HOSPITAL_BASED_OUTPATIENT_CLINIC_OR_DEPARTMENT_OTHER): Payer: Self-pay

## 2022-08-21 DIAGNOSIS — E039 Hypothyroidism, unspecified: Secondary | ICD-10-CM

## 2022-08-21 MED ORDER — WEGOVY 1.7 MG/0.75ML ~~LOC~~ SOAJ
1.7000 mg | SUBCUTANEOUS | 0 refills | Status: DC
  Filled 2022-08-21: qty 3, 28d supply, fill #0

## 2022-08-21 MED ORDER — FLUTICASONE-SALMETEROL 500-50 MCG/ACT IN AEPB
1.0000 | INHALATION_SPRAY | Freq: Two times a day (BID) | RESPIRATORY_TRACT | 5 refills | Status: DC
  Filled 2022-08-21: qty 60, 30d supply, fill #0

## 2022-08-21 MED ORDER — LEVOTHYROXINE SODIUM 150 MCG PO TABS
150.0000 ug | ORAL_TABLET | Freq: Every day | ORAL | 0 refills | Status: DC
  Filled 2022-08-21: qty 90, 90d supply, fill #0

## 2022-08-24 ENCOUNTER — Ambulatory Visit (INDEPENDENT_AMBULATORY_CARE_PROVIDER_SITE_OTHER): Payer: BC Managed Care – PPO | Admitting: Family Medicine

## 2022-08-24 ENCOUNTER — Other Ambulatory Visit (HOSPITAL_BASED_OUTPATIENT_CLINIC_OR_DEPARTMENT_OTHER): Payer: Self-pay

## 2022-08-24 ENCOUNTER — Encounter: Payer: Self-pay | Admitting: Family Medicine

## 2022-08-24 VITALS — BP 138/84 | HR 90 | Temp 98.3°F | Ht 64.0 in | Wt 274.4 lb

## 2022-08-24 DIAGNOSIS — K439 Ventral hernia without obstruction or gangrene: Secondary | ICD-10-CM

## 2022-08-24 DIAGNOSIS — E559 Vitamin D deficiency, unspecified: Secondary | ICD-10-CM | POA: Diagnosis not present

## 2022-08-24 DIAGNOSIS — E038 Other specified hypothyroidism: Secondary | ICD-10-CM | POA: Diagnosis not present

## 2022-08-24 DIAGNOSIS — J449 Chronic obstructive pulmonary disease, unspecified: Secondary | ICD-10-CM | POA: Diagnosis not present

## 2022-08-24 DIAGNOSIS — Z0000 Encounter for general adult medical examination without abnormal findings: Secondary | ICD-10-CM | POA: Diagnosis not present

## 2022-08-24 LAB — COMPREHENSIVE METABOLIC PANEL
ALT: 12 U/L (ref 0–35)
AST: 13 U/L (ref 0–37)
Albumin: 3.9 g/dL (ref 3.5–5.2)
Alkaline Phosphatase: 100 U/L (ref 39–117)
BUN: 13 mg/dL (ref 6–23)
CO2: 27 mEq/L (ref 19–32)
Calcium: 9.1 mg/dL (ref 8.4–10.5)
Chloride: 101 mEq/L (ref 96–112)
Creatinine, Ser: 0.65 mg/dL (ref 0.40–1.20)
GFR: 94.52 mL/min (ref >60.00)
Glucose, Bld: 87 mg/dL (ref 70–99)
Potassium: 4.2 mEq/L (ref 3.5–5.1)
Sodium: 139 mEq/L (ref 135–145)
Total Bilirubin: 0.4 mg/dL (ref 0.2–1.2)
Total Protein: 6.6 g/dL (ref 6.0–8.3)

## 2022-08-24 LAB — CBC
HCT: 42.9 % (ref 36.0–46.0)
Hemoglobin: 14.4 g/dL (ref 12.0–15.0)
MCHC: 33.5 g/dL (ref 30.0–36.0)
MCV: 87.5 fl (ref 78.0–100.0)
Platelets: 312 10*3/uL (ref 150.0–400.0)
RBC: 4.91 Mil/uL (ref 3.87–5.11)
RDW: 14.4 % (ref 11.5–15.5)
WBC: 7.6 10*3/uL (ref 4.0–10.5)

## 2022-08-24 LAB — TSH: TSH: 0.21 u[IU]/mL — ABNORMAL LOW (ref 0.35–5.50)

## 2022-08-24 LAB — LIPID PANEL
Cholesterol: 157 mg/dL (ref 0–200)
HDL: 52.5 mg/dL (ref >39.00)
LDL Cholesterol: 79 mg/dL (ref 0–99)
NonHDL: 104.72
Total CHOL/HDL Ratio: 3
Triglycerides: 130 mg/dL (ref 0.0–149.0)
VLDL: 26 mg/dL (ref 0.0–40.0)

## 2022-08-24 LAB — VITAMIN D 25 HYDROXY (VIT D DEFICIENCY, FRACTURES): VITD: 24.73 ng/mL — ABNORMAL LOW (ref 30.00–100.00)

## 2022-08-24 LAB — T4, FREE: Free T4: 1.34 ng/dL (ref 0.60–1.60)

## 2022-08-24 MED ORDER — METHYLPREDNISOLONE ACETATE 80 MG/ML IJ SUSP
80.0000 mg | Freq: Once | INTRAMUSCULAR | Status: AC
  Administered 2022-08-24: 80 mg via INTRAMUSCULAR

## 2022-08-24 MED ORDER — BREZTRI AEROSPHERE 160-9-4.8 MCG/ACT IN AERO
INHALATION_SPRAY | RESPIRATORY_TRACT | 5 refills | Status: DC
  Filled 2022-08-24: qty 10.7, 30d supply, fill #0
  Filled 2022-09-24: qty 10.7, 30d supply, fill #1
  Filled 2022-12-09: qty 10.7, 30d supply, fill #2
  Filled 2022-12-31 – 2023-01-09 (×3): qty 10.7, 30d supply, fill #3
  Filled 2023-03-29: qty 10.7, 30d supply, fill #4
  Filled 2023-04-22: qty 10.7, 30d supply, fill #5

## 2022-08-24 NOTE — Progress Notes (Signed)
Chief Complaint  Patient presents with   Annual Exam     Well Woman Pamela [name] is here for a complete physical.   Her last physical was >1 year ago.  Current diet: in general, diet could be better. Current exercise: swimming. Weight is stable and she denies fatigue out of ordinary. Patient is postmenopausal. Seatbelt? Yes Advanced directive? No  Health Maintenance Pap/HPV- No Mammogram- Yes Colon cancer screening-Yes Shingrix- No Tetanus- Yes Hep C screening- Yes HIV screening- Yes  COPD Hx of COPD, poorly controlled. Using SABA daily. Compliant w Spiriva and Advair. No AE's. Wheezing, coughing and having DOE. Started smoking again after Vape pen broke.    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)   History of blood transfusion 2006   HLD (hyperlipidemia)    Obesity    Sleep apnea    Thyroid disease      Past Surgical History:  Procedure Laterality Date   AUGMENTATION MAMMAPLASTY Bilateral 1987   [place]   BREAST BIOPSY     INSERTION OF MESH N/A 07/16/2019   Procedure: Insertion Of Mesh;  Surgeon: Abigail Miyamoto, MD;  Location[facility] OR;  Service: General;  Laterality: N/A;   LAPAROSCOPIC GASTRIC BANDING  2010   VENTRAL HERNIA REPAIR N/A 07/16/2019   Procedure: VENTRAL INCISIONAL HERNIA REPAIR WITH MESH;  Surgeon: Abigail Miyamoto, MD;  Location: [facility] OR;  Service: General;  Laterality: N/A;    Medications  Current Outpatient Medications on File Prior to Visit  Medication Sig Dispense Refill   albuterol (VENTOLIN HFA) 108 (90 Base) MCG/ACT inhaler INHALE 1 TO 2 PUFFS INTO THE LUNGS EVERY 6 HOURS AS NEEDED FOR WHEEZING OR SHORTNESS OF BREATH 18 g 5   aspirin 81 MG EC tablet Take 1 tablet (81  mg total) by mouth daily. Swallow whole. 30 tablet 12   atorvastatin (LIPITOR) 20 MG tablet TAKE 1 TABLET(20 MG) BY MOUTH AT BEDTIME 90 tablet 0   Cholecalciferol (VITAMIN D) 125 MCG (5000 UT) CAPS Take 1 capsule by mouth daily. 30 capsule 0   clobetasol (TEMOVATE) 0.05 % external solution Apply 1 application topically 2 (two) times daily as needed (irritation). 50 mL 1   COVID-19 mRNA bivalent vaccine, Pfizer, (PFIZER COVID-19 VAC BIVALENT) injection Inject into the muscle. 0.3 mL 0   dicyclomine (BENTYL) 10 MG capsule Take 1 tablet by mouth every 6 hours as needed for abdominal cramping. 60 capsule 0   fluticasone-salmeterol (ADVAIR DISKUS) 500-50 MCG/ACT AEPB Inhale 1 puff by mouth into the lungs in the morning and at bedtime. 60 each 5   hydrOXYzine (VISTARIL) 25 MG capsule Take 1 capsule by mouth 3 times daily as needed 90 capsule 4   levothyroxine (SYNTHROID) 150 MCG tablet Take 1 tablet (150 mcg total) by mouth daily. 90 tablet 0   Melatonin 5 MG TABS Take 5 mg by mouth at bedtime.     modafinil (PROVIGIL) 200 MG tablet Take 1 tablet by mouth every morning 30 tablet 4   omeprazole (PRILOSEC) 40 MG capsule TAKE 1 CAPSULE(40 MG) BY MOUTH DAILY AS NEEDED FOR HEARTBURN 90 capsule 1  Semaglutide-Weight Management (WEGOVY) 1.7 MG/0.75ML SOAJ Inject 1.7 mg into the skin once a week. 3 mL 0   SPIRIVA HANDIHALER 18 MCG inhalation capsule INHALE THE CONTENTS OF 1 CAPSULE VIA INHALATION DEVICE EVERY DAY 30 capsule 3    Allergies No Known Allergies  Review of Systems: Constitutional:  no unexpected weight changes Eye:  no recent significant change in vision Ear/Nose/Mouth/Throat:  Ears:  no recent change in hearing Nose/Mouth/Throat:  no complaints of nasal congestion, no sore throat Cardiovascular: no chest pain Respiratory:  +shortness of breath Gastrointestinal:  no abdominal pain, no change in bowel habits GU:  Female: negative for dysuria or pelvic pain Musculoskeletal/Extremities:  no  pain of the joints Integumentary (Skin/Breast):  no abnormal skin lesions reported Neurologic:  no headaches Endocrine:  denies fatigue  Exam BP 138/84   Pulse 90   Temp 98.3 F (36.8 C) (Oral)   Ht 5\' 4"  (1.626 m)   Wt 274 lb 6 oz (124.5 kg)   SpO2 94%   BMI 47.10 kg/m  General:  well developed, well nourished, in no apparent distress Skin:  no significant moles, warts, or growths Head:  no masses, lesions, or tenderness Eyes:  pupils equal and round, sclera anicteric without injection Ears:  canals without lesions, TMs shiny without retraction, no obvious effusion, no erythema Nose:  nares patent, mucosa normal, and no drainage  Throat/Pharynx:  lips and gingiva without lesion; tongue and uvula midline; non-inflamed pharynx; no exudates or postnasal drainage Neck: neck supple without adenopathy, thyromegaly, or masses Lungs:  diffuse exp wheezes; otherwise breath sounds equal bilaterally, no respiratory distress Cardio:  regular rate and rhythm, no LE edema Abdomen:  abdomen soft, nontender; bowel sounds normal; large mass that feels like bowel over R portion of abd where her ventral hernia was.  Genital: Defer to GYN Musculoskeletal:  symmetrical muscle groups noted without atrophy or deformity Extremities:  no clubbing, cyanosis, or edema, no deformities, no skin discoloration Neuro:  gait normal; deep tendon reflexes normal and symmetric Psych: well oriented with normal range of affect and appropriate judgment/insight  Assessment and Plan  Well adult exam - Plan: CBC, Comprehensive metabolic panel, Lipid panel  Other specified hypothyroidism - Plan: TSH, T4, free  Vitamin D deficiency - Plan: VITAMIN D 25 Hydroxy (Vit-D Deficiency, Fractures)  Chronic obstructive pulmonary disease, unspecified COPD type ([place]) - Plan: Budeson-Glycopyrrol-Formoterol (BREZTRI AEROSPHERE) 160-9-4.8 MCG/ACT AERO   Well 61 y.o. female. Counseled on diet and exercise. Other orders as  above. Politely declined flu shot.  Advanced directive form provided today.  COPD: Chronic, uncontrolled. Depomedrol today as she has a mild exacerbation. Needs to stop smoking. Change Spiriva and Advair to [employer]. F/u in 1 mo.  Hernia: will monitor. Refer gen surg if she wants it.  Follow up in 6 mo or prn. The patient voiced understanding and agreement to the plan.  [place], DO 08/24/22 1:48 PM

## 2022-08-24 NOTE — Addendum Note (Signed)
Addended by: Sharon Seller B on: 08/24/2022 02:05 PM   Modules accepted: Orders

## 2022-08-24 NOTE — Patient Instructions (Addendum)
Please stop smoking.   Give [name] 2-3 business days to get the results of your labs back.   Keep the diet clean and stay active.  Please get me a copy of your advanced directive form at your convenience.   The Shingrix vaccine (for shingles) is a 2 shot series spaced 2-6 months apart. It can make people feel low energy, achy and almost like they have the flu for 48 hours after injection. 1/5 people can have nausea and/or vomiting. Please plan accordingly when deciding on when to get this shot. Call our office for a nurse visit appointment to get this. The second shot of the series is less severe regarding the side effects, but it still lasts 48 hours.   Call [facility] [place] at [facility] at [phone] for an appointment.  They are located at [address], [place], [place], [address] (right across the hall from our office).  I recommend getting the flu shot in mid October. This suggestion would change if the CDC comes out with a different recommendation.   Let us know if you need anything.

## 2022-08-27 ENCOUNTER — Other Ambulatory Visit (HOSPITAL_BASED_OUTPATIENT_CLINIC_OR_DEPARTMENT_OTHER): Payer: Self-pay

## 2022-09-03 ENCOUNTER — Other Ambulatory Visit (HOSPITAL_BASED_OUTPATIENT_CLINIC_OR_DEPARTMENT_OTHER): Payer: Self-pay

## 2022-09-07 ENCOUNTER — Ambulatory Visit: Payer: BC Managed Care – PPO | Admitting: Family Medicine

## 2022-09-10 ENCOUNTER — Telehealth: Payer: Self-pay | Admitting: Pulmonary Disease

## 2022-09-10 ENCOUNTER — Telehealth: Payer: Self-pay | Admitting: Family Medicine

## 2022-09-10 NOTE — Telephone Encounter (Signed)
Spoke with patient regarding symptoms.  Patient reports nausea, clamminess and feeling fevered x 1.5 hours.  Denies CP, SOB, or shoulder/jaw/back pain.  Patient states only intake today has been pumpkin bread and coffee, currently on Wegovy injections.   Educated on symptoms of heart attack and importance of emergent treatment.  Patient verbalized understanding. Patient states she lives 1 block from ER and will go if symptoms worsen or if new symptoms develop.   Patient offered appointment, will call back in the AM if needed.

## 2022-09-10 NOTE — Telephone Encounter (Signed)
Patient called to advise that she needs her blood pressure checked. She was at the pharmacy when she began to feel clammy and queasy. Their blood pressure monitor was broken so they told her to go to the ED because her symptoms could be showing signs of a heart attack. Patient went to ED and was told her copay was $500 which she does not have. They suggested that she call us to get her blood pressure checked since we are her PCP. Transferred patient to Clinical Supervisor, Roderic Ovens.

## 2022-09-10 NOTE — Telephone Encounter (Signed)
Error

## 2022-09-11 ENCOUNTER — Other Ambulatory Visit (HOSPITAL_BASED_OUTPATIENT_CLINIC_OR_DEPARTMENT_OTHER): Payer: Self-pay

## 2022-09-24 ENCOUNTER — Other Ambulatory Visit (HOSPITAL_BASED_OUTPATIENT_CLINIC_OR_DEPARTMENT_OTHER): Payer: Self-pay

## 2022-09-24 ENCOUNTER — Other Ambulatory Visit: Payer: Self-pay | Admitting: Family Medicine

## 2022-09-24 ENCOUNTER — Encounter: Payer: Self-pay | Admitting: Family Medicine

## 2022-09-24 MED ORDER — SEMAGLUTIDE-WEIGHT MANAGEMENT 2.4 MG/0.75ML ~~LOC~~ SOAJ
2.4000 mg | SUBCUTANEOUS | 5 refills | Status: DC
  Filled 2022-09-24: qty 3, 28d supply, fill #0
  Filled 2022-10-29: qty 3, 28d supply, fill #1
  Filled 2022-12-09: qty 3, 28d supply, fill #2
  Filled 2022-12-31 – 2023-01-03 (×2): qty 3, 28d supply, fill #3
  Filled 2023-01-20 – 2023-01-28 (×2): qty 3, 28d supply, fill #4

## 2022-09-27 ENCOUNTER — Encounter: Payer: Self-pay | Admitting: Family Medicine

## 2022-09-28 ENCOUNTER — Ambulatory Visit (INDEPENDENT_AMBULATORY_CARE_PROVIDER_SITE_OTHER): Payer: BC Managed Care – PPO | Admitting: Family Medicine

## 2022-09-28 ENCOUNTER — Encounter: Payer: Self-pay | Admitting: Family Medicine

## 2022-09-28 VITALS — BP 120/72 | HR 84 | Temp 97.9°F | Ht 64.0 in | Wt 271.2 lb

## 2022-09-28 DIAGNOSIS — K439 Ventral hernia without obstruction or gangrene: Secondary | ICD-10-CM | POA: Diagnosis not present

## 2022-09-28 DIAGNOSIS — J449 Chronic obstructive pulmonary disease, unspecified: Secondary | ICD-10-CM

## 2022-09-28 NOTE — Patient Instructions (Addendum)
Continue the [facility] for now. If you aren't pleased with the breathing let me know and we can set you up with the lung specialist.  Please stop smoking.   Take 1200 mg of calcium daily and at least 1000 units of vitamin D3 daily.   If you do not hear anything about your referral in the next 1-2 weeks, call our office and ask for an update.  Let us know if you need anything.

## 2022-09-28 NOTE — Progress Notes (Signed)
Chief Complaint  Patient presents with   Follow-up    Subjective: Patient is a 61 y.o. female here for f/u.  Patient following up for COPD.  She was changed from Advair to [facility] history.  She still smokes intermittently but notes her breathing is improved and she is able to move around little bit better.  She has some wheezing and shortness of breath still.  She is compliant with the medication and reports no adverse effects.  She has a history of a bulge in her abdominal region.  She had a hernia repair done with mesh around 3 years ago with Dr. Ninfa Linden at the [facility] surgery team.  She reports there is no improvement in her bulge, proven on ultrasound, after the procedure.  It continues to grow.  It will sometimes hurt when she coughs and sneezes.  No overlying skin changes.  It is right over where she had a lap band done which she feels has failed.  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)   History of blood transfusion 2006   HLD (hyperlipidemia)    Obesity    Sleep apnea    Thyroid disease     Objective: BP 120/72 (BP Location: Right Arm, Patient Position: Sitting, Cuff Size: Normal)   Pulse 84   Temp 97.9 F (36.6 C) (Oral)   Ht 5\' 4"  (1.626 m)   Wt 271 lb 4 oz (123 kg)   SpO2 95%   BMI 46.56 kg/m  General: Awake, appears stated age Heart: RRR Abdomen: Bowel sounds present, soft mass present over the right abdominal region that does feel like it is reproducible Lungs: CTAB, no rales, wheezes or rhonchi. No accessory muscle use Psych: Age appropriate judgment and insight, normal affect and mood  Assessment and Plan: Chronic obstructive pulmonary disease, unspecified COPD type ([place])  Ventral  hernia without obstruction or gangrene - Plan: Ambulatory referral to General Surgery  Chronic, stable.  Continue Breztri twice daily.  Stop smoking. Chronic, unstable.  This has been going on for nearly 3 years.  We will hold off on additional imaging, will send her back to Dr. Ninfa Linden. Follow-up in 6 months for a physical or as needed. The patient voiced understanding and agreement to the plan.  [place], DO 09/28/22  3:44 PM

## 2022-10-05 ENCOUNTER — Other Ambulatory Visit (HOSPITAL_BASED_OUTPATIENT_CLINIC_OR_DEPARTMENT_OTHER): Payer: Self-pay

## 2022-10-29 ENCOUNTER — Encounter: Payer: Self-pay | Admitting: Family Medicine

## 2022-10-29 ENCOUNTER — Other Ambulatory Visit: Payer: Self-pay | Admitting: Family Medicine

## 2022-10-29 ENCOUNTER — Other Ambulatory Visit (HOSPITAL_BASED_OUTPATIENT_CLINIC_OR_DEPARTMENT_OTHER): Payer: Self-pay

## 2022-10-29 DIAGNOSIS — B354 Tinea corporis: Secondary | ICD-10-CM

## 2022-10-29 MED ORDER — KETOCONAZOLE 2 % EX CREA
TOPICAL_CREAM | CUTANEOUS | 0 refills | Status: AC
  Filled 2022-10-29: qty 30, 14d supply, fill #0

## 2022-10-29 MED ORDER — CLOBETASOL PROPIONATE 0.05 % EX SOLN
1.0000 | Freq: Two times a day (BID) | CUTANEOUS | 1 refills | Status: DC | PRN
  Filled 2022-10-29: qty 50, 25d supply, fill #0

## 2022-10-31 ENCOUNTER — Ambulatory Visit (INDEPENDENT_AMBULATORY_CARE_PROVIDER_SITE_OTHER): Payer: BC Managed Care – PPO | Admitting: Family Medicine

## 2022-10-31 ENCOUNTER — Encounter: Payer: Self-pay | Admitting: Family Medicine

## 2022-10-31 ENCOUNTER — Other Ambulatory Visit (HOSPITAL_COMMUNITY)
Admission: RE | Admit: 2022-10-31 | Discharge: 2022-10-31 | Disposition: A | Discharge status: Home / Self Care | Payer: BC Managed Care – PPO | Source: Ambulatory Visit | Attending: Family Medicine | Admitting: Family Medicine

## 2022-10-31 VITALS — BP 132/98 | HR 90 | Ht 64.0 in | Wt 269.0 lb

## 2022-10-31 DIAGNOSIS — Z01419 Encounter for gynecological examination (general) (routine) without abnormal findings: Secondary | ICD-10-CM | POA: Diagnosis not present

## 2022-10-31 DIAGNOSIS — Z1231 Encounter for screening mammogram for malignant neoplasm of breast: Secondary | ICD-10-CM | POA: Diagnosis not present

## 2022-10-31 NOTE — Progress Notes (Signed)
ANNUAL EXAM Patient name: Pamela [name] MRN [id number]  Date of birth: [birth year] Chief Complaint:   Annual Exam  History of Present Illness:   Pamela [name] is a 61 y.o.  G2P1001  female  being seen today for a routine annual exam.  Current complaints: none  No LMP recorded. Patient is postmenopausal.    Last pap 2014. Results were: NILM w/ HRHPV negative. H/O abnormal pap: no Last mammogram: 2022. Results were: normal. Family h/o breast cancer: no Last colonoscopy:      08/24/2022    1:31 PM 06/08/2022    2:56 PM 04/03/2021   10:51 AM 01/03/2021    9:23 AM 06/05/2018    8:25 AM  Depression screen PHQ 2/9  Decreased Interest 0 0 1 0 3  Down, Depressed, Hopeless 0 1 0 0 1  PHQ - 2 Score 0 1 1 0 4  Altered sleeping 0 1   2  Tired, decreased energy 0 3   3  Change in appetite 1 3   1   Feeling bad or failure about yourself  0 0   1  Trouble concentrating 0 0   1  Moving slowly or fidgety/restless 0 0   3  Suicidal thoughts 0 0   0  PHQ-9 Score 1 8   15   Difficult doing work/chores Not difficult at all Not difficult at all   Somewhat difficult         No data to display           Review of Systems:   Pertinent items are noted in HPI Denies any headaches, blurred vision, fatigue, shortness of breath, chest pain, abdominal pain, abnormal vaginal discharge/itching/odor/irritation, problems with periods, bowel movements, urination, or intercourse unless otherwise stated above. Pertinent History Reviewed:  Reviewed past medical,surgical, social and family history.  Reviewed problem list, medications and allergies. Physical Assessment:   Vitals:   10/31/22 0838  BP: (!) 139/100  Pulse: 84  Weight: 269 lb (122 kg)  Height: 5\' 4"  (1.626 m)  Body mass index is 46.17 kg/m.        Physical Examination:   General appearance - well appearing, and in no distress  Mental status - alert, oriented to person, place, and time  Psych:  She has a normal mood and  affect  Skin - warm and dry, normal color, no suspicious lesions noted  Chest - effort normal, all lung fields clear to auscultation bilaterally  Heart - normal rate and regular rhythm  Neck:  midline trachea, no thyromegaly or nodules  Breasts - breasts appear normal, no suspicious masses, no skin or nipple changes or axillary nodes  Abdomen - soft, nontender, nondistended, no masses or organomegaly  Pelvic - VULVA: normal appearing vulva with no masses, tenderness or lesions  VAGINA: normal appearing vagina with atrophic appearance, no lesions  CERVIX: atrophic mucosa on cervix without discharge or lesions, no CMT  Thin prep pap is done with HR HPV cotesting  UTERUS: uterus is felt to be normal size, shape, consistency and nontender   ADNEXA: No adnexal masses or tenderness noted.  Extremities:  No swelling or varicosities noted  Chaperone present for exam  Assessment & Plan:  1. Well woman exam with routine gynecological exam - Cytology - PAP( [place])  2. Encounter for screening mammogram for malignant neoplasm of breast - MM Digital Screening; Future   Labs/procedures today:   Orders Placed This Encounter  Procedures   MM Digital Screening  Meds: No orders of the defined types were placed in this encounter.   Follow-up: No follow-ups on file.  Levie Heritage, DO 10/31/2022 9:04 AM

## 2022-11-02 LAB — CYTOLOGY - PAP
Adequacy: ABSENT
Comment: NEGATIVE
Diagnosis: NEGATIVE
High risk HPV: NEGATIVE

## 2022-11-05 ENCOUNTER — Other Ambulatory Visit (HOSPITAL_BASED_OUTPATIENT_CLINIC_OR_DEPARTMENT_OTHER): Payer: Self-pay

## 2022-11-30 ENCOUNTER — Other Ambulatory Visit: Payer: Self-pay | Admitting: Surgery

## 2022-12-06 ENCOUNTER — Other Ambulatory Visit: Payer: Self-pay | Admitting: Surgery

## 2022-12-06 DIAGNOSIS — K432 Incisional hernia without obstruction or gangrene: Secondary | ICD-10-CM

## 2022-12-10 ENCOUNTER — Other Ambulatory Visit (HOSPITAL_BASED_OUTPATIENT_CLINIC_OR_DEPARTMENT_OTHER): Payer: Self-pay

## 2022-12-11 ENCOUNTER — Encounter: Payer: Self-pay | Admitting: Family Medicine

## 2022-12-11 ENCOUNTER — Other Ambulatory Visit: Payer: Self-pay | Admitting: Family Medicine

## 2022-12-11 ENCOUNTER — Other Ambulatory Visit (HOSPITAL_BASED_OUTPATIENT_CLINIC_OR_DEPARTMENT_OTHER): Payer: Self-pay

## 2022-12-11 DIAGNOSIS — E039 Hypothyroidism, unspecified: Secondary | ICD-10-CM

## 2022-12-11 DIAGNOSIS — E7849 Other hyperlipidemia: Secondary | ICD-10-CM

## 2022-12-11 MED ORDER — ATORVASTATIN CALCIUM 20 MG PO TABS
20.0000 mg | ORAL_TABLET | Freq: Every day | ORAL | 2 refills | Status: DC
  Filled 2022-12-11: qty 90, 90d supply, fill #0
  Filled 2023-04-22: qty 90, 90d supply, fill #1
  Filled 2023-08-11: qty 90, 90d supply, fill #2

## 2022-12-11 MED ORDER — LEVOTHYROXINE SODIUM 150 MCG PO TABS
150.0000 ug | ORAL_TABLET | Freq: Every day | ORAL | 2 refills | Status: DC
  Filled 2022-12-11 (×2): qty 90, 90d supply, fill #0
  Filled 2023-06-10: qty 90, 90d supply, fill #1

## 2022-12-14 ENCOUNTER — Other Ambulatory Visit: Payer: Self-pay | Admitting: Family Medicine

## 2022-12-14 MED ORDER — ALBUTEROL SULFATE HFA 108 (90 BASE) MCG/ACT IN AERS: INHALATION_SPRAY | RESPIRATORY_TRACT | 5 refills | Status: DC

## 2022-12-31 ENCOUNTER — Other Ambulatory Visit (HOSPITAL_BASED_OUTPATIENT_CLINIC_OR_DEPARTMENT_OTHER): Payer: Self-pay

## 2023-01-03 ENCOUNTER — Ambulatory Visit
Admission: RE | Admit: 2023-01-03 | Discharge: 2023-01-03 | Disposition: A | Discharge status: Home / Self Care | Payer: BC Managed Care – PPO | Source: Ambulatory Visit | Attending: Surgery | Admitting: Surgery

## 2023-01-03 DIAGNOSIS — K432 Incisional hernia without obstruction or gangrene: Secondary | ICD-10-CM

## 2023-01-03 MED ORDER — IOPAMIDOL (ISOVUE-300) INJECTION 61%
100.0000 mL | Freq: Once | INTRAVENOUS | Status: AC | PRN
  Administered 2023-01-03: 100 mL via INTRAVENOUS

## 2023-01-08 ENCOUNTER — Other Ambulatory Visit (HOSPITAL_BASED_OUTPATIENT_CLINIC_OR_DEPARTMENT_OTHER): Payer: Self-pay

## 2023-01-21 ENCOUNTER — Other Ambulatory Visit (HOSPITAL_BASED_OUTPATIENT_CLINIC_OR_DEPARTMENT_OTHER): Payer: Self-pay

## 2023-01-31 ENCOUNTER — Encounter: Payer: Self-pay | Admitting: Family Medicine

## 2023-02-04 ENCOUNTER — Other Ambulatory Visit (HOSPITAL_BASED_OUTPATIENT_CLINIC_OR_DEPARTMENT_OTHER): Payer: Self-pay

## 2023-02-04 ENCOUNTER — Ambulatory Visit (INDEPENDENT_AMBULATORY_CARE_PROVIDER_SITE_OTHER): Payer: BC Managed Care – PPO | Admitting: Family Medicine

## 2023-02-04 ENCOUNTER — Encounter: Payer: Self-pay | Admitting: Family Medicine

## 2023-02-04 MED ORDER — SEMAGLUTIDE-WEIGHT MANAGEMENT 2.4 MG/0.75ML ~~LOC~~ SOAJ
2.4000 mg | SUBCUTANEOUS | 2 refills | Status: DC
  Filled 2023-02-04: qty 9, 84d supply, fill #0
  Filled 2023-03-03: qty 3, 28d supply, fill #0

## 2023-02-04 NOTE — Patient Instructions (Signed)
Keep the diet clean and stay active.  Let me know if there are issues.  Let us know if you need anything.

## 2023-02-04 NOTE — Progress Notes (Signed)
Chief Complaint  Patient presents with   need updated weight/height for BMI    Subjective: Patient is a 62 y.o. female here for f/u wt.  Currently on Wegovy 2.4 mg/week. Compliant, no AE's. Diet is healthy. She has lost 15 lbs. Has been swimming for exercise. No CP or new SOB.   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)   History of blood transfusion 2006   HLD (hyperlipidemia)    Obesity    Sleep apnea    Thyroid disease     Objective: BP 120/70 (BP Location: Left Arm, Patient Position: Sitting, Cuff Size: Large)   Pulse 93   Temp 98.3 F (36.8 C) (Oral)   Ht '5\' 4"'$  (1.626 m)   Wt 265 lb (120.2 kg)   SpO2 99%   BMI 45.49 kg/m  General: Awake, appears stated age Heart: RRR Lungs: CTAB, no rales, wheezes or rhonchi. No accessory muscle use Psych: Age appropriate judgment and insight, normal affect and mood  Assessment and Plan: Morbid obesity ([place]) - Plan: Semaglutide-Weight Management 2.4 MG/0.75ML SOAJ  Chronic, improving.  Has lost 15 pounds since starting Wegovy.  Currently taking Wegovy 2.4 mg weekly, will continue this until her insurance stopped covering.  Counseled on diet and exercise. The patient voiced understanding and agreement to the plan.  [place], DO 02/04/23  4:48 PM

## 2023-02-05 ENCOUNTER — Other Ambulatory Visit (HOSPITAL_BASED_OUTPATIENT_CLINIC_OR_DEPARTMENT_OTHER): Payer: Self-pay

## 2023-02-05 ENCOUNTER — Telehealth: Payer: Self-pay

## 2023-02-05 NOTE — Telephone Encounter (Signed)
PA initiated via Covermymeds; KEY: [id number]. Awaiting determination.

## 2023-02-06 ENCOUNTER — Other Ambulatory Visit (HOSPITAL_BASED_OUTPATIENT_CLINIC_OR_DEPARTMENT_OTHER): Payer: Self-pay

## 2023-02-06 ENCOUNTER — Encounter (HOSPITAL_BASED_OUTPATIENT_CLINIC_OR_DEPARTMENT_OTHER): Payer: Self-pay

## 2023-02-06 NOTE — Telephone Encounter (Signed)
PA denied.   Did not lose required 5% body weight to stay on medication.

## 2023-02-06 NOTE — Telephone Encounter (Addendum)
Starting weight was 273lb 03/2021 when [facility] was initially prescribed, most recent weight at visit on 02/04/23 was 265lb. They also look at BMI. Initial was 46.88, most recent was 45.49.  Also, State plan stops GLP-1 coverage for weight loss February 25, 2023.

## 2023-02-06 NOTE — Telephone Encounter (Signed)
Patient informed of denial. She appreciated the effort to try at least.

## 2023-03-03 ENCOUNTER — Ambulatory Visit: Payer: Self-pay | Admitting: Surgery

## 2023-03-04 ENCOUNTER — Other Ambulatory Visit: Payer: Self-pay

## 2023-03-04 ENCOUNTER — Other Ambulatory Visit (HOSPITAL_BASED_OUTPATIENT_CLINIC_OR_DEPARTMENT_OTHER): Payer: Self-pay

## 2023-03-12 NOTE — Patient Instructions (Addendum)
SURGICAL WAITING ROOM VISITATION  Patients having surgery or a procedure may have no more than 2 support people in the waiting area - these visitors may rotate.    Children under the age of 29 must have an adult with them who is not the patient.   If the patient needs to stay at the hospital during part of their recovery, the visitor guidelines for inpatient rooms apply. Pre-op nurse will coordinate an appropriate time for 1 support person to accompany patient in pre-op.  This support person may not rotate.    Please refer to the [facility] website for the visitor guidelines for Inpatients (after your surgery is over and you are in a regular room).       Your procedure is scheduled on: 03-19-23    Report to [facility] Main Entrance    Report to admitting at     0515  AM   Call this number if you have problems the morning of surgery [phone]   Do not eat food :After Midnight.   After Midnight you may have the following liquids until _0430____ AM DAY OF SURGERY   Then nothing by mouth  Water Non-Citrus Juices (without pulp, NO RED-Apple, White grape, White cranberry) Black Coffee (NO MILK/CREAM OR CREAMERS, sugar ok)  Clear Tea (NO MILK/CREAM OR CREAMERS, sugar ok) regular and decaf                             Plain Jell-O (NO RED)                                           Fruit ices (not with fruit pulp, NO RED)                                     Popsicles (NO RED)                                                               Sports drinks like Gatorade (NO RED)                       If you have questions, please contact your surgeon's office.   FOLLOW BOWEL PREP AND ANY ADDITIONAL PRE OP INSTRUCTIONS YOU RECEIVED FROM YOUR SURGEON'S OFFICE!!!     Oral Hygiene is also important to reduce your risk of infection.                                    Remember - BRUSH YOUR TEETH THE MORNING OF SURGERY WITH YOUR REGULAR TOOTHPASTE  DENTURES WILL BE REMOVED PRIOR TO  SURGERY PLEASE DO NOT APPLY "Poly grip" OR ADHESIVES!!!   Do NOT smoke after Midnight   Take these medicines the morning of surgery with A SIP OF WATER: omeprazole if needed, inhalers and bring them with you, levothyroxine, atorvastatin  DO NOT TAKE ANY ORAL DIABETIC MEDICATIONS DAY OF YOUR SURGERY  Stop Semaglutide 7 full days  prior   Bring CPAP mask and tubing day of surgery.                              You may not have any metal on your body including hair pins, jewelry, and body piercing             Do not wear make-up, lotions, powders, perfumes/cologne, or deodorant  Do not wear nail polish including gel and S&S, artificial/acrylic nails, or any other type of covering on natural nails including finger and toenails. If you have artificial nails, gel coating, etc. that needs to be removed by a nail salon please have this removed prior to surgery or surgery may need to be canceled/ delayed if the surgeon/ anesthesia feels like they are unable to be safely monitored.   Do not shave  48 hours prior to surgery.              Do not bring valuables to the hospital. [facility] IS NOT             RESPONSIBLE   FOR VALUABLES.   Contacts, glasses, dentures or bridgework may not be worn into surgery.   Bring small overnight bag day of surgery.   DO NOT BRING YOUR HOME MEDICATIONS TO THE HOSPITAL. PHARMACY WILL DISPENSE MEDICATIONS LISTED ON YOUR MEDICATION LIST TO YOU DURING YOUR ADMISSION IN THE HOSPITAL!    Patients discharged on the day of surgery will not be allowed to drive home.  Someone NEEDS to stay with you for the first 24 hours after anesthesia.                 Please read over the following fact sheets you were given: IF YOU HAVE QUESTIONS ABOUT YOUR PRE-OP INSTRUCTIONS PLEASE CALL [phone]    If you test positive for Covid or have been in contact with anyone that has tested positive in the last 10 days please notify you surgeon.    [facility] - Preparing for  Surgery Before surgery, you can play an important role.  Because skin is not sterile, your skin needs to be as free of germs as possible.  You can reduce the number of germs on your skin by washing with CHG (chlorahexidine gluconate) soap before surgery.  CHG is an antiseptic cleaner which kills germs and bonds with the skin to continue killing germs even after washing. Please DO NOT use if you have an allergy to CHG or antibacterial soaps.  If your skin becomes reddened/irritated stop using the CHG and inform your nurse when you arrive at Short Stay. Do not shave (including legs and underarms) for at least 48 hours prior to the first CHG shower.  You may shave your face/neck. Please follow these instructions carefully:  1.  Shower with CHG Soap the night before surgery and the  morning of Surgery.  2.  If you choose to wash your hair, wash your hair first as usual with your  normal  shampoo.  3.  After you shampoo, rinse your hair and body thoroughly to remove the  shampoo.                           4.  Use CHG as you would any other liquid soap.  You can apply chg directly  to the skin and wash  Gently with a scrungie or clean washcloth.  5.  Apply the CHG Soap to your body ONLY FROM THE NECK DOWN.   Do not use on face/ open                           Wound or open sores. Avoid contact with eyes, ears mouth and genitals (private parts).                       Wash face,  Genitals (private parts) with your normal soap.             6.  Wash thoroughly, paying special attention to the area where your surgery  will be performed.  7.  Thoroughly rinse your body with warm water from the neck down.  8.  DO NOT shower/wash with your normal soap after using and rinsing off  the CHG Soap.                9.  Pat yourself dry with a clean towel.            10.  Wear clean pajamas.            11.  Place clean sheets on your bed the night of your first shower and do not  sleep with pets. Day  of Surgery : Do not apply any lotions/deodorants the morning of surgery.  Please wear clean clothes to the hospital/surgery center.  FAILURE TO FOLLOW THESE INSTRUCTIONS MAY RESULT IN THE CANCELLATION OF YOUR SURGERY PATIENT SIGNATURE_________________________________  NURSE SIGNATURE__________________________________  ________________________________________________________________________

## 2023-03-12 NOTE — Progress Notes (Addendum)
PCP - Arva Chafe , MD LOV 02-04-23 epic Cardiologist - no  PPM/ICD -  Device Orders -  Rep Notified -   Chest x-ray -  EKG -  Stress Test -  ECHO -  Cardiac Cath -   Sleep Study -  CPAP -   Fasting Blood Sugar -  Checks Blood Sugar _____ times a day  Blood Thinner Instructions: Aspirin Instructions:    ERAS Protcol - PRE-SURGERY     COVID vaccine -x2  Activity--SOB with activity not new for her 20 years now Anesthesia review: COPD, OSA, child heart murmur  Patient denies shortness of breath, fever, cough and chest pain at PAT appointment   All instructions explained to the patient, with a verbal understanding of the material. Patient agrees to go over the instructions while at home for a better understanding. Patient also instructed to self quarantine after being tested for COVID-19. The opportunity to ask questions was provided.

## 2023-03-13 ENCOUNTER — Other Ambulatory Visit (HOSPITAL_BASED_OUTPATIENT_CLINIC_OR_DEPARTMENT_OTHER): Payer: Self-pay

## 2023-03-13 ENCOUNTER — Encounter: Payer: Self-pay | Admitting: Family Medicine

## 2023-03-15 ENCOUNTER — Other Ambulatory Visit: Payer: Self-pay

## 2023-03-15 ENCOUNTER — Encounter (HOSPITAL_COMMUNITY)
Admission: RE | Admit: 2023-03-15 | Discharge: 2023-03-15 | Disposition: A | Discharge status: Home / Self Care | Payer: BC Managed Care – PPO | Source: Ambulatory Visit | Attending: Surgery | Admitting: Surgery

## 2023-03-15 ENCOUNTER — Encounter (HOSPITAL_COMMUNITY): Payer: Self-pay

## 2023-03-15 VITALS — BP 136/100 | HR 87 | Temp 98.0°F | Resp 17 | Ht 64.0 in | Wt 261.8 lb

## 2023-03-15 DIAGNOSIS — Z01812 Encounter for preprocedural laboratory examination: Secondary | ICD-10-CM | POA: Insufficient documentation

## 2023-03-15 DIAGNOSIS — R7303 Prediabetes: Secondary | ICD-10-CM | POA: Diagnosis not present

## 2023-03-15 HISTORY — DX: Hypothyroidism, unspecified: E03.9

## 2023-03-15 HISTORY — DX: Pneumonia, unspecified organism: J18.9

## 2023-03-15 LAB — BASIC METABOLIC PANEL
Anion gap: 9 (ref 5–15)
BUN: 10 mg/dL (ref 8–23)
CO2: 25 mmol/L (ref 22–32)
Calcium: 8.7 mg/dL — ABNORMAL LOW (ref 8.9–10.3)
Chloride: 103 mmol/L (ref 98–111)
Creatinine, Ser: 0.69 mg/dL (ref 0.44–1.00)
GFR, Estimated: >60 mL/min (ref >60)
Glucose, Bld: 110 mg/dL — ABNORMAL HIGH (ref 70–99)
Potassium: 3.6 mmol/L (ref 3.5–5.1)
Sodium: 137 mmol/L (ref 135–145)

## 2023-03-15 LAB — CBC
HCT: 46.9 % — ABNORMAL HIGH (ref 36.0–46.0)
Hemoglobin: 15.3 g/dL — ABNORMAL HIGH (ref 12.0–15.0)
MCH: 29.1 pg (ref 26.0–34.0)
MCHC: 32.6 g/dL (ref 30.0–36.0)
MCV: 89.3 fL (ref 80.0–100.0)
Platelets: 378 10*3/uL (ref 150–400)
RBC: 5.25 MIL/uL — ABNORMAL HIGH (ref 3.87–5.11)
RDW: 13.4 % (ref 11.5–15.5)
WBC: 7.6 10*3/uL (ref 4.0–10.5)
nRBC: 0 % (ref 0.0–0.2)

## 2023-03-18 NOTE — Anesthesia Preprocedure Evaluation (Signed)
Anesthesia Evaluation  Patient identified by MRN, date of birth, ID band Patient awake    Reviewed: Allergy & Precautions, H&P , NPO status , Patient's Chart, lab work & pertinent test results  Airway Mallampati: II  TM Distance: >3 FB Neck ROM: Full    Dental no notable dental hx. (+) Partial Upper, Partial Lower,    Pulmonary neg pulmonary ROS, asthma , sleep apnea and Continuous Positive Airway Pressure Ventilation , COPD, Current Smoker and Patient abstained from smoking.   Pulmonary exam normal breath sounds clear to auscultation       Cardiovascular negative cardio ROS Normal cardiovascular exam Rhythm:Regular Rate:Normal     Neuro/Psych negative neurological ROS  negative psych ROS   GI/Hepatic negative GI ROS, Neg liver ROS,GERD  ,,  Endo/Other  negative endocrine ROSHypothyroidism    Renal/GU negative Renal ROSLab Results      Component                Value               Date                      CREATININE               0.69                03/15/2023                BUN                      10                  03/15/2023                NA                       137                 03/15/2023                K                        3.6                 03/15/2023                CL                       103                 03/15/2023                CO2                      25                  03/15/2023             negative genitourinary   Musculoskeletal negative musculoskeletal ROS (+)    Abdominal  (+) + obese (BMI 45)  Peds negative pediatric ROS (+)  Hematology negative hematology ROS (+) Blood dyscrasia, anemia Lab Results      Component                Value  Date                      WBC                      7.6                 03/15/2023                HGB                      15.3 (H)            03/15/2023                HCT                      46.9 (H)            03/15/2023                 MCV                      89.3                03/15/2023                PLT                      378                 03/15/2023              Anesthesia Other Findings   Reproductive/Obstetrics negative OB ROS                             Anesthesia Physical Anesthesia Plan  ASA: 3  Anesthesia Plan: General   Post-op Pain Management: Lidocaine infusion*, Ketamine IV*, Ofirmev IV (intra-op)*, Toradol IV (intra-op)* and Gabapentin PO (pre-op)*   Induction: Intravenous  PONV Risk Score and Plan: 3 and Midazolam, Dexamethasone, Ondansetron and Treatment may vary due to age or medical condition  Airway Management Planned: Oral ETT  Additional Equipment: None  Intra-op Plan:   Post-operative Plan: Extubation in OR  Informed Consent: I have reviewed the patients History and Physical, chart, labs and discussed the procedure including the risks, benefits and alternatives for the proposed anesthesia with the patient or authorized representative who has indicated his/her understanding and acceptance.     Dental advisory given  Plan Discussed with:   Anesthesia Plan Comments:         Anesthesia Quick Evaluation

## 2023-03-19 ENCOUNTER — Ambulatory Visit (HOSPITAL_COMMUNITY): Payer: BC Managed Care – PPO | Admitting: Anesthesiology

## 2023-03-19 ENCOUNTER — Other Ambulatory Visit: Payer: Self-pay

## 2023-03-19 ENCOUNTER — Encounter (HOSPITAL_COMMUNITY)
Admission: RE | Disposition: A | Discharge status: Home / Self Care | Payer: Self-pay | Source: Home / Self Care | Attending: Surgery

## 2023-03-19 ENCOUNTER — Encounter (HOSPITAL_COMMUNITY): Payer: Self-pay | Admitting: Surgery

## 2023-03-19 ENCOUNTER — Ambulatory Visit (HOSPITAL_COMMUNITY)
Admission: RE | Admit: 2023-03-19 | Discharge: 2023-03-19 | Disposition: A | Discharge status: Home / Self Care | Payer: BC Managed Care – PPO | Attending: Surgery | Admitting: Surgery

## 2023-03-19 DIAGNOSIS — G4730 Sleep apnea, unspecified: Secondary | ICD-10-CM | POA: Diagnosis not present

## 2023-03-19 DIAGNOSIS — F17210 Nicotine dependence, cigarettes, uncomplicated: Secondary | ICD-10-CM | POA: Diagnosis not present

## 2023-03-19 DIAGNOSIS — Z9884 Bariatric surgery status: Secondary | ICD-10-CM | POA: Diagnosis not present

## 2023-03-19 DIAGNOSIS — K219 Gastro-esophageal reflux disease without esophagitis: Secondary | ICD-10-CM | POA: Insufficient documentation

## 2023-03-19 DIAGNOSIS — R7303 Prediabetes: Secondary | ICD-10-CM

## 2023-03-19 DIAGNOSIS — E039 Hypothyroidism, unspecified: Secondary | ICD-10-CM | POA: Diagnosis not present

## 2023-03-19 DIAGNOSIS — K430 Incisional hernia with obstruction, without gangrene: Secondary | ICD-10-CM | POA: Insufficient documentation

## 2023-03-19 DIAGNOSIS — J4489 Other specified chronic obstructive pulmonary disease: Secondary | ICD-10-CM | POA: Insufficient documentation

## 2023-03-19 HISTORY — PX: XI ROBOTIC ASSISTED VENTRAL HERNIA: SHX6789

## 2023-03-19 SURGERY — REMOVAL, GASTRIC BAND, LAPAROSCOPIC
Anesthesia: General

## 2023-03-19 MED ORDER — DEXAMETHASONE SODIUM PHOSPHATE 10 MG/ML IJ SOLN
INTRAMUSCULAR | Status: AC
  Filled 2023-03-19: qty 1

## 2023-03-19 MED ORDER — ONDANSETRON HCL 4 MG/2ML IJ SOLN
INTRAMUSCULAR | Status: AC
  Filled 2023-03-19: qty 2

## 2023-03-19 MED ORDER — KETAMINE HCL 10 MG/ML IJ SOLN
INTRAMUSCULAR | Status: DC | PRN
  Administered 2023-03-19: 30 mg via INTRAVENOUS
  Administered 2023-03-19 (×2): 10 mg via INTRAVENOUS

## 2023-03-19 MED ORDER — BUPIVACAINE-EPINEPHRINE (PF) 0.5% -1:200000 IJ SOLN
INTRAMUSCULAR | Status: AC
  Filled 2023-03-19: qty 30

## 2023-03-19 MED ORDER — SUGAMMADEX SODIUM 200 MG/2ML IV SOLN
INTRAVENOUS | Status: DC | PRN
  Administered 2023-03-19: 250 mg via INTRAVENOUS

## 2023-03-19 MED ORDER — HYDRALAZINE HCL 20 MG/ML IJ SOLN
INTRAMUSCULAR | Status: AC
  Filled 2023-03-19: qty 1

## 2023-03-19 MED ORDER — MIDAZOLAM HCL 2 MG/2ML IJ SOLN
INTRAMUSCULAR | Status: AC
  Filled 2023-03-19: qty 2

## 2023-03-19 MED ORDER — FENTANYL CITRATE (PF) 250 MCG/5ML IJ SOLN
INTRAMUSCULAR | Status: DC | PRN
  Administered 2023-03-19 (×5): 50 ug via INTRAVENOUS

## 2023-03-19 MED ORDER — CHLORHEXIDINE GLUCONATE CLOTH 2 % EX PADS: 6.0000 | MEDICATED_PAD | Freq: Once | CUTANEOUS | Status: DC

## 2023-03-19 MED ORDER — ROCURONIUM BROMIDE 10 MG/ML (PF) SYRINGE
PREFILLED_SYRINGE | INTRAVENOUS | Status: DC | PRN
  Administered 2023-03-19: 50 mg via INTRAVENOUS
  Administered 2023-03-19: 30 mg via INTRAVENOUS
  Administered 2023-03-19: 10 mg via INTRAVENOUS
  Administered 2023-03-19 (×2): 5 mg via INTRAVENOUS

## 2023-03-19 MED ORDER — CHLORHEXIDINE GLUCONATE 0.12 % MT SOLN
15.0000 mL | Freq: Once | OROMUCOSAL | Status: AC
  Administered 2023-03-19: 15 mL via OROMUCOSAL

## 2023-03-19 MED ORDER — KETAMINE HCL 50 MG/5ML IJ SOSY
PREFILLED_SYRINGE | INTRAMUSCULAR | Status: AC
  Filled 2023-03-19: qty 5

## 2023-03-19 MED ORDER — ACETAMINOPHEN 10 MG/ML IV SOLN: 1000.0000 mg | Freq: Once | INTRAVENOUS | Status: DC | PRN

## 2023-03-19 MED ORDER — 0.9 % SODIUM CHLORIDE (POUR BTL) OPTIME
TOPICAL | Status: DC | PRN
  Administered 2023-03-19: 1000 mL

## 2023-03-19 MED ORDER — ROCURONIUM BROMIDE 10 MG/ML (PF) SYRINGE
PREFILLED_SYRINGE | INTRAVENOUS | Status: AC
  Filled 2023-03-19: qty 10

## 2023-03-19 MED ORDER — BUPIVACAINE LIPOSOME 1.3 % IJ SUSP: 20.0000 mL | Freq: Once | INTRAMUSCULAR | Status: DC

## 2023-03-19 MED ORDER — ORAL CARE MOUTH RINSE: 15.0000 mL | Freq: Once | OROMUCOSAL | Status: AC

## 2023-03-19 MED ORDER — ONDANSETRON HCL 4 MG/2ML IJ SOLN: 4.0000 mg | Freq: Once | INTRAMUSCULAR | Status: DC | PRN

## 2023-03-19 MED ORDER — LIDOCAINE HCL 2 % IJ SOLN
INTRAMUSCULAR | Status: AC
  Filled 2023-03-19: qty 20

## 2023-03-19 MED ORDER — BUPIVACAINE-EPINEPHRINE (PF) 0.5% -1:200000 IJ SOLN
INTRAMUSCULAR | Status: DC | PRN
  Administered 2023-03-19: 50 mL

## 2023-03-19 MED ORDER — OXYCODONE HCL 5 MG PO TABS
5.0000 mg | ORAL_TABLET | Freq: Once | ORAL | Status: AC | PRN
  Administered 2023-03-19: 5 mg via ORAL

## 2023-03-19 MED ORDER — DEXAMETHASONE SODIUM PHOSPHATE 10 MG/ML IJ SOLN
INTRAMUSCULAR | Status: DC | PRN
  Administered 2023-03-19: 10 mg via INTRAVENOUS

## 2023-03-19 MED ORDER — DEXMEDETOMIDINE HCL IN NACL 80 MCG/20ML IV SOLN
INTRAVENOUS | Status: DC | PRN
  Administered 2023-03-19 (×2): 8 ug via BUCCAL
  Administered 2023-03-19: 4 ug via BUCCAL

## 2023-03-19 MED ORDER — AMISULPRIDE (ANTIEMETIC) 5 MG/2ML IV SOLN: 10.0000 mg | Freq: Once | INTRAVENOUS | Status: AC | PRN

## 2023-03-19 MED ORDER — LACTATED RINGERS IV SOLN: INTRAVENOUS | Status: DC

## 2023-03-19 MED ORDER — ONDANSETRON HCL 4 MG/2ML IJ SOLN
INTRAMUSCULAR | Status: DC | PRN
  Administered 2023-03-19: 4 mg via INTRAVENOUS

## 2023-03-19 MED ORDER — PHENYLEPHRINE HCL-NACL 20-0.9 MG/250ML-% IV SOLN
INTRAVENOUS | Status: DC | PRN
  Administered 2023-03-19: 50 ug/min via INTRAVENOUS

## 2023-03-19 MED ORDER — CEFAZOLIN SODIUM-DEXTROSE 2-4 GM/100ML-% IV SOLN
2.0000 g | INTRAVENOUS | Status: AC
  Administered 2023-03-19: 2 g via INTRAVENOUS
  Filled 2023-03-19: qty 100

## 2023-03-19 MED ORDER — OXYCODONE HCL 5 MG/5ML PO SOLN: 5.0000 mg | Freq: Once | ORAL | Status: AC | PRN

## 2023-03-19 MED ORDER — ACETAMINOPHEN 500 MG PO TABS: 500.0000 mg | ORAL_TABLET | Freq: Four times a day (QID) | ORAL | 0 refills | Status: DC | PRN

## 2023-03-19 MED ORDER — LIDOCAINE 2% (20 MG/ML) 5 ML SYRINGE
INTRAMUSCULAR | Status: DC | PRN
  Administered 2023-03-19: 100 mg via INTRAVENOUS
  Administered 2023-03-19: 1.5 mg/kg/h via INTRAVENOUS

## 2023-03-19 MED ORDER — HYDROMORPHONE HCL 1 MG/ML IJ SOLN
INTRAMUSCULAR | Status: AC
  Filled 2023-03-19: qty 1

## 2023-03-19 MED ORDER — ACETAMINOPHEN 500 MG PO TABS
1000.0000 mg | ORAL_TABLET | ORAL | Status: AC
  Administered 2023-03-19: 1000 mg via ORAL
  Filled 2023-03-19: qty 2

## 2023-03-19 MED ORDER — FENTANYL CITRATE (PF) 250 MCG/5ML IJ SOLN
INTRAMUSCULAR | Status: AC
  Filled 2023-03-19: qty 5

## 2023-03-19 MED ORDER — OXYCODONE HCL 5 MG PO TABS: 5.0000 mg | ORAL_TABLET | ORAL | 0 refills | Status: DC | PRN

## 2023-03-19 MED ORDER — PROPOFOL 10 MG/ML IV BOLUS
INTRAVENOUS | Status: DC | PRN
  Administered 2023-03-19: 140 mg via INTRAVENOUS

## 2023-03-19 MED ORDER — HYDRALAZINE HCL 20 MG/ML IJ SOLN
INTRAMUSCULAR | Status: DC | PRN
  Administered 2023-03-19 (×3): 5 mg via INTRAVENOUS

## 2023-03-19 MED ORDER — DEXMEDETOMIDINE HCL IN NACL 80 MCG/20ML IV SOLN
INTRAVENOUS | Status: AC
  Filled 2023-03-19: qty 20

## 2023-03-19 MED ORDER — IPRATROPIUM-ALBUTEROL 0.5-2.5 (3) MG/3ML IN SOLN
RESPIRATORY_TRACT | Status: AC
  Filled 2023-03-19: qty 3

## 2023-03-19 MED ORDER — OXYCODONE HCL 5 MG PO TABS
ORAL_TABLET | ORAL | Status: AC
  Filled 2023-03-19: qty 1

## 2023-03-19 MED ORDER — LACTATED RINGERS IR SOLN
Status: DC | PRN
  Administered 2023-03-19: 1000 mL

## 2023-03-19 MED ORDER — METHOCARBAMOL 750 MG PO TABS: 750.0000 mg | ORAL_TABLET | Freq: Four times a day (QID) | ORAL | 0 refills | Status: DC | PRN

## 2023-03-19 MED ORDER — LIDOCAINE HCL (PF) 2 % IJ SOLN
INTRAMUSCULAR | Status: AC
  Filled 2023-03-19: qty 5

## 2023-03-19 MED ORDER — PROPOFOL 10 MG/ML IV BOLUS
INTRAVENOUS | Status: AC
  Filled 2023-03-19: qty 20

## 2023-03-19 MED ORDER — AMISULPRIDE (ANTIEMETIC) 5 MG/2ML IV SOLN
INTRAVENOUS | Status: AC
  Administered 2023-03-19: 10 mg via INTRAVENOUS
  Filled 2023-03-19: qty 4

## 2023-03-19 MED ORDER — MIDAZOLAM HCL 2 MG/2ML IJ SOLN
INTRAMUSCULAR | Status: DC | PRN
  Administered 2023-03-19: 2 mg via INTRAVENOUS

## 2023-03-19 MED ORDER — ACETAMINOPHEN 10 MG/ML IV SOLN
INTRAVENOUS | Status: AC
  Filled 2023-03-19: qty 100

## 2023-03-19 MED ORDER — IPRATROPIUM-ALBUTEROL 0.5-2.5 (3) MG/3ML IN SOLN
3.0000 mL | Freq: Once | RESPIRATORY_TRACT | Status: AC
  Administered 2023-03-19: 3 mL via RESPIRATORY_TRACT

## 2023-03-19 MED ORDER — HYDROMORPHONE HCL 1 MG/ML IJ SOLN
0.2500 mg | INTRAMUSCULAR | Status: DC | PRN
  Administered 2023-03-19 (×4): 0.5 mg via INTRAVENOUS

## 2023-03-19 MED ORDER — BUPIVACAINE LIPOSOME 1.3 % IJ SUSP
INTRAMUSCULAR | Status: AC
  Filled 2023-03-19: qty 20

## 2023-03-19 SURGICAL SUPPLY — 70 items
ADH SKN CLS APL DERMABOND .7 (GAUZE/BANDAGES/DRESSINGS) ×1
ANTIFOG SOL W/FOAM PAD STRL (MISCELLANEOUS) ×1
APL PRP STRL LF DISP 70% ISPRP (MISCELLANEOUS) ×1
BAG COUNTER SPONGE SURGICOUNT (BAG) ×1 IMPLANT
BAG SPNG CNTER NS LX DISP (BAG) ×1
BINDER ABDOMINAL 12 ML 46-62 (SOFTGOODS) IMPLANT
BLADE SURG SZ11 CARB STEEL (BLADE) ×1 IMPLANT
CHLORAPREP W/TINT 26 (MISCELLANEOUS) ×1 IMPLANT
COVER MAYO STAND STRL (DRAPES) ×1 IMPLANT
COVER TIP SHEARS 8 DVNC (MISCELLANEOUS) ×1 IMPLANT
DERMABOND ADVANCED .7 DNX12 (GAUZE/BANDAGES/DRESSINGS) IMPLANT
DEVICE TROCAR PUNCTURE CLOSURE (ENDOMECHANICALS) IMPLANT
DRAPE ARM DVNC X/XI (DISPOSABLE) ×3 IMPLANT
DRAPE COLUMN DVNC XI (DISPOSABLE) ×1 IMPLANT
DRIVER NDL LRG 8 DVNC XI (INSTRUMENTS) ×2 IMPLANT
DRIVER NDL MEGA 8 DVNC XI (INSTRUMENTS) ×2 IMPLANT
DRIVER NDLE LRG 8 DVNC XI (INSTRUMENTS) ×2 IMPLANT
DRIVER NDLE MEGA DVNC XI (INSTRUMENTS) ×2 IMPLANT
ELECT L-HOOK LAP 45CM DISP (ELECTROSURGICAL) ×1
ELECT PENCIL ROCKER SW 15FT (MISCELLANEOUS) ×1 IMPLANT
ELECT REM PT RETURN 15FT ADLT (MISCELLANEOUS) ×1 IMPLANT
ELECTRODE L-HOOK LAP 45CM DISP (ELECTROSURGICAL) ×1 IMPLANT
FORCEPS PROGRASP DVNC XI (FORCEP) ×1 IMPLANT
GLOVE BIO SURGEON STRL SZ7.5 (GLOVE) ×2 IMPLANT
GLOVE BIOGEL PI IND STRL 8 (GLOVE) ×2 IMPLANT
GOWN STRL REUS W/ TWL XL LVL3 (GOWN DISPOSABLE) ×3 IMPLANT
GOWN STRL REUS W/TWL XL LVL3 (GOWN DISPOSABLE) ×3
GRASPER SUT TROCAR 14GX15 (MISCELLANEOUS) IMPLANT
GRASPER TIP-UP FEN DVNC XI (INSTRUMENTS) ×1 IMPLANT
IRRIG SUCT STRYKERFLOW 2 WTIP (MISCELLANEOUS) ×1
IRRIGATION SUCT STRKRFLW 2 WTP (MISCELLANEOUS) IMPLANT
KIT BASIN OR (CUSTOM PROCEDURE TRAY) ×1 IMPLANT
KIT TURNOVER KIT A (KITS) IMPLANT
MANIFOLD NEPTUNE II (INSTRUMENTS) ×1 IMPLANT
NDL SPNL 18GX3.5 QUINCKE PK (NEEDLE) ×1 IMPLANT
NEEDLE SPNL 18GX3.5 QUINCKE PK (NEEDLE) ×1 IMPLANT
PACK CARDIOVASCULAR III (CUSTOM PROCEDURE TRAY) ×1 IMPLANT
SCISSORS LAP 5X45 EPIX DISP (ENDOMECHANICALS) IMPLANT
SCISSORS MNPLR CVD DVNC XI (INSTRUMENTS) ×1 IMPLANT
SEAL UNIV 5-12 XI (MISCELLANEOUS) ×3 IMPLANT
SET TUBE SMOKE EVAC HIGH FLOW (TUBING) ×1 IMPLANT
SHEARS HARMONIC ACE PLUS 45CM (MISCELLANEOUS) IMPLANT
SLEEVE Z-THREAD 5X100MM (TROCAR) IMPLANT
SOL ELECTROSURG ANTI STICK (MISCELLANEOUS) ×1
SOLUTION ANTFG W/FOAM PAD STRL (MISCELLANEOUS) ×1 IMPLANT
SOLUTION ELECTROSURG ANTI STCK (MISCELLANEOUS) ×1 IMPLANT
SPIKE FLUID TRANSFER (MISCELLANEOUS) ×1 IMPLANT
SUT MNCRL AB 4-0 PS2 18 (SUTURE) ×1 IMPLANT
SUT NOVA NAB GS-21 0 18 T12 DT (SUTURE) IMPLANT
SUT STRAFIX PDS 18 CTX (SUTURE) IMPLANT
SUT STRAFIX SYMMETRIC 0-0 24 (SUTURE)
SUT STRAFIX SYMMETRIC 1-0 12 (SUTURE)
SUT STRAFIX SYMMETRIC 1-0 24 (SUTURE)
SUT STRTFX SPIRAL PDS+ 2-0 23 (SUTURE)
SUT VIC AB 2-0 SH 18 (SUTURE) IMPLANT
SUT VLOC 180 0 6IN GS21 (SUTURE) IMPLANT
SUT VLOC 180 0 9IN  GS21 (SUTURE)
SUT VLOC 180 0 9IN GS21 (SUTURE) IMPLANT
SUTURE STRAFIX SYMMETRC 0-0 24 (SUTURE) IMPLANT
SUTURE STRAFIX SYMMETRC 1-0 12 (SUTURE) IMPLANT
SUTURE STRAFIX SYMMETRC 1-0 24 (SUTURE) IMPLANT
SUTURE STRTFX SPRL PDS+ 2-0 23 (SUTURE) IMPLANT
SYR 20ML LL LF (SYRINGE) ×1 IMPLANT
SYS RETRIEVAL 5MM INZII UNIV (BASKET) ×1
SYSTEM RETRIEVL 5MM INZII UNIV (BASKET) IMPLANT
TAPE STRIPS DRAPE STRL (GAUZE/BANDAGES/DRESSINGS) ×1 IMPLANT
TOWEL OR 17X26 10 PK STRL BLUE (TOWEL DISPOSABLE) ×1 IMPLANT
TOWEL OR NON WOVEN STRL DISP B (DISPOSABLE) IMPLANT
TROCAR ADV FIXATION 12X100MM (TROCAR) ×1 IMPLANT
TROCAR Z-THREAD FIOS 5X100MM (TROCAR) ×1 IMPLANT

## 2023-03-19 NOTE — Anesthesia Procedure Notes (Signed)
Procedure Name: Intubation Date/Time: 03/19/2023 7:44 AM  Performed by: Florene Route, CRNAPre-anesthesia Checklist: Patient identified, Emergency Drugs available, Suction available and Patient being monitored Patient Re-evaluated:Patient Re-evaluated prior to induction Oxygen Delivery Method: Circle system utilized Preoxygenation: Pre-oxygenation with 100% oxygen Induction Type: IV induction Ventilation: Mask ventilation without difficulty and Oral airway inserted - appropriate to patient size Laryngoscope Size: Hyacinth Meeker and 2 Grade View: Grade I Tube type: Oral Tube size: 7.5 mm Number of attempts: 1 Airway Equipment and Method: Stylet Placement Confirmation: ETT inserted through vocal cords under direct vision, positive ETCO2 and breath sounds checked- equal and bilateral Secured at: 20 cm Tube secured with: Tape Dental Injury: Teeth and Oropharynx as per pre-operative assessment

## 2023-03-19 NOTE — Discharge Instructions (Signed)
 VENTRAL HERNIA REPAIR POST OPERATIVE INSTRUCTIONS  Thinking Clearly  The anesthesia may cause you to feel different for 1 or 2 days. Do not drive, drink alcohol, or make any big decisions for at least 2 days.  Nutrition When you wake up, you will be able to drink small amounts of liquid. If you do not feel sick, you can slowly advance your diet to regular foods. Continue to drink lots of fluids, usually about 8 to 10 glasses per day. Eat a high-fiber diet so you don't strain during bowel movements. High-Fiber Foods Foods high in fiber include beans, bran cereals and whole-grain breads, peas, dried fruit (figs, apricots, and dates), raspberries, blackberries, strawberries, sweet corn, broccoli, baked potatoes with skin, plums, pears, apples, greens, and nuts. Activity Slowly increase your activity. Be sure to get up and walk every hour or so to prevent blood clots. No heavy lifting or strenuous activity for 4 weeks following surgery to prevent hernias at your incision sites or recurrence of your hernia. It is normal to feel tired. You may need more sleep than usual.  Get your rest but make sure to get up and move around frequently to prevent blood clots and pneumonia.  Work and Return to School You can go back to work when you feel well enough. Discuss the timing with your surgeon. You can usually go back to school or work 1 week or less after an laparoscopic or an open repair. If your work requires heavy lifting or strenuous activity you need to be placed on light duty for 4 weeks following surgery. You can return to gym class, sports or other physical activities 4 weeks after surgery.  Wound Care You may experience significant bruising throughout the abdominal wall that may track down into the groin including into the scrotum in males.  Rest, elevating the groin and scrotum above the level of the heart, ice and compression with tight fitting underwear or an abdominal binder can help.   Always wash your hands before and after touching near your incision site. Do not soak in a bathtub until cleared at your follow up appointment. You may take a shower 24 hours after surgery. A small amount of drainage from the incision is normal. If the drainage is thick and yellow or the site is red, you may have an infection, so call your surgeon. If you have a drain in one of your incisions, it will be taken out in office when the drainage stops. Steri-Strips will fall off in 7 to 10 days or they will be removed during your first office visit. If you have dermabond glue covering over the incision, allow the glue to flake off on its own. Protect the new skin, especially from the sun. The sun can burn and cause darker scarring. Your scar will heal in about 4 to 6 weeks and will become softer and continue to fade over the next year.  The cosmetic appearance of the incisions will improve over the course of the first year after surgery. Sensation around your incision will return in a few weeks or months.  Bowel Movements After intestinal surgery, you may have loose watery stools for several days. If watery diarrhea lasts longer than 3 days, contact your surgeon. Pain medication (narcotics) can cause constipation. Increase the fiber in your diet with high-fiber foods if you are constipated. You can take an over the counter stool softener like Colace to avoid constipation.  Additional over the counter medications can also be used   if Colace isn't sufficient (for example, Milk of Magnesia or Miralax).  Pain The amount of pain is different for each person. Some people need only 1 to 3 doses of pain control medication, while others need more. Take alternating doses of tylenol and ibuprofen around the clock for the first five days following surgery.  This will provide a baseline of pain control and help with inflammation.  Take the narcotic pain medication in addition if needed for severe pain.  Contact  Your Surgeon at [phone], if you have: Pain that will not go away Pain that gets worse A fever of more than 101F (38.3C) Repeated vomiting Swelling, redness, bleeding, or bad-smelling drainage from your wound site Strong abdominal pain No bowel movement or unable to pass gas for 3 days Watery diarrhea lasting longer than 3 days  Pain Control The goal of pain control is to minimize pain, keep you moving and help you heal. Your surgical team will work with you on your pain plan. Most often a combination of therapies and medications are used to control your pain. You may also be given medication (local anesthetic) at the surgical site. This may help control your pain for several days. Extreme pain puts extra stress on your body at a time when your body needs to focus on healing. Do not wait until your pain has reached a level "10" or is unbearable before telling your doctor or nurse. It is much easier to control pain before it becomes severe. Following a laparoscopic procedure, pain is sometimes felt in the shoulder. This is due to the gas inserted into your abdomen during the procedure. Moving and walking helps to decrease the gas and the right shoulder pain.  Use the guide below for ways to manage your post-operative pain. Learn more by going to facs.org/safepaincontrol.  How Intense Is My Pain Common Therapies to Feel Better       I hardly notice my pain, and it does not interfere with my activities.  I notice my pain and it distracts me, but I can still do activities (sitting up, walking, standing).  Non-Medication Therapies  Ice (in a bag, applied over clothing at the surgical site), elevation, rest, meditation, massage, distraction (music, TV, play) walking and mild exercise Splinting the abdomen with pillows +  Non-Opioid Medications Acetaminophen (Tylenol) Non-steroidal anti-inflammatory drugs (NSAIDS) Aspirin, Ibuprofen (Motrin, Advil) Naproxen (Aleve) Take these as  needed, when you feel pain. Both acetaminophen and NSAIDs help to decrease pain and swelling (inflammation).      My pain is hard to ignore and is more noticeable even when I rest.  My pain interferes with my usual activities.  Non-Medication Therapies  +  Non-Opioid medications  Take on a regular schedule (around-the-clock) instead of as needed. (For example, Tylenol every 6 hours at 9:00 am, 3:00 pm, 9:00 pm, 3:00 am and Motrin every 6 hours at 12:00 am, 6:00 am, 12:00 pm, 6:00 pm)         I am focused on my pain, and I am not doing my daily activities.  I am groaning in pain, and I cannot sleep. I am unable to do anything.  My pain is as bad as it could be, and nothing else matters.  Non-Medication Therapies  +  Around-the-Clock Non-Opioid Medications  +  Short-acting opioids  Opioids should be used with other medications to manage severe pain. Opioids block pain and give a feeling of euphoria (feel high). Addiction, a serious side effect of opioids, is   rare with short-term (a few days) use.  Examples of short-acting opioids include: Tramadol (Ultram), Hydrocodone (Norco, Vicodin), Hydromorphone (Dilaudid), Oxycodone (Oxycontin)     The above directions have been adapted from the American College [employer] Surgical Patient Education Program.  Please refer to the ACS website if needed: https://www.facs.org/-/media/files/education/patient-ed/[email or web address]_[email or web address].ashx   Asuncion Shibata, MD [facility] [address], [address], [place], [place]  [address] ?  [address] [address], [place], [place]   [address] [phone] ? [phone] ? FAX [phone] Web site: [email or web address]  

## 2023-03-19 NOTE — Op Note (Addendum)
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 cm wide by 3.8 cm tall involving lap band components) History of Laparoscopic Adjustable Gastric Banding  Surgical Procedure:  Laparoscopic removal of lap band Open primary repair of recurrent incisional hernia (3.8 cm x 3.8 cm) Removal of intraperitoneal mesh (3cm x 3cm)  Operative Team Members:  Surgeon(s) and Role:    * Armeda Plumb, Hyman Hopes, MD - Primary   Anesthesiologist: Trevor Iha, MD CRNA: Briant Sites, CRNA; Florene Route, CRNA   Anesthesia: General   Fluids:  Total I/O In: 1000 [I.V.:1000] Out: 175 [Urine:100; Blood:75]  Complications: None  Drains:  none   Specimen: None  Disposition:  PACU - hemodynamically stable.  Plan of Care: Discharge to home after PACU    Indications for Procedure: Donatella Walski is a 62 y.o. female with a history of lap band placement who presented with a recurrent incarcerated incisional hernia.  Ms. Rohde has a 3.8 cm wide by 3.8 cm tall recurrent incarcerated incisional hernia at her previous Lap-Band port insertion site. This hernia is complicated by this adjustable gastric band. We discussed the pathophysiology of hernias. She has not used the Lap-Band in a long time and would like it removed. I feel removing the Lap-Band will allow me to provide better overlap of the hernia defect and a more lasting repair. I recommended laparoscopic Lap-Band removal followed by robotic, possibly open ventral hernia repair with mesh. We discussed the procedure itself as well as its risk, benefits, and alternatives. I explained the risk of possibly needing to stage the procedure with Lap-Band removal  first and ventral hernia repair later if it is difficult to remove the Lap-Band. I am concerned about this hernia as it has a relatively narrow neck and a large amount of the transverse colon and she has obstructive symptoms related to it. After full discussion of all the procedure, its risk, benefits, and alternatives, the patient granted consent to proceed. We will proceed as scheduled.   Findings: Incarcerated colon unable to be reduced laparoscopically.  3cm x 3 cm intraperitoneal mesh incorporated in peritoneum but not fascia with hernia recurrence at superior aspect of mesh.  Lap band with significant scar tissue.   Description of Procedure:   On the date stated above the patient stated operating room suite and placed in supine position.  General endotracheal anesthesia was induced.  Timeout was completed verifying the correct patient, procedure, position, and equipment needed for the case.  The patient's abdomen was prepped and draped in usual sterile fashion.  A Foley catheter was placed.  The case started.  SCDs were placed and antibiotics were given.  I began by making a 5 mm incision in the right upper quadrant.  I inserted the 5 mm optical trocar into the abdomen insufflated the abdomen to 15 mmHg.  A small nick in the liver capsule was created with initial trocar placement.  Three additional trocars were placed across the mid abdomen.  A suction irrigator was used to examine the liver and cautery was used to cauterize the injury.  At this point is hemostatic without any leakage of bile so we proceeded with the case.  Attempts were made to reduce the incarcerated midline hernia.  It contained omentum and a loop  of transverse colon.  I worked for some time laparoscopically and attempt to reduce this but was unable to.  A small intraperitoneal mesh was noted just inferior to the hernia defect.  It appeared well incorporated into the peritoneum but not incorporated into the fascia of the abdominal  wall.  The hernia defect abutted the superior aspect of this mesh.  I directed my attention to Lap-Band removal.  The liver was elevated and the Lap-Band was dissected away from its attachments to the liver and the left upper quadrant structures.  The buckle of the band was identified and divided using the scissors.  The fluid in the band was evacuated by cutting the balloon.  The Lap-Band was then pulled out of its position around the stomach into the abdomen.  It was placed in a 5 mm bag to be later removed through the hernia fascial defect.  The tubing was divided being careful to keep track of the connector piece.  The connector piece was included with the band in the bag so we did not lose any tubing pulling the port.  A midline laparotomy incision was then made to access the subcutaneous port and the hernia defect.  The hernia defect was dissected.  The sac was defined down to the fascia and then it was opened.  The omentum that was incarcerated in the hernia was divided using the harmonic and passed off the field.  The colon was examined and then returned into the abdomen.  The port was removed from the subcutaneous space and passed off the field.  A picture was taken with the port and all of its components.  The components were examined on the back table acted.  We had the entire lap band system removed.  The intraperitoneal mesh was removed through the fascial defect.  I use electrocautery to dissect the mesh off of the underside of the abdominal wall.  This was passed off the field.  It was included in the picture below.  It measured 3 cm x 3 cm.  The fascial defect was then closed using interrupted figure-of-eight 0 Novafil suture.  The wound was irrigated.  The skin was closed with a subcuticular level using Vicryl and then Monocryl to close the skin.  Dermabond was applied.  All sponge needle counts were correct at the end of this case.  At the end of the case we reviewed the infection status of the  case. Patient: Private Patient Elective Case Case: Elective Infection Present At Time Of Surgery (PATOS): None  Ivar Drape, MD General, Bariatric, & Minimally Invasive Surgery [facility], [place]

## 2023-03-19 NOTE — Anesthesia Postprocedure Evaluation (Signed)
Anesthesia Post Note  Patient: Pamela [name]  Procedure(s) Performed: LAPAROSCOPIC REMOVAL OF LAP BAND OPEN PRIMARY CLOSURE OF RECURRENT INCISIONAL HERNIA AND REMOVAL OF INTRAPERITONEAL MESH     Patient location during evaluation: PACU Anesthesia Type: General Level of consciousness: awake and alert Pain management: pain level controlled Vital Signs Assessment: post-procedure vital signs reviewed and stable Respiratory status: spontaneous breathing, nonlabored ventilation, respiratory function stable and patient connected to nasal cannula oxygen Cardiovascular status: blood pressure returned to baseline and stable Postop Assessment: no apparent nausea or vomiting Anesthetic complications: no  No notable events documented.  Last Vitals:  Vitals:   03/19/23 1327 03/19/23 1332  BP: 96/74 109/83  Pulse: 74 79  Resp: 12 18  Temp:  (!) 36.4 C  SpO2: 91% 90%    Last Pain:  Vitals:   03/19/23 1332  TempSrc: Oral  PainSc: 3                  Trevor Iha

## 2023-03-19 NOTE — Transfer of Care (Signed)
Immediate Anesthesia Transfer of Care Note  Patient: Pamela [name]  Procedure(s) Performed: LAPAROSCOPIC REMOVAL OF LAP BAND OPEN PRIMARY CLOSURE OF RECURRENT INCISIONAL HERNIA AND REMOVAL OF INTRAPERITONEAL MESH  Patient Location: PACU  Anesthesia Type:General  Level of Consciousness: drowsy  Airway & Oxygen Therapy: Patient Spontanous Breathing and Patient connected to face mask oxygen  Post-op Assessment: Report given to RN and Post -op Vital signs reviewed and stable  Post vital signs: Reviewed and stable  Last Vitals:  Vitals Value Taken Time  BP 141/92 03/19/23 1036  Temp    Pulse 84 03/19/23 1037  Resp 27 03/19/23 1037  SpO2 99 % 03/19/23 1037  Vitals shown include unvalidated device data.  Last Pain:  Vitals:   03/19/23 0641  TempSrc:   PainSc: 0-No pain      Patients Stated Pain Goal: 3 (03/19/23 0641)  Complications: No notable events documented.

## 2023-03-19 NOTE — H&P (Signed)
Admitting Physician: Pamela [name]  Service: General Surgery  CC: Hernia, lap band  Subjective   HPI: Pamela [name] is an 62 y.o. 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)   History of blood transfusion 2006   HLD (hyperlipidemia)    Hypothyroidism    Obesity    Pneumonia    Sleep apnea    cpap   Thyroid disease     Past Surgical History:  Procedure Laterality Date   AUGMENTATION MAMMAPLASTY Bilateral 1987   [place]   BREAST BIOPSY     INSERTION OF MESH N/A 07/16/2019   Procedure: Insertion Of Mesh;  Surgeon: Pamela Miyamoto, MD;  Location: [facility] OR;  Service: General;  Laterality: N/A;   LAPAROSCOPIC GASTRIC BANDING  2010   VENTRAL HERNIA REPAIR N/A 07/16/2019   Procedure: VENTRAL INCISIONAL HERNIA REPAIR WITH MESH;  Surgeon: Pamela Miyamoto, MD;  Location: [facility] OR;  Service: General;  Laterality: N/A;    Family History  Problem Relation Age of Onset   Alcohol abuse Mother    Depression Mother    Early death Mother    Liver disease Mother    Alcohol abuse Father    Obesity Father    Cancer Father     Social:  reports that she has been smoking cigarettes. She has a 7.50 pack-year smoking history. She has never used smokeless tobacco. She reports that she does not drink alcohol and does not use drugs.  Allergies: No Known Allergies  Medications: Current Outpatient Medications  Medication Instructions   albuterol (VENTOLIN HFA) 108 (90 Base) MCG/ACT inhaler INHALE 1 TO 2 PUFFS INTO THE LUNGS EVERY 6 HOURS AS NEEDED FOR WHEEZING OR SHORTNESS OF BREATH   atorvastatin (LIPITOR) 20 mg, Oral, Daily   Budeson-Glycopyrrol-Formoterol  (BREZTRI AEROSPHERE) 160-9-4.8 MCG/ACT AERO Inhale 2 puffs by mouth twice daily. Rinse mouth out after use.   clobetasol (TEMOVATE) 0.05 % external solution 1 Application, Topical, 2 times daily PRN   dicyclomine (BENTYL) 10 MG capsule Take 1 tablet by mouth every 6 hours as needed for abdominal cramping.   hydrOXYzine (VISTARIL) 25 MG capsule Take 1 capsule by mouth 3 times daily as needed   ibuprofen (ADVIL) 600-800 mg, Oral, Every 6 hours PRN   levothyroxine (SYNTHROID) 150 mcg, Oral, Daily   Melatonin 10 mg, Oral, Daily at bedtime   modafinil (PROVIGIL) 200 MG tablet Take 1 tablet by mouth every morning   Multiple Vitamin (MULTIVITAMIN WITH MINERALS) TABS tablet 1 tablet, Oral, Daily   omeprazole (PRILOSEC) 40 MG capsule TAKE 1 CAPSULE(40 MG) BY MOUTH DAILY AS NEEDED FOR HEARTBURN   Semaglutide-Weight Management 2.4 mg, Subcutaneous, Weekly    ROS - all of the below systems have been reviewed with the patient and positives are indicated with bold text General: chills, fever or night sweats Eyes: blurry vision or double vision ENT: epistaxis or sore throat Allergy/Immunology: itchy/watery eyes or nasal congestion Hematologic/Lymphatic: bleeding problems, blood clots or swollen lymph nodes Endocrine: temperature intolerance or unexpected weight changes Breast: new or changing breast lumps or  nipple discharge Resp: cough, shortness of breath, or wheezing CV: chest pain or dyspnea on exertion GI: as per HPI GU: dysuria, trouble voiding, or hematuria MSK: joint pain or joint stiffness Neuro: TIA or stroke symptoms Derm: pruritus and skin lesion changes Psych: anxiety and depression  Objective   PE Blood pressure 118/70, pulse 77, temperature 98.3 F (36.8 C), temperature source Oral, resp. rate 16, height  (1.626 m), weight 118.8 kg, SpO2 92 %. Constitutional: NAD; conversant; no deformities Eyes: Moist conjunctiva; no lid lag; anicteric; PERRL Neck: Trachea midline; no  thyromegaly Lungs: Normal respiratory effort; no tactile fremitus CV: RRR; no palpable thrills; no pitting edema GI: Abd Soft, nontender; no palpable hepatosplenomegaly MSK: Normal range of motion of extremities; no clubbing/cyanosis Psychiatric: Appropriate affect; alert and oriented x3 Lymphatic: No palpable cervical or axillary lymphadenopathy  No results found for this or any previous visit (from the past 24 hour(s)).  Imaging Orders  No imaging studies ordered today     Assessment and Plan   [name] has a 3.8 cm wide by 3.8 cm tall recurrent incarcerated incisional hernia at her previous Lap-Band port insertion site. This hernia is complicated by this adjustable gastric band. We discussed the pathophysiology of hernias. She has not used the Lap-Band in a long time and would like it removed. I feel removing the Lap-Band will allow me to provide better overlap of the hernia defect and a more lasting repair. I recommended laparoscopic Lap-Band removal followed by robotic, possibly open ventral hernia repair with mesh. We discussed the procedure itself as well as its risk, benefits, and alternatives. I explained the risk of possibly needing to stage the procedure with Lap-Band removal first and ventral hernia repair later if it is difficult to remove the Lap-Band. I am concerned about this hernia as it has a relatively narrow neck and a large amount of the transverse colon and she has obstructive symptoms related to it. After full discussion of all the procedure, its risk, benefits, and alternatives, the patient granted consent to proceed. We will proceed as scheduled.     Pamela Ore, MD  [facility] Use [email or web address] to contact on call provider

## 2023-03-20 ENCOUNTER — Encounter (HOSPITAL_COMMUNITY): Payer: Self-pay | Admitting: Surgery

## 2023-03-29 ENCOUNTER — Other Ambulatory Visit: Payer: Self-pay

## 2023-04-08 NOTE — Telephone Encounter (Signed)
Called pt to check-in and see how she was doing post-op.  Pt stated overall she is doing well. She has been having some random cramps taking place that occurs irregardless of food/fluid intake. The pt thinks it may be related to her hernia repair. Encouraged pt to reach out to the surgeon if she is still concerned or does not find relief. Pt receptive. Pt to follow-up with surgeon this week.   Thank you,  Lubertha Basque, RN, MSN Bariatric Nurse Coordinator [phone] (office)

## 2023-04-16 ENCOUNTER — Encounter: Payer: Self-pay | Admitting: Family Medicine

## 2023-04-17 ENCOUNTER — Other Ambulatory Visit: Payer: Self-pay | Admitting: Family Medicine

## 2023-04-17 DIAGNOSIS — R109 Unspecified abdominal pain: Secondary | ICD-10-CM

## 2023-04-17 MED ORDER — DICYCLOMINE HCL 10 MG PO CAPS
ORAL_CAPSULE | ORAL | 0 refills | Status: DC
Start: 2023-04-17 — End: 2023-09-30

## 2023-05-02 ENCOUNTER — Ambulatory Visit (INDEPENDENT_AMBULATORY_CARE_PROVIDER_SITE_OTHER): Payer: BC Managed Care – PPO | Admitting: Family

## 2023-05-02 ENCOUNTER — Encounter: Payer: Self-pay | Admitting: Family

## 2023-05-02 ENCOUNTER — Telehealth: Payer: Self-pay

## 2023-05-02 ENCOUNTER — Other Ambulatory Visit: Payer: Self-pay | Admitting: Family

## 2023-05-02 VITALS — BP 164/100 | HR 95 | Ht 64.0 in | Wt 261.0 lb

## 2023-05-02 DIAGNOSIS — R590 Localized enlarged lymph nodes: Secondary | ICD-10-CM

## 2023-05-02 DIAGNOSIS — J029 Acute pharyngitis, unspecified: Secondary | ICD-10-CM | POA: Diagnosis not present

## 2023-05-02 LAB — CBC WITH DIFFERENTIAL/PLATELET
Basophils Absolute: 0.1 10*3/uL (ref 0.0–0.1)
Basophils Relative: 0.9 % (ref 0.0–3.0)
Eosinophils Absolute: 0.1 10*3/uL (ref 0.0–0.7)
Eosinophils Relative: 0.7 % (ref 0.0–5.0)
HCT: 42.7 % (ref 36.0–46.0)
Hemoglobin: 13.9 g/dL (ref 12.0–15.0)
Lymphocytes Relative: 14.2 % (ref 12.0–46.0)
Lymphs Abs: 1.7 10*3/uL (ref 0.7–4.0)
MCHC: 32.5 g/dL (ref 30.0–36.0)
MCV: 89.3 fl (ref 78.0–100.0)
Monocytes Absolute: 0.7 10*3/uL (ref 0.1–1.0)
Monocytes Relative: 5.7 % (ref 3.0–12.0)
Neutro Abs: 9.7 10*3/uL — ABNORMAL HIGH (ref 1.4–7.7)
Neutrophils Relative %: 78.5 % — ABNORMAL HIGH (ref 43.0–77.0)
Platelets: 375 10*3/uL (ref 150.0–400.0)
RBC: 4.78 Mil/uL (ref 3.87–5.11)
RDW: 15.5 % (ref 11.5–15.5)
WBC: 12.3 10*3/uL — ABNORMAL HIGH (ref 4.0–10.5)

## 2023-05-02 MED ORDER — LIDOCAINE VISCOUS HCL 2 % MT SOLN: 15.0000 mL | Freq: Four times a day (QID) | OROMUCOSAL | 0 refills | Status: DC | PRN

## 2023-05-02 MED ORDER — AZITHROMYCIN 250 MG PO TABS: ORAL_TABLET | ORAL | 0 refills | Status: DC

## 2023-05-02 NOTE — Progress Notes (Signed)
Pamela [name] is a 62 y.o. female with the following history as recorded in EpicCare:  Patient Active Problem List   Diagnosis Date Noted   Strain of right biceps 04/03/2021   Bilateral hearing loss 04/03/2021   Prediabetes 04/03/2021   Other hyperlipidemia 04/03/2021   Vitamin D deficiency 06/23/2018   Insulin resistance 06/23/2018   Morbid obesity (HCC) 03/13/2018   COPD (chronic obstructive pulmonary disease) (HCC)    Chronic obstructive pulmonary disease (HCC) 01/27/2018   Chronic pain of left ankle 01/27/2018   Hypothyroidism 01/27/2018    Current Outpatient Medications  Medication Sig Dispense Refill   albuterol (VENTOLIN HFA) 108 (90 Base) MCG/ACT inhaler INHALE 1 TO 2 PUFFS INTO THE LUNGS EVERY 6 HOURS AS NEEDED FOR WHEEZING OR SHORTNESS OF BREATH 18 g 5   atorvastatin (LIPITOR) 20 MG tablet Take 1 tablet (20 mg total) by mouth daily. 90 tablet 2   azithromycin (ZITHROMAX Z-PAK) 250 MG tablet Take 2 tablets (500 mg) PO today, then 1 tablet (250 mg) PO daily x4 days. 6 tablet 0   Budeson-Glycopyrrol-Formoterol (BREZTRI AEROSPHERE) 160-9-4.8 MCG/ACT AERO Inhale 2 puffs by mouth twice daily. Rinse mouth out after use. 10.7 g 5   clobetasol (TEMOVATE) 0.05 % external solution Apply 1 Application topically 2 (two) times daily as needed (irritation). 50 mL 1   dicyclomine (BENTYL) 10 MG capsule Take 1 tablet by mouth every 6 hours as needed for abdominal cramping. 60 capsule 0   hydrOXYzine (VISTARIL) 25 MG capsule Take 1 capsule by mouth 3 times daily as needed 90 capsule 4   ibuprofen (ADVIL) 200 MG tablet Take 600-800 mg by mouth every 6 (six) hours as needed for moderate pain.     levothyroxine (SYNTHROID) 150 MCG tablet Take 1 tablet (150 mcg total) by mouth daily. 90 tablet 2   lidocaine (XYLOCAINE) 2 % solution Use as directed 15 mLs in the mouth or throat every 6 (six) hours as needed for mouth pain. Gargle and spit 100 mL 0   Melatonin 10 MG CAPS Take 10 mg by mouth at  bedtime.     modafinil (PROVIGIL) 200 MG tablet Take 1 tablet by mouth every morning 30 tablet 4   Multiple Vitamin (MULTIVITAMIN WITH MINERALS) TABS tablet Take 1 tablet by mouth daily.     omeprazole (PRILOSEC) 40 MG capsule TAKE 1 CAPSULE(40 MG) BY MOUTH DAILY AS NEEDED FOR HEARTBURN 90 capsule 1   No current facility-administered medications for this visit.    Allergies: Patient has no known allergies.  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)   History of blood transfusion 2006   HLD (hyperlipidemia)    Hypothyroidism    Obesity    Pneumonia    Sleep apnea    cpap   Thyroid disease     Past Surgical History:  Procedure Laterality Date   AUGMENTATION MAMMAPLASTY Bilateral 1987   Saline   BREAST BIOPSY     INSERTION OF MESH N/A 07/16/2019   Procedure: Insertion Of Mesh;  Surgeon: Abigail Miyamoto, MD;  Location: [facility] OR;  Service: General;  Laterality: N/A;  LAPAROSCOPIC GASTRIC BANDING  2010   VENTRAL HERNIA REPAIR N/A 07/16/2019   Procedure: VENTRAL INCISIONAL HERNIA REPAIR WITH MESH;  Surgeon: Abigail Miyamoto, MD;  Location: [facility] OR;  Service: General;  Laterality: N/A;   XI ROBOTIC ASSISTED VENTRAL HERNIA N/A 03/19/2023   Procedure: OPEN PRIMARY CLOSURE OF RECURRENT INCISIONAL HERNIA AND REMOVAL OF INTRAPERITONEAL MESH;  Surgeon: Quentin Ore, MD;  Location: [facility] ORS;  Service: General;  Laterality: N/A;    Family History  Problem Relation Age of Onset   Alcohol abuse Mother    Depression Mother    Early death Mother    Liver disease Mother    Alcohol abuse Father    Obesity Father    Cancer Father     Social History   Tobacco Use   Smoking status: Some Days    Packs/day: 0.25    Years:  30.00    Additional pack years: 0.00    Total pack years: 7.50    Types: Cigarettes   Smokeless tobacco: Never   Tobacco comments:    Quit for 3 years  Substance Use Topics   Alcohol use: No    Comment: 13 years sober    Subjective:   Right ear pain/ right sided sore throat x 1 day; started suddenly this morning; no fever; painful to swallow; feels like right side of neck is swollen; did re-start smoking while recently recovering from recent surgery;   Notes she is not concerned about her blood pressure being elevated today- feels this is due to the amount of pain she is in and the stimulant medication she takes;  Objective:  Vitals:   05/02/23 1056  BP: (!) 164/100  Pulse: 95  SpO2: 95%  Weight: 261 lb (118.4 kg)  Height: 5\' 4"  (1.626 m)    General: Well developed, well nourished, in no acute distress  Skin : Warm and dry.  Head: Normocephalic and atraumatic  Eyes: Sclera and conjunctiva clear; pupils round and reactive to light; extraocular movements intact  Ears: External normal; canals clear; tympanic membranes normal  Oropharynx: Pink, supple. No suspicious lesions; cervical lymph node is noted;  Neck: Supple without thyromegaly, adenopathy  Lungs: Respirations unlabored; wheezing noted CVS exam: normal rate and regular rhythm.  Neurologic: Alert and oriented; speech intact; face symmetrical; moves all extremities well; CNII-XII intact without focal deficit   Assessment:  1. Sore throat   2. Reactive cervical lymphadenopathy     Plan:   Check CBC today; Rx for Z-pak #1- take as directed; Rx for viscous lidocaine given as well; increase fluids, rest and follow up worse, no better   No follow-ups on file.  Orders Placed This Encounter  Procedures   CBC with Differential/Platelet    Requested Prescriptions   Signed Prescriptions Disp Refills   azithromycin (ZITHROMAX Z-PAK) 250 MG tablet 6 tablet 0    Sig: Take 2 tablets (500 mg) PO today, then 1 tablet (250  mg) PO daily x4 days.   lidocaine (XYLOCAINE) 2 % solution 100 mL 0    Sig: Use as directed 15 mLs in the mouth or throat every 6 (six) hours as needed for mouth pain. Gargle and spit

## 2023-05-02 NOTE — Telephone Encounter (Signed)
Caller Name Demaya Janoff Caller Phone Number [phone] Patient Name Pamela [name] Patient DOB 08-Mar-1961 Call Type Message Only Information Provided Reason for Call Request to Schedule Office Appointment Initial Comment Caller states she have an ear ache and have cotton in her ear. She want to schedule an appointment first thing this morning Disp. Time Disposition Final User 05/02/2023 7:12:20 AM General Information Provided Yes Darryl Lent Call Closed By: Darryl Lent Transaction Date/Time: 05/02/2023 7:07:10 AM (ET)

## 2023-05-02 NOTE — Telephone Encounter (Signed)
Pt has an appointment today

## 2023-05-03 ENCOUNTER — Encounter (HOSPITAL_BASED_OUTPATIENT_CLINIC_OR_DEPARTMENT_OTHER): Payer: Self-pay | Admitting: Emergency Medicine

## 2023-05-03 ENCOUNTER — Inpatient Hospital Stay (HOSPITAL_BASED_OUTPATIENT_CLINIC_OR_DEPARTMENT_OTHER)
Admission: EM | Admit: 2023-05-03 | Discharge: 2023-05-05 | DRG: 153 | Disposition: A | Discharge status: Home / Self Care | Payer: BC Managed Care – PPO | Source: Other Acute Inpatient Hospital | Attending: Student | Admitting: Student

## 2023-05-03 ENCOUNTER — Other Ambulatory Visit: Payer: Self-pay

## 2023-05-03 ENCOUNTER — Emergency Department (HOSPITAL_BASED_OUTPATIENT_CLINIC_OR_DEPARTMENT_OTHER): Payer: BC Managed Care – PPO

## 2023-05-03 ENCOUNTER — Encounter: Payer: Self-pay | Admitting: Family

## 2023-05-03 DIAGNOSIS — E785 Hyperlipidemia, unspecified: Secondary | ICD-10-CM | POA: Diagnosis present

## 2023-05-03 DIAGNOSIS — F17210 Nicotine dependence, cigarettes, uncomplicated: Secondary | ICD-10-CM | POA: Diagnosis present

## 2023-05-03 DIAGNOSIS — Z811 Family history of alcohol abuse and dependence: Secondary | ICD-10-CM | POA: Diagnosis not present

## 2023-05-03 DIAGNOSIS — Z7982 Long term (current) use of aspirin: Secondary | ICD-10-CM | POA: Diagnosis not present

## 2023-05-03 DIAGNOSIS — Z86010 Personal history of colonic polyps: Secondary | ICD-10-CM

## 2023-05-03 DIAGNOSIS — K148 Other diseases of tongue: Secondary | ICD-10-CM | POA: Diagnosis not present

## 2023-05-03 DIAGNOSIS — F32A Depression, unspecified: Secondary | ICD-10-CM | POA: Diagnosis present

## 2023-05-03 DIAGNOSIS — Z818 Family history of other mental and behavioral disorders: Secondary | ICD-10-CM

## 2023-05-03 DIAGNOSIS — R030 Elevated blood-pressure reading, without diagnosis of hypertension: Secondary | ICD-10-CM | POA: Diagnosis present

## 2023-05-03 DIAGNOSIS — E876 Hypokalemia: Secondary | ICD-10-CM | POA: Diagnosis not present

## 2023-05-03 DIAGNOSIS — R070 Pain in throat: Secondary | ICD-10-CM | POA: Diagnosis present

## 2023-05-03 DIAGNOSIS — E039 Hypothyroidism, unspecified: Secondary | ICD-10-CM | POA: Diagnosis present

## 2023-05-03 DIAGNOSIS — J36 Peritonsillar abscess: Principal | ICD-10-CM | POA: Diagnosis present

## 2023-05-03 DIAGNOSIS — Z79890 Hormone replacement therapy (postmenopausal): Secondary | ICD-10-CM

## 2023-05-03 DIAGNOSIS — J4489 Other specified chronic obstructive pulmonary disease: Secondary | ICD-10-CM | POA: Diagnosis present

## 2023-05-03 DIAGNOSIS — Q892 Congenital malformations of other endocrine glands: Secondary | ICD-10-CM

## 2023-05-03 DIAGNOSIS — G4733 Obstructive sleep apnea (adult) (pediatric): Secondary | ICD-10-CM | POA: Diagnosis present

## 2023-05-03 DIAGNOSIS — K149 Disease of tongue, unspecified: Secondary | ICD-10-CM | POA: Diagnosis present

## 2023-05-03 DIAGNOSIS — Z8701 Personal history of pneumonia (recurrent): Secondary | ICD-10-CM

## 2023-05-03 DIAGNOSIS — Z79899 Other long term (current) drug therapy: Secondary | ICD-10-CM | POA: Diagnosis not present

## 2023-05-03 DIAGNOSIS — Z7951 Long term (current) use of inhaled steroids: Secondary | ICD-10-CM | POA: Diagnosis not present

## 2023-05-03 DIAGNOSIS — K219 Gastro-esophageal reflux disease without esophagitis: Secondary | ICD-10-CM | POA: Diagnosis present

## 2023-05-03 DIAGNOSIS — Z6841 Body Mass Index (BMI) 40.0 and over, adult: Secondary | ICD-10-CM | POA: Diagnosis not present

## 2023-05-03 DIAGNOSIS — J449 Chronic obstructive pulmonary disease, unspecified: Secondary | ICD-10-CM | POA: Diagnosis not present

## 2023-05-03 DIAGNOSIS — K1120 Sialoadenitis, unspecified: Secondary | ICD-10-CM | POA: Diagnosis present

## 2023-05-03 DIAGNOSIS — J0510 Acute epiglottitis without obstruction: Secondary | ICD-10-CM

## 2023-05-03 DIAGNOSIS — F419 Anxiety disorder, unspecified: Secondary | ICD-10-CM | POA: Diagnosis present

## 2023-05-03 DIAGNOSIS — Z9989 Dependence on other enabling machines and devices: Secondary | ICD-10-CM | POA: Diagnosis not present

## 2023-05-03 LAB — CBC WITH DIFFERENTIAL/PLATELET
Abs Immature Granulocytes: 0.05 10*3/uL (ref 0.00–0.07)
Basophils Absolute: 0.1 10*3/uL (ref 0.0–0.1)
Basophils Relative: 1 %
Eosinophils Absolute: 0.1 10*3/uL (ref 0.0–0.5)
Eosinophils Relative: 1 %
HCT: 39.8 % (ref 36.0–46.0)
Hemoglobin: 13.3 g/dL (ref 12.0–15.0)
Immature Granulocytes: 0 %
Lymphocytes Relative: 10 %
Lymphs Abs: 1.5 10*3/uL (ref 0.7–4.0)
MCH: 29.4 pg (ref 26.0–34.0)
MCHC: 33.4 g/dL (ref 30.0–36.0)
MCV: 87.9 fL (ref 80.0–100.0)
Monocytes Absolute: 1 10*3/uL (ref 0.1–1.0)
Monocytes Relative: 7 %
Neutro Abs: 12.1 10*3/uL — ABNORMAL HIGH (ref 1.7–7.7)
Neutrophils Relative %: 81 %
Platelets: 304 10*3/uL (ref 150–400)
RBC: 4.53 MIL/uL (ref 3.87–5.11)
RDW: 15.2 % (ref 11.5–15.5)
WBC: 14.8 10*3/uL — ABNORMAL HIGH (ref 4.0–10.5)
nRBC: 0 % (ref 0.0–0.2)

## 2023-05-03 LAB — BASIC METABOLIC PANEL
Anion gap: 8 (ref 5–15)
BUN: 10 mg/dL (ref 8–23)
CO2: 25 mmol/L (ref 22–32)
Calcium: 8.1 mg/dL — ABNORMAL LOW (ref 8.9–10.3)
Chloride: 101 mmol/L (ref 98–111)
Creatinine, Ser: 0.67 mg/dL (ref 0.44–1.00)
GFR, Estimated: >60 mL/min (ref >60)
Glucose, Bld: 130 mg/dL — ABNORMAL HIGH (ref 70–99)
Potassium: 3.8 mmol/L (ref 3.5–5.1)
Sodium: 134 mmol/L — ABNORMAL LOW (ref 135–145)

## 2023-05-03 MED ORDER — SODIUM CHLORIDE 0.9% FLUSH
3.0000 mL | Freq: Two times a day (BID) | INTRAVENOUS | Status: DC
  Administered 2023-05-03 – 2023-05-05 (×3): 3 mL via INTRAVENOUS

## 2023-05-03 MED ORDER — SODIUM CHLORIDE 0.9 % IV SOLN
3.0000 g | Freq: Four times a day (QID) | INTRAVENOUS | Status: DC
  Administered 2023-05-03 – 2023-05-05 (×7): 3 g via INTRAVENOUS
  Filled 2023-05-03 (×7): qty 8

## 2023-05-03 MED ORDER — ATORVASTATIN CALCIUM 10 MG PO TABS
20.0000 mg | ORAL_TABLET | Freq: Every day | ORAL | Status: DC
  Administered 2023-05-03 – 2023-05-04 (×2): 20 mg via ORAL
  Filled 2023-05-03 (×3): qty 2

## 2023-05-03 MED ORDER — DEXAMETHASONE SODIUM PHOSPHATE 10 MG/ML IJ SOLN
10.0000 mg | Freq: Once | INTRAMUSCULAR | Status: AC
  Administered 2023-05-03: 10 mg via INTRAVENOUS
  Filled 2023-05-03: qty 1

## 2023-05-03 MED ORDER — ACETAMINOPHEN 325 MG PO TABS: 650.0000 mg | ORAL_TABLET | Freq: Four times a day (QID) | ORAL | Status: DC | PRN

## 2023-05-03 MED ORDER — MELATONIN 10 MG PO CAPS: 10.0000 mg | ORAL_CAPSULE | Freq: Every day | ORAL | Status: DC

## 2023-05-03 MED ORDER — MELATONIN 5 MG PO TABS
10.0000 mg | ORAL_TABLET | Freq: Every day | ORAL | Status: DC
  Administered 2023-05-03 – 2023-05-04 (×2): 10 mg via ORAL
  Filled 2023-05-03 (×2): qty 2

## 2023-05-03 MED ORDER — ACETAMINOPHEN 650 MG RE SUPP: 650.0000 mg | Freq: Four times a day (QID) | RECTAL | Status: DC | PRN

## 2023-05-03 MED ORDER — HYDROXYZINE HCL 25 MG PO TABS
25.0000 mg | ORAL_TABLET | Freq: Three times a day (TID) | ORAL | Status: DC | PRN
  Administered 2023-05-05: 25 mg via ORAL
  Filled 2023-05-03: qty 1

## 2023-05-03 MED ORDER — PANTOPRAZOLE SODIUM 40 MG PO TBEC
40.0000 mg | DELAYED_RELEASE_TABLET | Freq: Every day | ORAL | Status: DC
  Administered 2023-05-03 – 2023-05-05 (×3): 40 mg via ORAL
  Filled 2023-05-03 (×3): qty 1

## 2023-05-03 MED ORDER — OXYCODONE HCL 5 MG PO TABS: 5.0000 mg | ORAL_TABLET | ORAL | Status: DC | PRN

## 2023-05-03 MED ORDER — HYDROMORPHONE HCL 1 MG/ML IJ SOLN
0.5000 mg | INTRAMUSCULAR | Status: DC | PRN
  Administered 2023-05-03 – 2023-05-04 (×2): 0.5 mg via INTRAVENOUS
  Filled 2023-05-03 (×2): qty 0.5

## 2023-05-03 MED ORDER — HYDROMORPHONE HCL 1 MG/ML IJ SOLN
1.0000 mg | Freq: Once | INTRAMUSCULAR | Status: AC
  Administered 2023-05-03: 1 mg via INTRAVENOUS
  Filled 2023-05-03: qty 1

## 2023-05-03 MED ORDER — ALBUTEROL SULFATE (2.5 MG/3ML) 0.083% IN NEBU: 2.5000 mg | INHALATION_SOLUTION | Freq: Four times a day (QID) | RESPIRATORY_TRACT | Status: DC | PRN

## 2023-05-03 MED ORDER — SODIUM CHLORIDE 0.9 % IV SOLN
3.0000 g | Freq: Once | INTRAVENOUS | Status: AC
  Administered 2023-05-03: 3 g via INTRAVENOUS

## 2023-05-03 MED ORDER — MOMETASONE FURO-FORMOTEROL FUM 200-5 MCG/ACT IN AERO
2.0000 | INHALATION_SPRAY | Freq: Two times a day (BID) | RESPIRATORY_TRACT | Status: DC
  Administered 2023-05-04 – 2023-05-05 (×3): 2 via RESPIRATORY_TRACT
  Filled 2023-05-03: qty 8.8

## 2023-05-03 MED ORDER — IOHEXOL 300 MG/ML  SOLN
100.0000 mL | Freq: Once | INTRAMUSCULAR | Status: AC | PRN
  Administered 2023-05-03: 75 mL via INTRAVENOUS

## 2023-05-03 MED ORDER — BUDESON-GLYCOPYRROL-FORMOTEROL 160-9-4.8 MCG/ACT IN AERO: 2.0000 | INHALATION_SPRAY | Freq: Two times a day (BID) | RESPIRATORY_TRACT | Status: DC

## 2023-05-03 MED ORDER — LEVOTHYROXINE SODIUM 75 MCG PO TABS
150.0000 ug | ORAL_TABLET | Freq: Every day | ORAL | Status: DC
  Administered 2023-05-04 – 2023-05-05 (×2): 150 ug via ORAL
  Filled 2023-05-03 (×2): qty 2

## 2023-05-03 MED ORDER — ALBUTEROL SULFATE HFA 108 (90 BASE) MCG/ACT IN AERS: 1.0000 | INHALATION_SPRAY | Freq: Four times a day (QID) | RESPIRATORY_TRACT | Status: DC | PRN

## 2023-05-03 MED ORDER — DICYCLOMINE HCL 10 MG PO CAPS: 10.0000 mg | ORAL_CAPSULE | Freq: Four times a day (QID) | ORAL | Status: DC | PRN

## 2023-05-03 MED ORDER — MORPHINE SULFATE (PF) 4 MG/ML IV SOLN
4.0000 mg | Freq: Once | INTRAVENOUS | Status: AC
  Administered 2023-05-03: 4 mg via INTRAVENOUS
  Filled 2023-05-03: qty 1

## 2023-05-03 MED ORDER — ONDANSETRON HCL 4 MG PO TABS: 4.0000 mg | ORAL_TABLET | Freq: Four times a day (QID) | ORAL | Status: DC | PRN

## 2023-05-03 MED ORDER — BUSPIRONE HCL 5 MG PO TABS
5.0000 mg | ORAL_TABLET | Freq: Three times a day (TID) | ORAL | Status: DC
  Administered 2023-05-03 – 2023-05-05 (×5): 5 mg via ORAL
  Filled 2023-05-03 (×5): qty 1

## 2023-05-03 MED ORDER — UMECLIDINIUM BROMIDE 62.5 MCG/ACT IN AEPB
1.0000 | INHALATION_SPRAY | Freq: Every day | RESPIRATORY_TRACT | Status: DC
  Filled 2023-05-03: qty 7

## 2023-05-03 MED ORDER — KETOROLAC TROMETHAMINE 15 MG/ML IJ SOLN
15.0000 mg | Freq: Once | INTRAMUSCULAR | Status: AC
  Administered 2023-05-03: 15 mg via INTRAVENOUS
  Filled 2023-05-03: qty 1

## 2023-05-03 MED ORDER — ONDANSETRON HCL 4 MG/2ML IJ SOLN: 4.0000 mg | Freq: Four times a day (QID) | INTRAMUSCULAR | Status: DC | PRN

## 2023-05-03 NOTE — ED Notes (Signed)
Report attempted x2. Advised to call back in 5 min

## 2023-05-03 NOTE — ED Triage Notes (Signed)
Went to PCP yesterday and was dx with strep. Reports taking 2 doses of Z-pack. Complains of pain in throat and right ear worsening.

## 2023-05-03 NOTE — Telephone Encounter (Signed)
Pt called back, advised her that the zpak is to cover strep but that she should go to the ED for further evaluation in case she has an abscess further down her throat where laura could not see. Pt understands

## 2023-05-03 NOTE — H&P (Addendum)
History and Physical    [name] ZOX:[id number] DOB: [birth year] DOA: 05/03/2023  PCP: [name], DO   Patient coming from: Home   Chief Complaint: Right neck pain and swelling   HPI: Pamela [name] is a 62 y.o. female with medical history significant for COPD, depression, anxiety, hyperlipidemia, and hypothyroidism who presents to the emergency department with worsening right neck pain and swelling.  Patient states that she woke yesterday morning with right-sided throat pain and right neck swelling.  She was seen in outpatient clinic for this, prescribed azithromycin and viscous lidocaine, but continued to have increasing pain which was severe overnight and today.  She contacted the outpatient clinic to report her worsening pain and was directed to the ED.  She has not noticed any fever or chills.  She denies any recent change in her chronic cough and dyspnea.  She has some pain with swallowing but no drooling, change in voice, or noisy breathing.  [facility] ED Course: Upon arrival to the ED, patient is found to be afebrile and saturating mid 90s on room air with normal heart rate and elevated blood pressure.  Labs are notable for WBC 14,800.  Neck CT demonstrates diffuse tonsillar hypertrophy with 1.4 x 0.9 cm fluid collection, findings suggestive of epiglottitis, and nonspecific lesion at the base of her tongue.  ENT (Dr. Jearld Fenton) was consulted by the ED physician and recommended medical admission with conservative management given the small size of abscess.  Patient was treated with Unasyn, Decadron, Toradol, and morphine in the ED.  She was transferred to [facility] for admission.  Review of Systems:  All other systems reviewed and apart from HPI, are negative.  Past Medical History:  Diagnosis Date   Anemia    Asthma    Blood in stool    Colon polyps    COPD (chronic obstructive pulmonary disease) (HCC)    Depression    Depression    GERD  (gastroesophageal reflux disease)    Heart murmur    childhood   History of alcohol abuse    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)   History of blood transfusion 2006   HLD (hyperlipidemia)    Hypothyroidism    Obesity    Pneumonia    Sleep apnea    cpap   Thyroid disease     Past Surgical History:  Procedure Laterality Date   AUGMENTATION MAMMAPLASTY Bilateral 1987   Saline   BREAST BIOPSY     INSERTION OF MESH N/A 07/16/2019   Procedure: Insertion Of Mesh;  Surgeon: Abigail Miyamoto, MD;  Location: [facility] OR;  Service: General;  Laterality: N/A;   LAPAROSCOPIC GASTRIC BANDING  2010   VENTRAL HERNIA REPAIR N/A 07/16/2019   Procedure: VENTRAL INCISIONAL HERNIA REPAIR WITH MESH;  Surgeon: Abigail Miyamoto, MD;  Location: [facility] OR;  Service: General;  Laterality: N/A;   XI ROBOTIC ASSISTED VENTRAL HERNIA N/A 03/19/2023   Procedure: OPEN PRIMARY CLOSURE OF RECURRENT INCISIONAL HERNIA AND REMOVAL OF INTRAPERITONEAL MESH;  Surgeon: Quentin Ore, MD;  Location: [facility] ORS;  Service: General;  Laterality: N/A;    Social History:   reports that she has been smoking cigarettes. She has a 7.50 pack-year smoking history. She has never used smokeless tobacco. She reports that she does not drink alcohol and does not use drugs.  No Known Allergies  Family History  Problem Relation Age of Onset   Alcohol abuse Mother    Depression Mother  Early death Mother    Liver disease Mother    Alcohol abuse Father    Obesity Father    Cancer Father      Prior to Admission medications   Medication Sig Start Date End Date Taking? Authorizing Provider  acetaminophen (TYLENOL) 500 MG tablet Take 1,000 mg by mouth daily as needed for moderate pain. 04/05/23  Yes [provider]  albuterol (VENTOLIN HFA) 108 (90 Base) MCG/ACT inhaler INHALE 1 TO 2 PUFFS INTO THE LUNGS EVERY 6 HOURS AS NEEDED FOR WHEEZING OR SHORTNESS OF BREATH Patient taking differently: Inhale 1-2 puffs into the lungs every 6  (six) hours as needed for wheezing or shortness of breath. 12/14/22  Yes [name], DO  aspirin EC 81 MG tablet Take 81 mg by mouth daily. Swallow whole.   Yes [provider]  atorvastatin (LIPITOR) 20 MG tablet Take 1 tablet (20 mg total) by mouth daily. Patient taking differently: Take 20 mg by mouth at bedtime. 12/11/22  Yes Wendling, [name], DO  azithromycin (ZITHROMAX Z-PAK) 250 MG tablet Take 2 tablets (500 mg) PO today, then 1 tablet (250 mg) PO daily x4 days. 05/02/23  Yes Olive Bass, FNP  Budeson-Glycopyrrol-Formoterol (BREZTRI AEROSPHERE) 160-9-4.8 MCG/ACT AERO Inhale 2 puffs by mouth twice daily. Rinse mouth out after use. 08/24/22  Yes Wendling, [name], DO  busPIRone (BUSPAR) 5 MG tablet Take 5 mg by mouth 3 (three) times daily. 04/23/23  Yes [provider]  clobetasol (TEMOVATE) 0.05 % external solution Apply 1 Application topically 2 (two) times daily as needed (irritation). 10/29/22  Yes [name], DO  dicyclomine (BENTYL) 10 MG capsule Take 1 tablet by mouth every 6 hours as needed for abdominal cramping. Patient taking differently: Take 10 mg by mouth every 6 (six) hours as needed for spasms (abdominal cramping). 04/17/23  Yes [name], DO  hydrOXYzine (VISTARIL) 25 MG capsule Take 1 capsule by mouth 3 times daily as needed Patient taking differently: Take 25 mg by mouth 3 (three) times daily as needed for anxiety. 01/05/22  Yes   ibuprofen (ADVIL) 200 MG tablet Take 600-800 mg by mouth every 6 (six) hours as needed for moderate pain.   Yes [provider]  levothyroxine (SYNTHROID) 150 MCG tablet Take 1 tablet (150 mcg total) by mouth daily. 12/11/22  Yes [name], DO  lidocaine (XYLOCAINE) 2 % solution Use as directed 15 mLs in the mouth or throat every 6 (six) hours as needed for mouth pain. Gargle and spit 05/02/23  Yes Olive Bass, FNP  Melatonin 10 MG CAPS Take 10 mg  by mouth at bedtime.   Yes [provider]  modafinil (PROVIGIL) 200 MG tablet Take 1 tablet by mouth every morning Patient taking differently: Take 200 mg by mouth in the morning. 01/05/22  Yes   Multiple Vitamin (MULTIVITAMIN WITH MINERALS) TABS tablet Take 1 tablet by mouth daily.   Yes [provider]  omeprazole (PRILOSEC) 40 MG capsule TAKE 1 CAPSULE(40 MG) BY MOUTH DAILY AS NEEDED FOR HEARTBURN Patient taking differently: Take 40 mg by mouth daily as needed (heartburn). 12/11/22  Yes [name], DO    Physical Exam: Vitals:   05/03/23 1545 05/03/23 1600 05/03/23 1630 05/03/23 1903  BP: (!) 145/100 (!) 154/100 (!) 153/94 (!) 137/108  Pulse: 90 87 86 90  Resp: (!) 24 18 (!) 21 16  Temp:    98.6 F (37 C)  TempSrc:    Oral  SpO2: 96% 91% 95% 94%  Weight:      Height:        Constitutional: NAD, no pallor or cyanosis Eyes: PERTLA, lids and conjunctivae normal ENMT: Mucous membranes are moist. Posterior pharynx clear of any exudate or lesions.   Neck: Tenderness and swelling on right. PROM intact.   Respiratory: no wheezing, no crackles. No accessory muscle use.  Cardiovascular: S1 & S2 heard, regular rate and rhythm. No JVD. Abdomen: No distension, no tenderness, soft. Bowel sounds active.  Musculoskeletal: no clubbing / cyanosis. No joint deformity upper and lower extremities.   Skin: no significant rashes, lesions, ulcers. Warm, dry, well-perfused. Neurologic: CN 2-12 grossly intact. Moving all extremities. Alert and oriented.  Psychiatric: Calm. Cooperative.    Labs and Imaging on Admission: I have personally reviewed following labs and imaging studies  CBC: Recent Labs  Lab 05/02/23 1147 05/03/23 1044  WBC 12.3* 14.8*  NEUTROABS 9.7* 12.1*  HGB 13.9 13.3  HCT 42.7 39.8  MCV 89.3 87.9  PLT 375.0 304   Basic Metabolic Panel: Recent Labs  Lab 05/03/23 1044  NA 134*  K 3.8  CL 101  CO2 25  GLUCOSE 130*  BUN 10  CREATININE 0.67   CALCIUM 8.1*   GFR: Estimated Creatinine Clearance: 92.9 mL/min (by C-G formula based on SCr of 0.67 mg/dL). Liver Function Tests: No results for input(s): "AST", "ALT", "ALKPHOS", "BILITOT", "PROT", "ALBUMIN" in the last 168 hours. No results for input(s): "LIPASE", "AMYLASE" in the last 168 hours. No results for input(s): "AMMONIA" in the last 168 hours. Coagulation Profile: No results for input(s): "INR", "PROTIME" in the last 168 hours. Cardiac Enzymes: No results for input(s): "CKTOTAL", "CKMB", "CKMBINDEX", "TROPONINI" in the last 168 hours. BNP (last 3 results) No results for input(s): "PROBNP" in the last 8760 hours. HbA1C: No results for input(s): "HGBA1C" in the last 72 hours. CBG: No results for input(s): "GLUCAP" in the last 168 hours. Lipid Profile: No results for input(s): "CHOL", "HDL", "LDLCALC", "TRIG", "CHOLHDL", "LDLDIRECT" in the last 72 hours. Thyroid Function Tests: No results for input(s): "TSH", "T4TOTAL", "FREET4", "T3FREE", "THYROIDAB" in the last 72 hours. Anemia Panel: No results for input(s): "VITAMINB12", "FOLATE", "FERRITIN", "TIBC", "IRON", "RETICCTPCT" in the last 72 hours. Urine analysis: No results found for: "COLORURINE", "APPEARANCEUR", "LABSPEC", "PHURINE", "GLUCOSEU", "HGBUR", "BILIRUBINUR", "KETONESUR", "PROTEINUR", "UROBILINOGEN", "NITRITE", "LEUKOCYTESUR" Sepsis Labs: @LABRCNTIP (procalcitonin:4,lacticidven:4) )No results found for this or any previous visit (from the past 240 hour(s)).   Radiological Exams on Admission: CT Soft Tissue Neck W Contrast  Result Date: 05/03/2023 CLINICAL DATA:  Epiglottitis or tonsillitis suspected EXAM: CT NECK WITH CONTRAST TECHNIQUE: Multidetector CT imaging of the neck was performed using the standard protocol following the bolus administration of intravenous contrast. RADIATION DOSE REDUCTION: This exam was performed according to the departmental dose-optimization program which includes automated exposure  control, adjustment of the mA and/or kV according to patient size and/or use of iterative reconstruction technique. CONTRAST:  75mL OMNIPAQUE IOHEXOL 300 MG/ML  SOLN COMPARISON:  None Available. FINDINGS: Pharynx and larynx: There is diffuse tonsillar hypertrophy with a 1.4 x 0.9 cm low-density fluid collection in the right palatine tonsils, worrisome for a intra tonsillar abscess. There is soft tissue stranding in the right parapharyngeal space and along the right pharyngeal mucosa to the level of the epiglottis. The right aspect of the epiglottis is asymmetrically thickened, which can be seen in the setting of epiglottitis. Salivary glands: The right submandibular gland is asymmetrically enlarged with mild surrounding fat stranding. No mass,  or stone. Thyroid: Normal thyroid tissue is not visualized at the expected location of the thyroid gland. There is a hyperdense lesion of the base of the tongue (series 3, image 41) measuring 1.7 x 1.7 x 2.3 cm. This could represent a contrast-enhancing mass or ectopic thyroid tissue. Recommend further evaluation with a nuclear medicine thyroid uptake scan. Lymph nodes: None enlarged or abnormal density. Vascular: Negative. Limited intracranial: Negative. Visualized orbits: Negative. Mastoids and visualized paranasal sinuses: Clear. Skeleton: No acute or aggressive process. Upper chest: Negative. Other: There is subcutaneous soft tissue stranding in the Peri and submandibular soft tissues on the right (series 3, image 45) IMPRESSION: 1. Diffuse tonsillar hypertrophy with a 1.4 x 0.9 cm low-density fluid collection in the right palatine tonsils, worrisome for an intra tonsillar abscess. 2. Soft tissue stranding in the right parapharyngeal space and along the right pharyngeal mucosa to the level of the epiglottis, which can be seen in the setting of epiglottitis. 3. Asymmetric enlargement of the right submandibular gland with mild surrounding fat stranding, which can be seen in  the setting of sialoadenitis. 4. Midline hyperdense lesion at the base of the tongue measuring 1.7 x 1.7 x 2.3 cm. This could represent a contrast-enhancing mass or ectopic thyroid tissue. Recommend further evaluation with a nuclear medicine thyroid uptake scan. Electronically Signed   By: Lorenza Cambridge M.D.   On: 05/03/2023 11:59     Assessment/Plan   1. Tonsillar abscess; epiglottitis - Continue Unasyn and supportive care, discus with ENT again if worsening or failing to improve with conservative management    2. COPD  - Not in exacerbation on admission  - Continue ICS-LAMA-LABA and as-needed albuterol    3. Anxiety  - Continue Buspar and as-needed hydroxyzine   4. Hypothyroidism  - Continue Synthroid   5. HLD - Continue Lipitor   6. Tongue lesion  - Non-specific lesion noted at base of tongue on CT in ED, possibly ectopic thyroid  - Outpatient follow-up recommended for nuclear medicine thyroid uptake scan    DVT prophylaxis: SCDs  Code Status: Full  Level of Care: Level of care: Telemetry Medical Family Communication: none present   Disposition Plan:  Patient is from: Home  Anticipated d/c is to: home  Anticipated d/c date is: 05/06/23  Patient currently: Pending improvement with medical management, may need surgical treatment if worsens or fails to improve  Consults called: ENT  Admission status: Inpatient     Briscoe Deutscher, MD [facility]  05/03/2023, 8:27 PM

## 2023-05-03 NOTE — ED Notes (Signed)
ED TO INPATIENT HANDOFF REPORT  S Name/Age/Gender Pamela [name] 62 y.o. female Room/Bed: MH07/MH07  Code Status   Code Status: Not on file  Home/SNF/Other Home Patient oriented to: self, place, time, and situation Is this baseline? Yes   Triage Complete: Triage complete  Chief Complaint Tonsillar abscess [J36]  Triage Note Went to PCP yesterday and was dx with strep. Reports taking 2 doses of Z-pack. Complains of pain in throat and right ear worsening.    Allergies No Known Allergies  Level of Care/Admitting Diagnosis ED Disposition     ED Disposition  Admit   Condition  --   Comment  Hospital Area: [facility] [[id number]]  Level of Care: Telemetry Medical [104]  May admit patient to [facility] or [facility] if equivalent level of care is available:: Yes  Interfacility transfer: Yes  Covid Evaluation: Asymptomatic - no recent exposure (last 10 days) testing not required  Diagnosis: Tonsillar abscess [409811]  Admitting Physician: Clydie Braun [[id number]]  Attending Physician: Clydie Braun [[id number]]  Certification:: I certify this patient will need inpatient services for at least 2 midnights  Estimated Length of Stay: 2          B Medical/Surgery History 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)   History of blood transfusion 2006   HLD (hyperlipidemia)    Hypothyroidism    Obesity    Pneumonia    Sleep apnea    cpap   Thyroid disease    Past Surgical History:  Procedure Laterality Date   AUGMENTATION MAMMAPLASTY Bilateral 1987   Saline   BREAST BIOPSY     INSERTION OF MESH N/A 07/16/2019   Procedure: Insertion Of Mesh;  Surgeon: Abigail Miyamoto, MD;   Location: [facility] OR;  Service: General;  Laterality: N/A;   LAPAROSCOPIC GASTRIC BANDING  2010   VENTRAL HERNIA REPAIR N/A 07/16/2019   Procedure: VENTRAL INCISIONAL HERNIA REPAIR WITH MESH;  Surgeon: Abigail Miyamoto, MD;  Location: [facility] OR;  Service: General;  Laterality: N/A;   XI ROBOTIC ASSISTED VENTRAL HERNIA N/A 03/19/2023   Procedure: OPEN PRIMARY CLOSURE OF RECURRENT INCISIONAL HERNIA AND REMOVAL OF INTRAPERITONEAL MESH;  Surgeon: Quentin Ore, MD;  Location: [facility] ORS;  Service: General;  Laterality: N/A;     A IV Location/Drains/Wounds Patient Lines/Drains/Airways Status     Active Line/Drains/Airways     Name Placement date Placement time Site Days   Peripheral IV 05/03/23 20 G Anterior;Proximal;Right Forearm 05/03/23  1044  Forearm  less than 1   Incision - 4 Ports Abdomen 1: Left 2: Left;Lateral 3: Right 4: Right;Upper 03/19/23  0820  -- 45            Intake/Output Last 24 hours No intake or output data in the 24 hours ending 05/03/23 1410  Labs/Imaging Results for orders placed or performed during the hospital encounter of 05/03/23 (from the past 48 hour(s))  Basic metabolic panel     Status: Abnormal   Collection Time: 05/03/23 10:44 AM  Result Value Ref Range   Sodium 134 (L) 135 - 145 mmol/L   Potassium  3.8 3.5 - 5.1 mmol/L   Chloride 101 98 - 111 mmol/L   CO2 25 22 - 32 mmol/L   Glucose, Bld 130 (H) 70 - 99 mg/dL    Comment: Glucose reference range applies only to samples taken after fasting for at least 8 hours.   BUN 10 8 - 23 mg/dL   Creatinine, Ser 8.29 0.44 - 1.00 mg/dL   Calcium 8.1 (L) 8.9 - 10.3 mg/dL   GFR, Estimated >56 >21 mL/min    Comment: (NOTE) Calculated using the CKD-EPI Creatinine Equation (2021)    Anion gap 8 5 - 15    Comment: Performed at [facility], [address] [place], [place] [address]  CBC with Differential     Status: Abnormal   Collection Time: 05/03/23 10:44 AM  Result Value Ref Range   WBC 14.8 (H) 4.0 -  10.5 K/uL   RBC 4.53 3.87 - 5.11 MIL/uL   Hemoglobin 13.3 12.0 - 15.0 g/dL   HCT 78.4 69.6 - 29.5 %   MCV 87.9 80.0 - 100.0 fL   MCH 29.4 26.0 - 34.0 pg   MCHC 33.4 30.0 - 36.0 g/dL   RDW 28.4 13.2 - 44.0 %   Platelets 304 150 - 400 K/uL   nRBC 0.0 0.0 - 0.2 %   Neutrophils Relative % 81 %   Neutro Abs 12.1 (H) 1.7 - 7.7 K/uL   Lymphocytes Relative 10 %   Lymphs Abs 1.5 0.7 - 4.0 K/uL   Monocytes Relative 7 %   Monocytes Absolute 1.0 0.1 - 1.0 K/uL   Eosinophils Relative 1 %   Eosinophils Absolute 0.1 0.0 - 0.5 K/uL   Basophils Relative 1 %   Basophils Absolute 0.1 0.0 - 0.1 K/uL   Immature Granulocytes 0 %   Abs Immature Granulocytes 0.05 0.00 - 0.07 K/uL    Comment: Performed at [facility], [address] [place], [place] [address]   CT Soft Tissue Neck W Contrast  Result Date: 05/03/2023 CLINICAL DATA:  Epiglottitis or tonsillitis suspected EXAM: CT NECK WITH CONTRAST TECHNIQUE: Multidetector CT imaging of the neck was performed using the standard protocol following the bolus administration of intravenous contrast. RADIATION DOSE REDUCTION: This exam was performed according to the departmental dose-optimization program which includes automated exposure control, adjustment of the mA and/or kV according to patient size and/or use of iterative reconstruction technique. CONTRAST:  75mL OMNIPAQUE IOHEXOL 300 MG/ML  SOLN COMPARISON:  None Available. FINDINGS: Pharynx and larynx: There is diffuse tonsillar hypertrophy with a 1.4 x 0.9 cm low-density fluid collection in the right palatine tonsils, worrisome for a intra tonsillar abscess. There is soft tissue stranding in the right parapharyngeal space and along the right pharyngeal mucosa to the level of the epiglottis. The right aspect of the epiglottis is asymmetrically thickened, which can be seen in the setting of epiglottitis. Salivary glands: The right submandibular gland is asymmetrically enlarged with mild surrounding fat  stranding. No mass, or stone. Thyroid: Normal thyroid tissue is not visualized at the expected location of the thyroid gland. There is a hyperdense lesion of the base of the tongue (series 3, image 41) measuring 1.7 x 1.7 x 2.3 cm. This could represent a contrast-enhancing mass or ectopic thyroid tissue. Recommend further evaluation with a nuclear medicine thyroid uptake scan. Lymph nodes: None enlarged or abnormal density. Vascular: Negative. Limited intracranial: Negative. Visualized orbits: Negative. Mastoids and visualized paranasal sinuses: Clear. Skeleton: No acute or aggressive process.  Upper chest: Negative. Other: There is subcutaneous soft tissue stranding in the Peri and submandibular soft tissues on the right (series 3, image 45) IMPRESSION: 1. Diffuse tonsillar hypertrophy with a 1.4 x 0.9 cm low-density fluid collection in the right palatine tonsils, worrisome for an intra tonsillar abscess. 2. Soft tissue stranding in the right parapharyngeal space and along the right pharyngeal mucosa to the level of the epiglottis, which can be seen in the setting of epiglottitis. 3. Asymmetric enlargement of the right submandibular gland with mild surrounding fat stranding, which can be seen in the setting of sialoadenitis. 4. Midline hyperdense lesion at the base of the tongue measuring 1.7 x 1.7 x 2.3 cm. This could represent a contrast-enhancing mass or ectopic thyroid tissue. Recommend further evaluation with a nuclear medicine thyroid uptake scan. Electronically Signed   By: Lorenza Cambridge M.D.   On: 05/03/2023 11:59    Pending Labs Unresulted Labs (From admission, onward)    None       Vitals/Pain Today's Vitals   05/03/23 1100 05/03/23 1155 05/03/23 1230 05/03/23 1300  BP: 136/74  (!) 142/100 (!) 139/104  Pulse: 86  73 87  Resp: 18   17  Temp:      TempSrc:      SpO2: 92%  95% 94%  Weight:      Height:      PainSc:  8       Isolation Precautions No active  isolations  Medications Medications  dexamethasone (DECADRON) injection 10 mg (10 mg Intravenous Given 05/03/23 1048)  ketorolac (TORADOL) 15 MG/ML injection 15 mg (15 mg Intravenous Given 05/03/23 1048)  iohexol (OMNIPAQUE) 300 MG/ML solution 100 mL (75 mLs Intravenous Contrast Given 05/03/23 1124)  Ampicillin-Sulbactam (UNASYN) 3 g in sodium chloride 0.9 % 100 mL IVPB (0 g Intravenous Stopped 05/03/23 1302)    Mobility Walks independently       R Recommendations: See Admitting Provider Note

## 2023-05-03 NOTE — Telephone Encounter (Signed)
Called pt and left a VM asking pt to please call the office back.

## 2023-05-03 NOTE — Plan of Care (Signed)
Transfer from [facility]  Ms. Leask is a 62 yo female  with pmh COPD and hypothyroidism who presents with complaints of right-sided neck swelling.  She had just recently been seen by her PCP for symptoms and diagnosed which drip started on Z-Pak.  Patient had worsening pain and came to the emergency department for further evaluation.  Labs significant for WBC 14.8.  CT scan of the noted diffuse tonsillar hypertrophy with a 1.4 x 0.9 cm fluid collection in the right palatine fossa concerning for tonsillar abscess and soft tissue stranding of the right parapharyngeal space concerning for epiglottitis.  O2 sat currently maintained on room air and no reports of stridor on noted on physical exam.  Case was discussed with Dr. Jearld Fenton of ENT.  Patient has been given Decadron 10 mg IV and started on Unasyn.  Patient accepted to a medical telemetry bed as inpatient.

## 2023-05-03 NOTE — Telephone Encounter (Signed)
We are covering for strep with the Z-pak; with that severe pain, my concern would be an abscess further down in her throat than I can visualize here; would recommend that she go to the ER.

## 2023-05-03 NOTE — ED Provider Notes (Signed)
[facility] Provider Note   CSN: [id number] Arrival date & time: 05/03/23  0946     History  Chief Complaint  Patient presents with   Sore Throat    Pamela [name] is a 62 y.o. female.  Patient is a 62 year old female with a past medical history of COPD and hypothyroidism presenting to the emergency department with throat pain.  Patient states that she developed right-sided throat and ear pain 2 days ago.  She was seen by her primary doctor's office yesterday who was concerned for strep throat and started her on a Z-Pak and gave her viscous lidocaine for comfort.  She states that her pain is only increased since then.  She states that she has pain with swallowing and has been spitting up her mucus.  She states that she is difficulty opening her mouth due to the pain.  She denies any hearing changes.  She denies any known fevers.  The history is provided by the patient.  Sore Throat       Home Medications Prior to Admission medications   Medication Sig Start Date End Date Taking? Authorizing Provider  acetaminophen (TYLENOL) 500 MG tablet Take 1,000 mg by mouth daily as needed for moderate pain. 04/05/23  Yes [provider]  albuterol (VENTOLIN HFA) 108 (90 Base) MCG/ACT inhaler INHALE 1 TO 2 PUFFS INTO THE LUNGS EVERY 6 HOURS AS NEEDED FOR WHEEZING OR SHORTNESS OF BREATH Patient taking differently: Inhale 1-2 puffs into the lungs every 6 (six) hours as needed for wheezing or shortness of breath. 12/14/22  Yes [name], DO  aspirin EC 81 MG tablet Take 81 mg by mouth daily. Swallow whole.   Yes [provider]  atorvastatin (LIPITOR) 20 MG tablet Take 1 tablet (20 mg total) by mouth daily. Patient taking differently: Take 20 mg by mouth at bedtime. 12/11/22  Yes Wendling, [name], DO  azithromycin (ZITHROMAX Z-PAK) 250 MG tablet Take 2 tablets (500 mg) PO today, then 1 tablet (250 mg) PO daily x4  days. 05/02/23  Yes Olive Bass, FNP  Budeson-Glycopyrrol-Formoterol (BREZTRI AEROSPHERE) 160-9-4.8 MCG/ACT AERO Inhale 2 puffs by mouth twice daily. Rinse mouth out after use. 08/24/22  Yes Wendling, [name], DO  busPIRone (BUSPAR) 5 MG tablet Take 5 mg by mouth 3 (three) times daily. 04/23/23  Yes [provider]  clobetasol (TEMOVATE) 0.05 % external solution Apply 1 Application topically 2 (two) times daily as needed (irritation). 10/29/22  Yes [name], DO  dicyclomine (BENTYL) 10 MG capsule Take 1 tablet by mouth every 6 hours as needed for abdominal cramping. Patient taking differently: Take 10 mg by mouth every 6 (six) hours as needed for spasms (abdominal cramping). 04/17/23  Yes [name], DO  hydrOXYzine (VISTARIL) 25 MG capsule Take 1 capsule by mouth 3 times daily as needed Patient taking differently: Take 25 mg by mouth 3 (three) times daily as needed for anxiety. 01/05/22  Yes   ibuprofen (ADVIL) 200 MG tablet Take 600-800 mg by mouth every 6 (six) hours as needed for moderate pain.   Yes [provider]  levothyroxine (SYNTHROID) 150 MCG tablet Take 1 tablet (150 mcg total) by mouth daily. 12/11/22  Yes [name], DO  lidocaine (XYLOCAINE) 2 % solution Use as directed 15 mLs in the mouth or throat every 6 (six) hours as needed for mouth pain. Gargle and spit 05/02/23  Yes Olive Bass, FNP  Melatonin  10 MG CAPS Take 10 mg by mouth at bedtime.   Yes [provider]  modafinil (PROVIGIL) 200 MG tablet Take 1 tablet by mouth every morning Patient taking differently: Take 200 mg by mouth in the morning. 01/05/22  Yes   Multiple Vitamin (MULTIVITAMIN WITH MINERALS) TABS tablet Take 1 tablet by mouth daily.   Yes [provider]  omeprazole (PRILOSEC) 40 MG capsule TAKE 1 CAPSULE(40 MG) BY MOUTH DAILY AS NEEDED FOR HEARTBURN Patient taking differently: Take 40 mg by mouth daily as needed  (heartburn). 12/11/22  Yes [name], DO      Allergies    Patient has no known allergies.    Review of Systems   Review of Systems  Physical Exam Updated Vital Signs BP (!) 139/104   Pulse 87   Temp 99.3 F (37.4 C) (Oral)   Resp 17   Ht 5\' 4"  (1.626 m)   Wt 119.7 kg   SpO2 94%   BMI 45.32 kg/m  Physical Exam Vitals and nursing note reviewed.  Constitutional:      General: She is not in acute distress.    Appearance: She is well-developed.  HENT:     Head: Normocephalic and atraumatic.     Right Ear: Tympanic membrane and ear canal normal.     Left Ear: Tympanic membrane and ear canal normal.     Mouth/Throat:     Mouth: Mucous membranes are moist.     Pharynx: Uvula swelling present.     Tonsils: Tonsillar abscess (R-sided) present. No tonsillar exudate. 3+ on the right. 0 on the left.     Comments: Opens mouth 2-3 finger breadths  Normal phonation Spitting up saliva into bag Uvula deviation to the L Eyes:     Conjunctiva/sclera: Conjunctivae normal.     Pupils: Pupils are equal, round, and reactive to light.  Cardiovascular:     Rate and Rhythm: Normal rate and regular rhythm.  Pulmonary:     Effort: Pulmonary effort is normal.     Breath sounds: Normal breath sounds. No stridor.  Abdominal:     Palpations: Abdomen is soft.  Musculoskeletal:     Cervical back: Normal range of motion and neck supple.  Lymphadenopathy:     Cervical: Cervical adenopathy (R-sided) present.  Skin:    General: Skin is warm and dry.  Neurological:     General: No focal deficit present.     Mental Status: She is alert and oriented to person, place, and time.  Psychiatric:        Mood and Affect: Mood normal.        Behavior: Behavior normal.     ED Results / Procedures / Treatments   Labs (all labs ordered are listed, but only abnormal results are displayed) Labs Reviewed  BASIC METABOLIC PANEL - Abnormal; Notable for the following components:      Result  Value   Sodium 134 (*)    Glucose, Bld 130 (*)    Calcium 8.1 (*)    All other components within normal limits  CBC WITH DIFFERENTIAL/PLATELET - Abnormal; Notable for the following components:   WBC 14.8 (*)    Neutro Abs 12.1 (*)    All other components within normal limits    EKG None  Radiology CT Soft Tissue Neck W Contrast  Result Date: 05/03/2023 CLINICAL DATA:  Epiglottitis or tonsillitis suspected EXAM: CT NECK WITH CONTRAST TECHNIQUE: Multidetector CT imaging of the neck was performed using the standard  protocol following the bolus administration of intravenous contrast. RADIATION DOSE REDUCTION: This exam was performed according to the departmental dose-optimization program which includes automated exposure control, adjustment of the mA and/or kV according to patient size and/or use of iterative reconstruction technique. CONTRAST:  75mL OMNIPAQUE IOHEXOL 300 MG/ML  SOLN COMPARISON:  None Available. FINDINGS: Pharynx and larynx: There is diffuse tonsillar hypertrophy with a 1.4 x 0.9 cm low-density fluid collection in the right palatine tonsils, worrisome for a intra tonsillar abscess. There is soft tissue stranding in the right parapharyngeal space and along the right pharyngeal mucosa to the level of the epiglottis. The right aspect of the epiglottis is asymmetrically thickened, which can be seen in the setting of epiglottitis. Salivary glands: The right submandibular gland is asymmetrically enlarged with mild surrounding fat stranding. No mass, or stone. Thyroid: Normal thyroid tissue is not visualized at the expected location of the thyroid gland. There is a hyperdense lesion of the base of the tongue (series 3, image 41) measuring 1.7 x 1.7 x 2.3 cm. This could represent a contrast-enhancing mass or ectopic thyroid tissue. Recommend further evaluation with a nuclear medicine thyroid uptake scan. Lymph nodes: None enlarged or abnormal density. Vascular: Negative. Limited intracranial:  Negative. Visualized orbits: Negative. Mastoids and visualized paranasal sinuses: Clear. Skeleton: No acute or aggressive process. Upper chest: Negative. Other: There is subcutaneous soft tissue stranding in the Peri and submandibular soft tissues on the right (series 3, image 45) IMPRESSION: 1. Diffuse tonsillar hypertrophy with a 1.4 x 0.9 cm low-density fluid collection in the right palatine tonsils, worrisome for an intra tonsillar abscess. 2. Soft tissue stranding in the right parapharyngeal space and along the right pharyngeal mucosa to the level of the epiglottis, which can be seen in the setting of epiglottitis. 3. Asymmetric enlargement of the right submandibular gland with mild surrounding fat stranding, which can be seen in the setting of sialoadenitis. 4. Midline hyperdense lesion at the base of the tongue measuring 1.7 x 1.7 x 2.3 cm. This could represent a contrast-enhancing mass or ectopic thyroid tissue. Recommend further evaluation with a nuclear medicine thyroid uptake scan. Electronically Signed   By: Lorenza Cambridge M.D.   On: 05/03/2023 11:59    Procedures Procedures    Medications Ordered in ED Medications  dexamethasone (DECADRON) injection 10 mg (10 mg Intravenous Given 05/03/23 1048)  ketorolac (TORADOL) 15 MG/ML injection 15 mg (15 mg Intravenous Given 05/03/23 1048)  iohexol (OMNIPAQUE) 300 MG/ML solution 100 mL (75 mLs Intravenous Contrast Given 05/03/23 1124)  Ampicillin-Sulbactam (UNASYN) 3 g in sodium chloride 0.9 % 100 mL IVPB (0 g Intravenous Stopped 05/03/23 1302)    ED Course/ Medical Decision Making/ A&P Clinical Course as of 05/03/23 1348  Fri May 03, 2023  1205 CT concerning for peritonsillar abscess and possible epliglottis and submandibular salialadenitis. ENT will be consulted.  [VK]  1306 I spoke with Dr. Jearld Fenton of ENT who recommended medicine admission for IV antibiotics and steroids. No surgical intervention at this time due to the small size of the abscess. [VK]   1347 Patient remains hemodynamically stable. Reports improvement of pain/swelling with steroids and toradol. Hospitalist paged for admission. [VK]    Clinical Course User Index [VK] Rexford Maus, DO                             Medical Decision Making This patient presents to the ED with chief complaint(s) of throat pain with  pertinent past medical history of COPD, hypothyroidism which further complicates the presenting complaint. The complaint involves an extensive differential diagnosis and also carries with it a high risk of complications and morbidity.    The differential diagnosis includes tonsillitis, pharyngitis, tonsillar abscess, uvulitis, RPA, Ludwick's, no evidence of otitis media or externa on exam  Additional history obtained: Additional history obtained from N/A Records reviewed Primary Care Documents  ED Course and Reassessment: On patient's arrival to the emergency department she is hemodynamically stable in no acute distress.  The patient is spitting up in the bag and does have some mild trismus.  She does appear to have right-sided tonsillar swelling with uvular swelling and deviation of the uvula to the left.  Patient does have some swelling to her right side of her neck as well.  The patient will have CT to further evaluate for peritonsillar abscess or other deep space infection.  She will be given Decadron and Toradol for symptomatic management and will be closely reassessed.  Independent labs interpretation:  The following labs were independently interpreted: leukocytosis, otherwise within normal range  Independent visualization of imaging: - I independently visualized the following imaging with scope of interpretation limited to determining acute life threatening conditions related to emergency care: CT neck, which revealed tonsillar abscess with tonsillar hypertrophy, possible epiglottitis, sialoadenitis   Consultation: - Consulted or discussed  management/test interpretation w/ external professional: ENT, hospitalist  Consideration for admission or further workup: patient requires admission for IV antibiotics and airway monitoring in the setting of her infections Social Determinants of health: N/A    Amount and/or Complexity of Data Reviewed Labs: ordered. Radiology: ordered.  Risk Prescription drug management. Decision regarding hospitalization.          Final Clinical Impression(s) / ED Diagnoses Final diagnoses:  Tonsillar abscess  Epiglottitis  Sialadenitis    Rx / DC Orders ED Discharge Orders     None         Rexford Maus, DO 05/03/23 1348

## 2023-05-03 NOTE — ED Notes (Signed)
Patient transported to CT 

## 2023-05-04 DIAGNOSIS — J0510 Acute epiglottitis without obstruction: Secondary | ICD-10-CM

## 2023-05-04 DIAGNOSIS — E039 Hypothyroidism, unspecified: Secondary | ICD-10-CM | POA: Diagnosis not present

## 2023-05-04 DIAGNOSIS — J449 Chronic obstructive pulmonary disease, unspecified: Secondary | ICD-10-CM | POA: Diagnosis not present

## 2023-05-04 DIAGNOSIS — K148 Other diseases of tongue: Secondary | ICD-10-CM

## 2023-05-04 DIAGNOSIS — J36 Peritonsillar abscess: Secondary | ICD-10-CM | POA: Diagnosis not present

## 2023-05-04 LAB — CBC
HCT: 40.3 % (ref 36.0–46.0)
Hemoglobin: 13.2 g/dL (ref 12.0–15.0)
MCH: 28.5 pg (ref 26.0–34.0)
MCHC: 32.8 g/dL (ref 30.0–36.0)
MCV: 87 fL (ref 80.0–100.0)
Platelets: 315 10*3/uL (ref 150–400)
RBC: 4.63 MIL/uL (ref 3.87–5.11)
RDW: 15 % (ref 11.5–15.5)
WBC: 12.7 10*3/uL — ABNORMAL HIGH (ref 4.0–10.5)
nRBC: 0 % (ref 0.0–0.2)

## 2023-05-04 LAB — BASIC METABOLIC PANEL
Anion gap: 12 (ref 5–15)
BUN: 10 mg/dL (ref 8–23)
CO2: 25 mmol/L (ref 22–32)
Calcium: 8.8 mg/dL — ABNORMAL LOW (ref 8.9–10.3)
Chloride: 99 mmol/L (ref 98–111)
Creatinine, Ser: 0.65 mg/dL (ref 0.44–1.00)
GFR, Estimated: >60 mL/min (ref >60)
Glucose, Bld: 123 mg/dL — ABNORMAL HIGH (ref 70–99)
Potassium: 3.7 mmol/L (ref 3.5–5.1)
Sodium: 136 mmol/L (ref 135–145)

## 2023-05-04 LAB — HIV ANTIBODY (ROUTINE TESTING W REFLEX): HIV Screen 4th Generation wRfx: NONREACTIVE

## 2023-05-04 MED ORDER — HYDROMORPHONE HCL 1 MG/ML IJ SOLN: 0.5000 mg | INTRAMUSCULAR | Status: DC | PRN

## 2023-05-04 MED ORDER — OXYCODONE HCL 5 MG/5ML PO SOLN
5.0000 mg | Freq: Four times a day (QID) | ORAL | Status: DC | PRN
  Administered 2023-05-04 – 2023-05-05 (×4): 5 mg via ORAL
  Filled 2023-05-04 (×4): qty 5

## 2023-05-04 MED ORDER — KETOROLAC TROMETHAMINE 15 MG/ML IJ SOLN
30.0000 mg | Freq: Three times a day (TID) | INTRAMUSCULAR | Status: DC
  Administered 2023-05-04 – 2023-05-05 (×5): 30 mg via INTRAVENOUS
  Filled 2023-05-04 (×5): qty 2

## 2023-05-04 MED ORDER — FENTANYL CITRATE PF 50 MCG/ML IJ SOSY
25.0000 ug | PREFILLED_SYRINGE | Freq: Once | INTRAMUSCULAR | Status: AC
  Administered 2023-05-04: 25 ug via INTRAVENOUS
  Filled 2023-05-04: qty 1

## 2023-05-04 MED ORDER — UMECLIDINIUM BROMIDE 62.5 MCG/ACT IN AEPB
1.0000 | INHALATION_SPRAY | Freq: Every day | RESPIRATORY_TRACT | Status: DC
  Administered 2023-05-04 – 2023-05-05 (×2): 1 via RESPIRATORY_TRACT
  Filled 2023-05-04: qty 7

## 2023-05-04 NOTE — Progress Notes (Signed)
   05/04/23 2311  BiPAP/CPAP/SIPAP  BiPAP/CPAP/SIPAP Pt Type Adult  BiPAP/CPAP/SIPAP DREAMSTATIOND  Mask Type Full face mask  Respiratory Rate 18 breaths/min  EPAP 8 cmH2O  Flow Rate 2 lpm  Patient Home Equipment No  Auto Titrate No  BiPAP/CPAP /SiPAP Vitals  Pulse Rate 69  Resp 16  SpO2 92 %  Bilateral Breath Sounds Clear;Diminished  MEWS Score/Color  MEWS Score 0  MEWS Score Color Green   3L bleed in

## 2023-05-04 NOTE — Progress Notes (Signed)
PROGRESS NOTE  Pamela [name] BJY:[id number] DOB: [birth year]   PCP: [name], DO  Patient is from: Home.  DOA: 05/03/2023 LOS: 1  Chief complaints Chief Complaint  Patient presents with   Sore Throat     Brief Narrative / Interim history: 62 year old F with PMH of COPD, OSA on CPAP, morbid obesity, anxiety, depression, HLD and hypothyroidism presenting with progressive right neck pain and swelling with odynophagia and voice change 4 days and admitted for right tonsillar abscess and epiglottitis.  Hemodynamically stable.  Leukocytosis to 14.8.  CT soft tissue neck showed 1.4 x 0.8 cm low-density fluid collection in the right palatine tonsil worrisome for intratonsillar abscess, soft tissue stranding in the right parapharyngeal space concerning for epiglottitis and possible right submandibular sialoadenitis and nonspecific tongue base lesion measuring 1.7 x 1.7 x 2.3 cm.  The deep discussed with ENT and recommended conservative management with IV antibiotics send the abscess is small.  Patient was started on Unasyn and admitted.  Received Decadron and Toradol in ED.  The next day, worsening pain.  ENT formally consulted    Subjective: Seen and examined earlier this morning.  Reports worsening pain.  Frustrated with pain management.  Also reports difficulty swallowing.  She denies difficulty breathing.  Objective: Vitals:   05/03/23 1903 05/03/23 2322 05/04/23 0531 05/04/23 0816  BP: (!) 137/108 (!) 156/95 (!) 151/95 (!) 173/97  Pulse: 90 77 71 68  Resp: 16 19 18    Temp: 98.6 F (37 C) 98.3 F (36.8 C)  98.1 F (36.7 C)  TempSrc: Oral Oral  Oral  SpO2: 94% 94% 95% 97%  Weight:      Height:        Examination:  GENERAL: No apparent distress.  Nontoxic. HEENT: Significant trismus. NECK: Limited ROM.  Tender right neck and right submandibular region.  RESP:  No IWOB.  Fair aeration bilaterally. CVS:  RRR. Heart sounds normal.  ABD/GI/GU: BS+. Abd soft, NTND.   MSK/EXT:  Moves extremities. No apparent deformity. No edema.  SKIN: no apparent skin lesion or wound NEURO: Awake, alert and oriented appropriately.  No apparent focal neuro deficit. PSYCH: Calm. Normal affect.   Procedures:  None  Microbiology summarized: None  Assessment and plan: Principal Problem:   Tonsillar abscess Active Problems:   Hypothyroidism   COPD (chronic obstructive pulmonary disease) (HCC)   Anxiety   Tongue lesion  Right palatine tonsillar abscess/epiglottitis/sialoadenitis: Presents with progressive right neck pain, swelling, odynophagia and voice change.  Treated with Zithromax and lidocaine outpatient without significant improvement.  Significant trismus with right neck and submandibular swelling and tenderness on exam.  Patient reports worsening pain despite IV antibiotics.  CT findings as above.  No respiratory compromise.  Leukocytosis improving.  Hemodynamically stable. -Continue IV Unasyn -Consulted ENT, Dr. Jearld Fenton formally -Increase IV Dilaudid to 0.5 mg every 3 hours as needed severe pain -Change oxycodone to liquid form -Add Toradol 30 mg every 8 hours  Elevated blood pressure: No history of hypertension.  Could be due to pain. -Pain management as above -IV labetalol as needed   Chronic COPD: Stable. -Continue inhalers  OSA on CPAP -Continue CPAP   Anxiety and depression: Stable -Continue home meds   Hypothyroidism  -Continue Synthroid    Hyperlipidemia -Continue Lipitor    Nonspecific tongue lesion: Noted on CT -ENT evaluation  Morbid obesity Body mass index is 45.32 kg/m. -Encourage lifestyle change to lose weight         DVT prophylaxis:  SCDs Start:  05/03/23 2026  Code Status: Full code Family Communication: None at bedside Level of care: Telemetry Medical Status is: Inpatient Remains inpatient appropriate because: Tonsillar abscess/neck infection   Final disposition: Home Consultants:  ENT  55 minutes with more  than 50% spent in reviewing records, counseling patient/family and coordinating care.   Sch Meds:  Scheduled Meds:  atorvastatin  20 mg Oral QHS   busPIRone  5 mg Oral TID   ketorolac  30 mg Intravenous Q8H   levothyroxine  150 mcg Oral Q0600   melatonin  10 mg Oral QHS   mometasone-formoterol  2 puff Inhalation BID   pantoprazole  40 mg Oral Daily   sodium chloride flush  3 mL Intravenous Q12H   umeclidinium bromide  1 puff Inhalation Daily   Continuous Infusions:  ampicillin-sulbactam (UNASYN) IV 3 g (05/04/23 0340)   PRN Meds:.acetaminophen **OR** acetaminophen, albuterol, dicyclomine, HYDROmorphone (DILAUDID) injection, hydrOXYzine, ondansetron **OR** ondansetron (ZOFRAN) IV, oxyCODONE  Antimicrobials: Anti-infectives (From admission, onward)    Start     Dose/Rate Route Frequency Ordered Stop   05/03/23 2115  Ampicillin-Sulbactam (UNASYN) 3 g in sodium chloride 0.9 % 100 mL IVPB        3 g 200 mL/hr over 30 Minutes Intravenous Every 6 hours 05/03/23 2029     05/03/23 1215  Ampicillin-Sulbactam (UNASYN) 3 g in sodium chloride 0.9 % 100 mL IVPB        3 g 200 mL/hr over 30 Minutes Intravenous  Once 05/03/23 1207 05/03/23 1302        I have personally reviewed the following labs and images: CBC: Recent Labs  Lab 05/02/23 1147 05/03/23 1044 05/04/23 0607  WBC 12.3* 14.8* 12.7*  NEUTROABS 9.7* 12.1*  --   HGB 13.9 13.3 13.2  HCT 42.7 39.8 40.3  MCV 89.3 87.9 87.0  PLT 375.0 304 315   BMP &GFR Recent Labs  Lab 05/03/23 1044 05/04/23 0607  NA 134* 136  K 3.8 3.7  CL 101 99  CO2 25 25  GLUCOSE 130* 123*  BUN 10 10  CREATININE 0.67 0.65  CALCIUM 8.1* 8.8*   Estimated Creatinine Clearance: 92.9 mL/min (by C-G formula based on SCr of 0.65 mg/dL). Liver & Pancreas: No results for input(s): "AST", "ALT", "ALKPHOS", "BILITOT", "PROT", "ALBUMIN" in the last 168 hours. No results for input(s): "LIPASE", "AMYLASE" in the last 168 hours. No results for input(s):  "AMMONIA" in the last 168 hours. Diabetic: No results for input(s): "HGBA1C" in the last 72 hours. No results for input(s): "GLUCAP" in the last 168 hours. Cardiac Enzymes: No results for input(s): "CKTOTAL", "CKMB", "CKMBINDEX", "TROPONINI" in the last 168 hours. No results for input(s): "PROBNP" in the last 8760 hours. Coagulation Profile: No results for input(s): "INR", "PROTIME" in the last 168 hours. Thyroid Function Tests: No results for input(s): "TSH", "T4TOTAL", "FREET4", "T3FREE", "THYROIDAB" in the last 72 hours. Lipid Profile: No results for input(s): "CHOL", "HDL", "LDLCALC", "TRIG", "CHOLHDL", "LDLDIRECT" in the last 72 hours. Anemia Panel: No results for input(s): "VITAMINB12", "FOLATE", "FERRITIN", "TIBC", "IRON", "RETICCTPCT" in the last 72 hours. Urine analysis: No results found for: "COLORURINE", "APPEARANCEUR", "LABSPEC", "PHURINE", "GLUCOSEU", "HGBUR", "BILIRUBINUR", "KETONESUR", "PROTEINUR", "UROBILINOGEN", "NITRITE", "LEUKOCYTESUR" Sepsis Labs: Invalid input(s): "PROCALCITONIN", "LACTICIDVEN"  Microbiology: No results found for this or any previous visit (from the past 240 hour(s)).  Radiology Studies: CT Soft Tissue Neck W Contrast  Result Date: 05/03/2023 CLINICAL DATA:  Epiglottitis or tonsillitis suspected EXAM: CT NECK WITH CONTRAST TECHNIQUE: Multidetector CT imaging of the neck  was performed using the standard protocol following the bolus administration of intravenous contrast. RADIATION DOSE REDUCTION: This exam was performed according to the departmental dose-optimization program which includes automated exposure control, adjustment of the mA and/or kV according to patient size and/or use of iterative reconstruction technique. CONTRAST:  75mL OMNIPAQUE IOHEXOL 300 MG/ML  SOLN COMPARISON:  None Available. FINDINGS: Pharynx and larynx: There is diffuse tonsillar hypertrophy with a 1.4 x 0.9 cm low-density fluid collection in the right palatine tonsils, worrisome  for a intra tonsillar abscess. There is soft tissue stranding in the right parapharyngeal space and along the right pharyngeal mucosa to the level of the epiglottis. The right aspect of the epiglottis is asymmetrically thickened, which can be seen in the setting of epiglottitis. Salivary glands: The right submandibular gland is asymmetrically enlarged with mild surrounding fat stranding. No mass, or stone. Thyroid: Normal thyroid tissue is not visualized at the expected location of the thyroid gland. There is a hyperdense lesion of the base of the tongue (series 3, image 41) measuring 1.7 x 1.7 x 2.3 cm. This could represent a contrast-enhancing mass or ectopic thyroid tissue. Recommend further evaluation with a nuclear medicine thyroid uptake scan. Lymph nodes: None enlarged or abnormal density. Vascular: Negative. Limited intracranial: Negative. Visualized orbits: Negative. Mastoids and visualized paranasal sinuses: Clear. Skeleton: No acute or aggressive process. Upper chest: Negative. Other: There is subcutaneous soft tissue stranding in the Peri and submandibular soft tissues on the right (series 3, image 45) IMPRESSION: 1. Diffuse tonsillar hypertrophy with a 1.4 x 0.9 cm low-density fluid collection in the right palatine tonsils, worrisome for an intra tonsillar abscess. 2. Soft tissue stranding in the right parapharyngeal space and along the right pharyngeal mucosa to the level of the epiglottis, which can be seen in the setting of epiglottitis. 3. Asymmetric enlargement of the right submandibular gland with mild surrounding fat stranding, which can be seen in the setting of sialoadenitis. 4. Midline hyperdense lesion at the base of the tongue measuring 1.7 x 1.7 x 2.3 cm. This could represent a contrast-enhancing mass or ectopic thyroid tissue. Recommend further evaluation with a nuclear medicine thyroid uptake scan. Electronically Signed   By: Lorenza Cambridge M.D.   On: 05/03/2023 11:59      [facility] [place] T.  Esa Raden [facility]  If 7PM-7AM, please contact night-coverage [email or web address] 05/04/2023, 9:25 AM

## 2023-05-04 NOTE — Progress Notes (Signed)
Patient placed on CPAP via a FFM won 8cmH2O  pressure.  Patient is tolerating at this time.

## 2023-05-04 NOTE — Consult Note (Signed)
Reason for Consult: Tonsil abscess Referring Physician: Dr.Gonfa  Pamela [name] is an 62 y.o. female.  HPI: History of right pain in the throat since Thursday.  It came up suddenly.  She denies any dental problems.  She was treated with some Zithromax.  She went to the emergency room at [facility] and was transferred here for medical therapy.  She has not had this problem previously.  She does have pain in the right tonsil region.  No significant trismus.  CT scan shows a right peritonsillar abscess it is somewhat inferior.  It is about 1.4 cm.  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)   History of blood transfusion 2006   HLD (hyperlipidemia)    Hypothyroidism    Obesity    Pneumonia    Sleep apnea    cpap   Thyroid disease     Past Surgical History:  Procedure Laterality Date   AUGMENTATION MAMMAPLASTY Bilateral 1987   Saline   BREAST BIOPSY     INSERTION OF MESH N/A 07/16/2019   Procedure: Insertion Of Mesh;  Surgeon: Abigail Miyamoto, MD;  Location: [facility] OR;  Service: General;  Laterality: N/A;   LAPAROSCOPIC GASTRIC BANDING  2010   VENTRAL HERNIA REPAIR N/A 07/16/2019   Procedure: VENTRAL INCISIONAL HERNIA REPAIR WITH MESH;  Surgeon: Abigail Miyamoto, MD;  Location: [facility] OR;  Service: General;  Laterality: N/A;   XI ROBOTIC ASSISTED VENTRAL HERNIA N/A 03/19/2023   Procedure: OPEN PRIMARY CLOSURE OF RECURRENT INCISIONAL HERNIA AND REMOVAL OF INTRAPERITONEAL MESH;  Surgeon: Quentin Ore, MD;  Location: [facility] ORS;  Service: General;  Laterality: N/A;    Family History  Problem Relation Age of Onset   Alcohol abuse Mother    Depression Mother    Early death Mother    Liver disease Mother    Alcohol  abuse Father    Obesity Father    Cancer Father     Social History:  reports that she has been smoking cigarettes. She has a 7.50 pack-year smoking history. She has never used smokeless tobacco. She reports that she does not drink alcohol and does not use drugs.  Allergies: No Known Allergies  Medications: I have reviewed the patient's current medications.  Results for orders placed or performed during the hospital encounter of 05/03/23 (from the past 48 hour(s))  Basic metabolic panel     Status: Abnormal   Collection Time: 05/03/23 10:44 AM  Result Value Ref Range   Sodium 134 (L) 135 - 145 mmol/L   Potassium 3.8 3.5 - 5.1 mmol/L   Chloride 101 98 - 111 mmol/L   CO2 25 22 - 32 mmol/L   Glucose, Bld 130 (H) 70 - 99 mg/dL    Comment: Glucose reference range applies only to samples taken after fasting for at least 8 hours.   BUN 10 8 - 23 mg/dL   Creatinine, Ser 1.61 0.44 - 1.00 mg/dL   Calcium 8.1 (L) 8.9 - 10.3 mg/dL   GFR, Estimated >09 >60 mL/min    Comment: (NOTE) Calculated using the CKD-EPI Creatinine Equation (2021)    Anion  gap 8 5 - 15    Comment: Performed at [facility], [address] [place], [place] [address]  CBC with Differential     Status: Abnormal   Collection Time: 05/03/23 10:44 AM  Result Value Ref Range   WBC 14.8 (H) 4.0 - 10.5 K/uL   RBC 4.53 3.87 - 5.11 MIL/uL   Hemoglobin 13.3 12.0 - 15.0 g/dL   HCT 95.2 84.1 - 32.4 %   MCV 87.9 80.0 - 100.0 fL   MCH 29.4 26.0 - 34.0 pg   MCHC 33.4 30.0 - 36.0 g/dL   RDW 40.1 02.7 - 25.3 %   Platelets 304 150 - 400 K/uL   nRBC 0.0 0.0 - 0.2 %   Neutrophils Relative % 81 %   Neutro Abs 12.1 (H) 1.7 - 7.7 K/uL   Lymphocytes Relative 10 %   Lymphs Abs 1.5 0.7 - 4.0 K/uL   Monocytes Relative 7 %   Monocytes Absolute 1.0 0.1 - 1.0 K/uL   Eosinophils Relative 1 %   Eosinophils Absolute 0.1 0.0 - 0.5 K/uL   Basophils Relative 1 %   Basophils Absolute 0.1 0.0 - 0.1 K/uL   Immature Granulocytes 0 %    Abs Immature Granulocytes 0.05 0.00 - 0.07 K/uL    Comment: Performed at [facility], [address] [place], [place] [address]  Basic metabolic panel     Status: Abnormal   Collection Time: 05/04/23  6:07 AM  Result Value Ref Range   Sodium 136 135 - 145 mmol/L   Potassium 3.7 3.5 - 5.1 mmol/L   Chloride 99 98 - 111 mmol/L   CO2 25 22 - 32 mmol/L   Glucose, Bld 123 (H) 70 - 99 mg/dL    Comment: Glucose reference range applies only to samples taken after fasting for at least 8 hours.   BUN 10 8 - 23 mg/dL   Creatinine, Ser 3.47 0.44 - 1.00 mg/dL   Calcium 8.8 (L) 8.9 - 10.3 mg/dL   GFR, Estimated >42 >59 mL/min    Comment: (NOTE) Calculated using the CKD-EPI Creatinine Equation (2021)    Anion gap 12 5 - 15    Comment: Performed at [facility], [address], [place], [place] [address]  CBC     Status: Abnormal   Collection Time: 05/04/23  6:07 AM  Result Value Ref Range   WBC 12.7 (H) 4.0 - 10.5 K/uL   RBC 4.63 3.87 - 5.11 MIL/uL   Hemoglobin 13.2 12.0 - 15.0 g/dL   HCT 56.4 33.2 - 95.1 %   MCV 87.0 80.0 - 100.0 fL   MCH 28.5 26.0 - 34.0 pg   MCHC 32.8 30.0 - 36.0 g/dL   RDW 88.4 16.6 - 06.3 %   Platelets 315 150 - 400 K/uL   nRBC 0.0 0.0 - 0.2 %    Comment: Performed at [facility], [address] [place], [place] [address]    CT Soft Tissue Neck W Contrast  Result Date: 05/03/2023 CLINICAL DATA:  Epiglottitis or tonsillitis suspected EXAM: CT NECK WITH CONTRAST TECHNIQUE: Multidetector CT imaging of the neck was performed using the standard protocol following the bolus administration of intravenous contrast. RADIATION DOSE REDUCTION: This exam was performed according to the departmental dose-optimization program which includes automated exposure control, adjustment of the mA and/or kV according to patient size and/or use of iterative reconstruction technique. CONTRAST:  75mL OMNIPAQUE IOHEXOL 300 MG/ML  SOLN  COMPARISON:  None Available.  FINDINGS: Pharynx and larynx: There is diffuse tonsillar hypertrophy with a 1.4 x 0.9 cm low-density fluid collection in the right palatine tonsils, worrisome for a intra tonsillar abscess. There is soft tissue stranding in the right parapharyngeal space and along the right pharyngeal mucosa to the level of the epiglottis. The right aspect of the epiglottis is asymmetrically thickened, which can be seen in the setting of epiglottitis. Salivary glands: The right submandibular gland is asymmetrically enlarged with mild surrounding fat stranding. No mass, or stone. Thyroid: Normal thyroid tissue is not visualized at the expected location of the thyroid gland. There is a hyperdense lesion of the base of the tongue (series 3, image 41) measuring 1.7 x 1.7 x 2.3 cm. This could represent a contrast-enhancing mass or ectopic thyroid tissue. Recommend further evaluation with a nuclear medicine thyroid uptake scan. Lymph nodes: None enlarged or abnormal density. Vascular: Negative. Limited intracranial: Negative. Visualized orbits: Negative. Mastoids and visualized paranasal sinuses: Clear. Skeleton: No acute or aggressive process. Upper chest: Negative. Other: There is subcutaneous soft tissue stranding in the Peri and submandibular soft tissues on the right (series 3, image 45) IMPRESSION: 1. Diffuse tonsillar hypertrophy with a 1.4 x 0.9 cm low-density fluid collection in the right palatine tonsils, worrisome for an intra tonsillar abscess. 2. Soft tissue stranding in the right parapharyngeal space and along the right pharyngeal mucosa to the level of the epiglottis, which can be seen in the setting of epiglottitis. 3. Asymmetric enlargement of the right submandibular gland with mild surrounding fat stranding, which can be seen in the setting of sialoadenitis. 4. Midline hyperdense lesion at the base of the tongue measuring 1.7 x 1.7 x 2.3 cm. This could represent a contrast-enhancing mass or ectopic thyroid tissue.  Recommend further evaluation with a nuclear medicine thyroid uptake scan. Electronically Signed   By: Lorenza Cambridge M.D.   On: 05/03/2023 11:59    ROS Blood pressure (!) 173/97, pulse 68, temperature 98.1 F (36.7 C), temperature source Oral, resp. rate 18, height 5\' 4"  (1.626 m), weight 119.7 kg, SpO2 97 %. Physical Exam HENT:     Head: Normocephalic.     Right Ear: Ear canal normal.     Left Ear: Ear canal normal.     Mouth/Throat:     Comments: She opens her mouth well.  The right tonsil is bulging medially slightly.  The uvula is not significantly hypertrophied or edematous.  The tongue looks normal.  There does not appear to be any dental inflammation or infection Eyes:     Conjunctiva/sclera: Conjunctivae normal.     Pupils: Pupils are equal, round, and reactive to light.  Neck:     Comments: Neck is soft.  She is tender in the jugular digastric region.  There is no firmness or induration Neurological:     Mental Status: She is alert.       Assessment/Plan: Right peritonsillar abscess-it is small and she today is actually doing significantly better albeit that may be from finally getting her pain medication correct.  We discussed the options of incision and drainage versus continued medical therapy.  She elects continued medical therapy.  I will check her again tomorrow and see how we have progressed.  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  This will need further workup as an outpatient.  [name] 05/04/2023, 9:54 AM

## 2023-05-05 DIAGNOSIS — K148 Other diseases of tongue: Secondary | ICD-10-CM | POA: Diagnosis not present

## 2023-05-05 DIAGNOSIS — J449 Chronic obstructive pulmonary disease, unspecified: Secondary | ICD-10-CM | POA: Diagnosis not present

## 2023-05-05 DIAGNOSIS — J36 Peritonsillar abscess: Secondary | ICD-10-CM | POA: Diagnosis not present

## 2023-05-05 DIAGNOSIS — F419 Anxiety disorder, unspecified: Secondary | ICD-10-CM | POA: Diagnosis not present

## 2023-05-05 LAB — RENAL FUNCTION PANEL
Albumin: 2.5 g/dL — ABNORMAL LOW (ref 3.5–5.0)
Anion gap: 9 (ref 5–15)
BUN: 11 mg/dL (ref 8–23)
CO2: 24 mmol/L (ref 22–32)
Calcium: 8 mg/dL — ABNORMAL LOW (ref 8.9–10.3)
Chloride: 105 mmol/L (ref 98–111)
Creatinine, Ser: 0.65 mg/dL (ref 0.44–1.00)
GFR, Estimated: >60 mL/min (ref >60)
Glucose, Bld: 114 mg/dL — ABNORMAL HIGH (ref 70–99)
Phosphorus: 5.1 mg/dL — ABNORMAL HIGH (ref 2.5–4.6)
Potassium: 3 mmol/L — ABNORMAL LOW (ref 3.5–5.1)
Sodium: 138 mmol/L (ref 135–145)

## 2023-05-05 LAB — CBC
HCT: 37.1 % (ref 36.0–46.0)
Hemoglobin: 12.2 g/dL (ref 12.0–15.0)
MCH: 29.5 pg (ref 26.0–34.0)
MCHC: 32.9 g/dL (ref 30.0–36.0)
MCV: 89.6 fL (ref 80.0–100.0)
Platelets: 276 10*3/uL (ref 150–400)
RBC: 4.14 MIL/uL (ref 3.87–5.11)
RDW: 15 % (ref 11.5–15.5)
WBC: 9.9 10*3/uL (ref 4.0–10.5)
nRBC: 0 % (ref 0.0–0.2)

## 2023-05-05 LAB — MAGNESIUM: Magnesium: 1.7 mg/dL (ref 1.7–2.4)

## 2023-05-05 MED ORDER — MAGIC MOUTHWASH W/LIDOCAINE
5.0000 mL | Freq: Four times a day (QID) | ORAL | Status: DC
  Administered 2023-05-05: 5 mL via ORAL
  Filled 2023-05-05 (×4): qty 5

## 2023-05-05 MED ORDER — CHLORHEXIDINE GLUCONATE 0.12 % MT SOLN
15.0000 mL | Freq: Two times a day (BID) | OROMUCOSAL | Status: DC
  Administered 2023-05-05: 15 mL via OROMUCOSAL
  Filled 2023-05-05: qty 15

## 2023-05-05 MED ORDER — AMOXICILLIN-POT CLAVULANATE 875-125 MG PO TABS: 1.0000 | ORAL_TABLET | Freq: Two times a day (BID) | ORAL | 0 refills | Status: AC

## 2023-05-05 MED ORDER — POTASSIUM CHLORIDE CRYS ER 20 MEQ PO TBCR: 20.0000 meq | EXTENDED_RELEASE_TABLET | Freq: Two times a day (BID) | ORAL | 0 refills | Status: DC

## 2023-05-05 MED ORDER — CHLORHEXIDINE GLUCONATE 0.12 % MT SOLN: 15.0000 mL | Freq: Two times a day (BID) | OROMUCOSAL | 0 refills | Status: DC

## 2023-05-05 MED ORDER — MAGNESIUM SULFATE 2 GM/50ML IV SOLN
2.0000 g | Freq: Once | INTRAVENOUS | Status: AC
  Administered 2023-05-05: 2 g via INTRAVENOUS
  Filled 2023-05-05: qty 50

## 2023-05-05 MED ORDER — POTASSIUM CHLORIDE 20 MEQ PO PACK
40.0000 meq | PACK | ORAL | Status: DC
  Administered 2023-05-05: 40 meq via ORAL
  Filled 2023-05-05: qty 2

## 2023-05-05 NOTE — Discharge Summary (Signed)
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 Check CMP and CBC at follow-up Please follow up on the following pending results: None  Home Health: Not indicated Equipment/Devices: Not indicated  Discharge Condition: Stable CODE STATUS: Full code  Follow-up Information     Pamela Dory, DO. Schedule an appointment as soon as possible for a visit in 1 week(s).   Specialty: Family Medicine Contact information: [address] [place] [address] [phone]         Pamela Obey, MD. Schedule an appointment as soon as possible for a visit in 3 day(s).   Specialty: Otolaryngology Why: If symptoms worsen, As needed Contact information: [address] [place] [address] [phone]                 Hospital course 62 year old F with PMH of COPD, OSA on CPAP, morbid obesity, anxiety, depression, HLD and hypothyroidism presenting with progressive right neck pain and swelling with odynophagia and voice change 4 days and admitted for right tonsillar abscess and epiglottitis.  Hemodynamically stable.  Leukocytosis to 14.8.  CT soft tissue neck showed 1.4 x 0.8 cm low-density fluid collection in the right palatine tonsil worrisome for intratonsillar abscess, soft tissue stranding in the right parapharyngeal space concerning for epiglottitis and possible right submandibular sialoadenitis and nonspecific tongue base lesion measuring 1.7 x 1.7 x 2.3 cm.  ED provider discussed CT finding with ENT.  The later recommended conservative management with IV antibiotics send the abscess is small.   Patient was started on Unasyn and admitted.  Received Decadron and Toradol in ED.   The next day, formally evaluated by ENT, Dr. Jearld Fenton.  Pain improved after resuming Toradol. On the day of discharge, patient felt much better.  Able to open her mouth and a soft diet.  Full range of motion.  Felt well and ready to go home.  Cleared for discharge by ENT on p.o. Augmentin.  She is discharged on Augmentin for 5 more days to complete treatment course.  Also prescribed chlorhexidine mouth rinse.  Can use Aleve or naproxen as needed for the next 3 to 4 days.  See individual problem list below for more.   Problems addressed during this hospitalization Principal Problem:   Tonsillar abscess Active Problems:   Hypothyroidism   COPD (chronic obstructive pulmonary disease) (HCC)   Anxiety   Tongue lesion   Right palatine tonsillar abscess/epiglottitis/sialoadenitis: Presents with progressive right neck pain, swelling, odynophagia and voice change.  Treated with Zithromax and lidocaine outpatient without significant improvement.  CT finding as above.  Improved with IV Unasyn and Toradol. -Cleared for discharge by ENT.   -Discharged on p.o. Augmentin for 5 more days to complete course. -Aleve or ibuprofen as needed for pain -Outpatient follow-up with ENT as needed -Chlorhexidine mouthwash   Elevated blood pressure: Likely due to pain.  Improved.   Chronic COPD: Stable. -Continue inhalers   OSA on CPAP -Continue CPAP   Anxiety and depression: Stable -Continue home meds   Hypothyroidism  -Continue Synthroid    Hyperlipidemia -Continue Lipitor    Nonspecific tongue lesion: Noted on CT. 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 -Outpatient  follow-up.  May need nuclear medicine study  Anxiety: Appears anxious -Continue home meds  Hypokalemia: K3.0.  Received p.o. KCl 40 prior to discharge -Discharge on p.o. KCl 20 mEq twice daily for 2 days -Reassess at follow-up   Morbid obesity Body mass  index is 45.32 kg/m.            Time spent 35 minutes  Vital signs Vitals:   05/04/23 2311 05/05/23 0531 05/05/23 0825 05/05/23 0840  BP:  (!) 140/82 (!) 142/92   Pulse: 69 (!) 58 63   Temp:      Resp: 16 19 18    Height:      Weight:      SpO2: 92% 97% 96% 95%  TempSrc:      BMI (Calculated):         Discharge exam  GENERAL: No apparent distress.  Nontoxic. HEENT: Vision and hearing grossly intact. Subtle right submandibular swelling and tenderness.  No trismus. NECK: Full range of motion in neck. RESP:  No IWOB.  Fair aeration bilaterally. CVS:  RRR. Heart sounds normal.  ABD/GI/GU: BS+. Abd soft, NTND.  MSK/EXT:  Moves extremities. No apparent deformity. No edema.  SKIN: no apparent skin lesion or wound NEURO: Awake and alert. Oriented appropriately.  No apparent focal neuro deficit. PSYCH: Looks anxious.  Discharge Instructions Discharge Instructions     Diet general   Complete by: As directed    Discharge instructions   Complete by: As directed    It has been a pleasure taking care of you!  You were hospitalized due to troponin infection for which you have been treated with antibiotics.  Your symptoms improved.  We are discharging you more antibiotics to complete treatment course.  Continue using mouth rinse.  You may use ibuprofen or naproxen as needed for pain.  Follow-up with your primary care doctor in 1 to 2 weeks or sooner if needed.   Take care,   Increase activity slowly   Complete by: As directed       Allergies as of 05/05/2023   No Known Allergies      Medication List     STOP taking these medications    azithromycin 250 MG tablet Commonly known as: Zithromax Z-Pak       TAKE these medications    acetaminophen 500 MG tablet Commonly known as: TYLENOL Take 1,000 mg by mouth daily as needed for moderate pain.   albuterol 108 (90 Base) MCG/ACT inhaler Commonly known as: VENTOLIN HFA INHALE 1 TO 2 PUFFS INTO THE LUNGS EVERY 6  HOURS AS NEEDED FOR WHEEZING OR SHORTNESS OF BREATH What changed:  how much to take how to take this when to take this reasons to take this additional instructions   amoxicillin-clavulanate 875-125 MG tablet Commonly known as: AUGMENTIN Take 1 tablet by mouth 2 (two) times daily for 5 days.   aspirin EC 81 MG tablet Take 81 mg by mouth daily. Swallow whole.   atorvastatin 20 MG tablet Commonly known as: LIPITOR Take 1 tablet (20 mg total) by mouth daily. What changed: when to take this   Breztri Aerosphere 160-9-4.8 MCG/ACT Aero Generic drug: Budeson-Glycopyrrol-Formoterol Inhale 2 puffs by mouth twice daily. Rinse mouth out after use.   busPIRone 5 MG tablet Commonly known as: BUSPAR Take 5 mg by mouth 3 (three) times daily.   chlorhexidine 0.12 % solution Commonly known as: PERIDEX Use as directed 15 mLs in the mouth or throat 2 (two) times daily.   clobetasol  0.05 % external solution Commonly known as: TEMOVATE Apply 1 Application topically 2 (two) times daily as needed (irritation).   dicyclomine 10 MG capsule Commonly known as: BENTYL Take 1 tablet by mouth every 6 hours as needed for abdominal cramping. What changed:  how much to take how to take this when to take this reasons to take this additional instructions   hydrOXYzine 25 MG capsule Commonly known as: VISTARIL Take 1 capsule by mouth 3 times daily as needed What changed:  how much to take when to take this reasons to take this   ibuprofen 200 MG tablet Commonly known as: ADVIL Take 600-800 mg by mouth every 6 (six) hours as needed for moderate pain.   levothyroxine 150 MCG tablet Commonly known as: SYNTHROID Take 1 tablet (150 mcg total) by mouth daily.   lidocaine 2 % solution Commonly known as: XYLOCAINE Use as directed 15 mLs in the mouth or throat every 6 (six) hours as needed for mouth pain. Gargle and spit   Melatonin 10 MG Caps Take 10 mg by mouth at bedtime.   modafinil 200 MG  tablet Commonly known as: PROVIGIL Take 1 tablet by mouth every morning What changed:  how much to take how to take this when to take this   multivitamin with minerals Tabs tablet Take 1 tablet by mouth daily.   omeprazole 40 MG capsule Commonly known as: PRILOSEC TAKE 1 CAPSULE(40 MG) BY MOUTH DAILY AS NEEDED FOR HEARTBURN What changed: See the new instructions.   potassium chloride SA 20 MEQ tablet Commonly known as: KLOR-CON M Take 1 tablet (20 mEq total) by mouth 2 (two) times daily for 4 doses.        Consultations: ENT  Procedures/Studies:   CT Soft Tissue Neck W Contrast  Result Date: 05/03/2023 CLINICAL DATA:  Epiglottitis or tonsillitis suspected EXAM: CT NECK WITH CONTRAST TECHNIQUE: Multidetector CT imaging of the neck was performed using the standard protocol following the bolus administration of intravenous contrast. RADIATION DOSE REDUCTION: This exam was performed according to the departmental dose-optimization program which includes automated exposure control, adjustment of the mA and/or kV according to patient size and/or use of iterative reconstruction technique. CONTRAST:  75mL OMNIPAQUE IOHEXOL 300 MG/ML  SOLN COMPARISON:  None Available. FINDINGS: Pharynx and larynx: There is diffuse tonsillar hypertrophy with a 1.4 x 0.9 cm low-density fluid collection in the right palatine tonsils, worrisome for a intra tonsillar abscess. There is soft tissue stranding in the right parapharyngeal space and along the right pharyngeal mucosa to the level of the epiglottis. The right aspect of the epiglottis is asymmetrically thickened, which can be seen in the setting of epiglottitis. Salivary glands: The right submandibular gland is asymmetrically enlarged with mild surrounding fat stranding. No mass, or stone. Thyroid: Normal thyroid tissue is not visualized at the expected location of the thyroid gland. There is a hyperdense lesion of the base of the tongue (series 3, image 41)  measuring 1.7 x 1.7 x 2.3 cm. This could represent a contrast-enhancing mass or ectopic thyroid tissue. Recommend further evaluation with a nuclear medicine thyroid uptake scan. Lymph nodes: None enlarged or abnormal density. Vascular: Negative. Limited intracranial: Negative. Visualized orbits: Negative. Mastoids and visualized paranasal sinuses: Clear. Skeleton: No acute or aggressive process. Upper chest: Negative. Other: There is subcutaneous soft tissue stranding in the Peri and submandibular soft tissues on the right (series 3, image 45) IMPRESSION: 1. Diffuse tonsillar hypertrophy with a 1.4 x 0.9 cm low-density fluid collection in  the right palatine tonsils, worrisome for an intra tonsillar abscess. 2. Soft tissue stranding in the right parapharyngeal space and along the right pharyngeal mucosa to the level of the epiglottis, which can be seen in the setting of epiglottitis. 3. Asymmetric enlargement of the right submandibular gland with mild surrounding fat stranding, which can be seen in the setting of sialoadenitis. 4. Midline hyperdense lesion at the base of the tongue measuring 1.7 x 1.7 x 2.3 cm. This could represent a contrast-enhancing mass or ectopic thyroid tissue. Recommend further evaluation with a nuclear medicine thyroid uptake scan. Electronically Signed   By: Lorenza Cambridge M.D.   On: 05/03/2023 11:59       The results of significant diagnostics from this hospitalization (including imaging, microbiology, ancillary and laboratory) are listed below for reference.     Microbiology: No results found for this or any previous visit (from the past 240 hour(s)).   Labs:  CBC: Recent Labs  Lab 05/02/23 1147 05/03/23 1044 05/04/23 0607 05/05/23 0049  WBC 12.3* 14.8* 12.7* 9.9  NEUTROABS 9.7* 12.1*  --   --   HGB 13.9 13.3 13.2 12.2  HCT 42.7 39.8 40.3 37.1  MCV 89.3 87.9 87.0 89.6  PLT 375.0 304 315 276   BMP &GFR Recent Labs  Lab 05/03/23 1044 05/04/23 0607 05/05/23 0049   NA 134* 136 138  K 3.8 3.7 3.0*  CL 101 99 105  CO2 25 25 24   GLUCOSE 130* 123* 114*  BUN 10 10 11   CREATININE 0.67 0.65 0.65  CALCIUM 8.1* 8.8* 8.0*  MG  --   --  1.7  PHOS  --   --  5.1*   Estimated Creatinine Clearance: 92.9 mL/min (by C-G formula based on SCr of 0.65 mg/dL). Liver & Pancreas: Recent Labs  Lab 05/05/23 0049  ALBUMIN 2.5*   No results for input(s): "LIPASE", "AMYLASE" in the last 168 hours. No results for input(s): "AMMONIA" in the last 168 hours. Diabetic: No results for input(s): "HGBA1C" in the last 72 hours. No results for input(s): "GLUCAP" in the last 168 hours. Cardiac Enzymes: No results for input(s): "CKTOTAL", "CKMB", "CKMBINDEX", "TROPONINI" in the last 168 hours. No results for input(s): "PROBNP" in the last 8760 hours. Coagulation Profile: No results for input(s): "INR", "PROTIME" in the last 168 hours. Thyroid Function Tests: No results for input(s): "TSH", "T4TOTAL", "FREET4", "T3FREE", "THYROIDAB" in the last 72 hours. Lipid Profile: No results for input(s): "CHOL", "HDL", "LDLCALC", "TRIG", "CHOLHDL", "LDLDIRECT" in the last 72 hours. Anemia Panel: No results for input(s): "VITAMINB12", "FOLATE", "FERRITIN", "TIBC", "IRON", "RETICCTPCT" in the last 72 hours. Urine analysis: No results found for: "COLORURINE", "APPEARANCEUR", "LABSPEC", "PHURINE", "GLUCOSEU", "HGBUR", "BILIRUBINUR", "KETONESUR", "PROTEINUR", "UROBILINOGEN", "NITRITE", "LEUKOCYTESUR" Sepsis Labs: Invalid input(s): "PROCALCITONIN", "LACTICIDVEN"   SIGNED:  Almon Hercules, MD  [facility] 05/05/2023, 11:38 AM

## 2023-05-05 NOTE — Progress Notes (Signed)
I am pt's PCP. Briefly spoke with pt on 6/8. She was feeling better and was deciding to hold off on surgery and see how things would go. Pleased with care.   Please don't hesitate to reach out to me if there is anything I can assist with.  Ileene Patrick C: [phone]

## 2023-05-05 NOTE — Progress Notes (Signed)
Patient ID: Pamela [name], female   DOB: [birth year], 62 y.o.   MRN: [id number]   She feels much better. She is able to open mouth well. She has only about 1-2 pain level. She wants to go home  Exam the tonsil area is decreased in swelling and there is less edema and erythema. She opens completely normal.    She can go home now with po antibiotics. Augmentin would be great.  I told her to come back if she is worse or not completely resolved in about 3 more days. [phone] for questions.

## 2023-05-06 ENCOUNTER — Telehealth: Payer: Self-pay

## 2023-05-06 NOTE — Transitions of Care (Post Inpatient/ED Visit) (Signed)
05/06/2023  Name: Pamela [name] MRN: [id number] DOB: 21-Jan-1961  Today's TOC FU Call Status: Today's TOC FU Call Status:: Successful TOC FU Call Competed TOC FU Call Complete Date: 05/06/23  Transition Care Management Follow-up Telephone Call Date of Discharge: 05/05/23 Discharge Facility: [facility]) Type of Discharge: Inpatient Admission Primary Inpatient Discharge Diagnosis:: peritonsilitis abscess How have you been since you were released from the hospital?: Better Any questions or concerns?: No  Items Reviewed: Did you receive and understand the discharge instructions provided?: Yes Medications obtained,verified, and reconciled?: Yes (Medications Reviewed) Any new allergies since your discharge?: No Dietary orders reviewed?: Yes Do you have support at home?: Yes People in Home: friend(s)  Medications Reviewed Today: Medications Reviewed Today     Reviewed by Karena Addison, LPN (Licensed Practical Nurse) on 05/06/23 at 1032  Med List Status: <None>   Medication Order Taking? Sig Documenting Provider Last Dose Status Informant  acetaminophen (TYLENOL) 500 MG tablet [id number] Yes Take 1,000 mg by mouth daily as needed for moderate pain. [provider] Taking Active Self, Pharmacy Records  albuterol (VENTOLIN HFA) 108 (90 Base) MCG/ACT inhaler [id number] Yes INHALE 1 TO 2 PUFFS INTO THE LUNGS EVERY 6 HOURS AS NEEDED FOR WHEEZING OR SHORTNESS OF BREATH  Patient taking differently: Inhale 1-2 puffs into the lungs every 6 (six) hours as needed for wheezing or shortness of breath.   [name], DO Taking Active Self, Pharmacy Records  amoxicillin-clavulanate (AUGMENTIN) 875-125 MG tablet [id number] Yes Take 1 tablet by mouth 2 (two) times daily for 5 days. Almon Hercules, MD Taking Active   aspirin EC 81 MG tablet [id number] Yes Take 81 mg by mouth daily. Swallow whole. [provider] Taking Active Self, Pharmacy Records  atorvastatin  (LIPITOR) 20 MG tablet [id number] Yes Take 1 tablet (20 mg total) by mouth daily.  Patient taking differently: Take 20 mg by mouth at bedtime.   [name], DO Taking Active Self, Pharmacy Records  Budeson-Glycopyrrol-Formoterol [facility] AEROSPHERE) 160-9-4.8 MCG/ACT Sandrea Matte [id number] Yes Inhale 2 puffs by mouth twice daily. Rinse mouth out after use. [name], DO Taking Active Self, Pharmacy Records  busPIRone (BUSPAR) 5 MG tablet [id number] Yes Take 5 mg by mouth 3 (three) times daily. [provider] Taking Active Self, Pharmacy Records  chlorhexidine (PERIDEX) 0.12 % solution [id number] Yes Use as directed 15 mLs in the mouth or throat 2 (two) times daily. Almon Hercules, MD Taking Active   clobetasol (TEMOVATE) 0.05 % external solution [id number] Yes Apply 1 Application topically 2 (two) times daily as needed (irritation). [name], DO Taking Active Self, Pharmacy Records  dicyclomine (BENTYL) 10 MG capsule [id number] Yes Take 1 tablet by mouth every 6 hours as needed for abdominal cramping.  Patient taking differently: Take 10 mg by mouth every 6 (six) hours as needed for spasms (abdominal cramping).   [name], DO Taking Active Self, Pharmacy Records  hydrOXYzine (VISTARIL) 25 MG capsule [id number] Yes Take 1 capsule by mouth 3 times daily as needed  Patient taking differently: Take 25 mg by mouth 3 (three) times daily as needed for anxiety.    Taking Active Self, Pharmacy Records  ibuprofen (ADVIL) 200 MG tablet [id number] Yes Take 600-800 mg by mouth every 6 (six) hours as needed for moderate pain. [provider] Taking Active Self, Pharmacy Records  levothyroxine (SYNTHROID) 150 MCG tablet [id number] Yes Take 1 tablet (150 mcg total) by mouth daily. [name], DO Taking Active  Self, Pharmacy Records  lidocaine (XYLOCAINE) 2 % solution [id number] Yes Use as directed 15 mLs in the mouth or throat every 6 (six) hours  as needed for mouth pain. Gargle and spit Olive Bass, FNP Taking Active Self, Pharmacy Records  Melatonin 10 MG CAPS [id number] Yes Take 10 mg by mouth at bedtime. [provider] Taking Active Self, Pharmacy Records  modafinil (PROVIGIL) 200 MG tablet [id number] Yes Take 1 tablet by mouth every morning  Patient taking differently: Take 200 mg by mouth in the morning.    Taking Active Self, Pharmacy Records  Multiple Vitamin (MULTIVITAMIN WITH MINERALS) TABS tablet [id number] Yes Take 1 tablet by mouth daily. [provider] Taking Active Self, Pharmacy Records  omeprazole (PRILOSEC) 40 MG capsule [id number] Yes TAKE 1 CAPSULE(40 MG) BY MOUTH DAILY AS NEEDED FOR HEARTBURN  Patient taking differently: Take 40 mg by mouth daily as needed (heartburn).   [name], DO Taking Active Self, Pharmacy Records  potassium chloride SA (KLOR-CON M) 20 MEQ tablet [id number] Yes Take 1 tablet (20 mEq total) by mouth 2 (two) times daily for 4 doses. Almon Hercules, MD Taking Active             Home Care and Equipment/Supplies: Were Home Health Services Ordered?: NA Any new equipment or medical supplies ordered?: NA  Functional Questionnaire: Do you need assistance with bathing/showering or dressing?: No Do you need assistance with meal preparation?: No Do you need assistance with eating?: No Do you have difficulty maintaining continence: No Do you need assistance with getting out of bed/getting out of a chair/moving?: No Do you have difficulty managing or taking your medications?: No  Follow up appointments reviewed: Specialist Hospital Follow-up appointment confirmed?: No Reason Specialist Follow-Up Not Confirmed: Patient has Specialist Provider Number and will Call for Appointment Do you need transportation to your follow-up appointment?: No Do you understand care options if your condition(s) worsen?: Yes-patient verbalized understanding    SIGNATURE  Karena Addison, LPN [facility] Nurse Health Advisor Direct Dial [phone]

## 2023-05-10 ENCOUNTER — Encounter: Payer: Self-pay | Admitting: Family Medicine

## 2023-05-15 ENCOUNTER — Other Ambulatory Visit (HOSPITAL_BASED_OUTPATIENT_CLINIC_OR_DEPARTMENT_OTHER): Payer: Self-pay

## 2023-05-15 ENCOUNTER — Ambulatory Visit: Payer: BC Managed Care – PPO | Admitting: Family Medicine

## 2023-05-15 ENCOUNTER — Encounter: Payer: Self-pay | Admitting: Family Medicine

## 2023-05-15 VITALS — BP 124/80 | HR 93 | Temp 98.0°F | Ht 64.0 in | Wt 253.2 lb

## 2023-05-15 DIAGNOSIS — J029 Acute pharyngitis, unspecified: Secondary | ICD-10-CM | POA: Diagnosis not present

## 2023-05-15 DIAGNOSIS — G8929 Other chronic pain: Secondary | ICD-10-CM

## 2023-05-15 DIAGNOSIS — R109 Unspecified abdominal pain: Secondary | ICD-10-CM

## 2023-05-15 MED ORDER — FLUCONAZOLE 150 MG PO TABS
ORAL_TABLET | ORAL | 0 refills | Status: DC
  Filled 2023-05-15: qty 2, 3d supply, fill #0

## 2023-05-15 MED ORDER — AMITRIPTYLINE HCL 10 MG PO TABS
10.0000 mg | ORAL_TABLET | Freq: Every day | ORAL | 1 refills | Status: DC
Start: 2023-05-15 — End: 2023-06-26
  Filled 2023-05-15: qty 30, 30d supply, fill #0
  Filled 2023-06-11: qty 30, 30d supply, fill #1

## 2023-05-15 NOTE — Progress Notes (Signed)
Chief Complaint  Patient presents with   Hospitalization Follow-up    Subjective: Patient is a 62 y.o. female here for hospitalization follow-up.  Patient was hospitalized for a tonsillar abscess.  Fortunately she did not require drainage and improved with IV antibiotics.  She was discharged on 5 days of Augmentin which she has since finished.  She thinks she has developed a yeast infection from this.  Pain is steadily improving.  Patient has a greater than 1 year history of abdominal cramping.  It comes and goes but is somewhat associated with stress.  No bleeding, unintentional weight loss, nighttime awakenings, or fevers.  Surgery on her lap band did not seem to affect it.  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 No accessory muscle use Abdomen: Bowel sounds present, soft, nontender, nondistended currently Psych: Age appropriate judgment and insight, normal affect and mood  Assessment and Plan: Sore throat - Plan: CBC, Basic metabolic panel  Chronic abdominal pain - Plan: amitriptyline (ELAVIL) 10 MG tablet  Follow-up on hospitalization labs as above.  Seems like she is resolving well.  Has a  follow-up with the ENT team coming up. Chronic, uncontrolled.  Probably IBS.  Elavil 10 mg nightly sent in.  Follow-up in 1 month to recheck. The patient voiced understanding and agreement to the plan.  [name] [place], DO 05/15/23  1:56 PM

## 2023-05-15 NOTE — Patient Instructions (Signed)
Give [name] 2-3 business days to get the results of your labs back.   Keep the diet clean and stay active.  Keep your follow up with the ENT team.   Let us know if you need anything.

## 2023-05-16 LAB — BASIC METABOLIC PANEL
BUN: 10 mg/dL (ref 6–23)
CO2: 28 mEq/L (ref 19–32)
Calcium: 9 mg/dL (ref 8.4–10.5)
Chloride: 101 mEq/L (ref 96–112)
Creatinine, Ser: 0.62 mg/dL (ref 0.40–1.20)
GFR: 95.12 mL/min (ref >60.00)
Glucose, Bld: 103 mg/dL — ABNORMAL HIGH (ref 70–99)
Potassium: 4.3 mEq/L (ref 3.5–5.1)
Sodium: 139 mEq/L (ref 135–145)

## 2023-05-16 LAB — CBC
HCT: 43.9 % (ref 36.0–46.0)
Hemoglobin: 14.2 g/dL (ref 12.0–15.0)
MCHC: 32.4 g/dL (ref 30.0–36.0)
MCV: 89.1 fl (ref 78.0–100.0)
Platelets: 468 10*3/uL — ABNORMAL HIGH (ref 150.0–400.0)
RBC: 4.93 Mil/uL (ref 3.87–5.11)
RDW: 15.3 % (ref 11.5–15.5)
WBC: 9.3 10*3/uL (ref 4.0–10.5)

## 2023-06-10 ENCOUNTER — Other Ambulatory Visit (HOSPITAL_COMMUNITY): Payer: Self-pay

## 2023-06-10 ENCOUNTER — Other Ambulatory Visit: Payer: Self-pay | Admitting: Family Medicine

## 2023-06-10 ENCOUNTER — Other Ambulatory Visit: Payer: Self-pay

## 2023-06-10 DIAGNOSIS — J449 Chronic obstructive pulmonary disease, unspecified: Secondary | ICD-10-CM

## 2023-06-10 MED ORDER — BREZTRI AEROSPHERE 160-9-4.8 MCG/ACT IN AERO
INHALATION_SPRAY | RESPIRATORY_TRACT | 5 refills | Status: DC
Start: 2023-06-10 — End: 2024-01-17
  Filled 2023-06-10: qty 10.7, 30d supply, fill #0
  Filled 2023-07-16: qty 10.7, 30d supply, fill #1
  Filled 2023-08-11: qty 10.7, 30d supply, fill #2
  Filled 2023-10-14: qty 10.7, 30d supply, fill #3
  Filled 2023-11-02 – 2023-11-07 (×3): qty 10.7, 30d supply, fill #4
  Filled 2023-12-18: qty 10.7, 30d supply, fill #5

## 2023-06-20 ENCOUNTER — Ambulatory Visit: Payer: BC Managed Care – PPO | Admitting: Physician Assistant

## 2023-06-20 ENCOUNTER — Telehealth: Payer: Self-pay | Admitting: Family Medicine

## 2023-06-20 ENCOUNTER — Other Ambulatory Visit (HOSPITAL_BASED_OUTPATIENT_CLINIC_OR_DEPARTMENT_OTHER): Payer: Self-pay

## 2023-06-20 VITALS — BP 137/80 | HR 94 | Temp 98.0°F | Ht 64.0 in | Wt 260.0 lb

## 2023-06-20 DIAGNOSIS — B379 Candidiasis, unspecified: Secondary | ICD-10-CM

## 2023-06-20 DIAGNOSIS — J36 Peritonsillar abscess: Secondary | ICD-10-CM | POA: Diagnosis not present

## 2023-06-20 DIAGNOSIS — T3695XA Adverse effect of unspecified systemic antibiotic, initial encounter: Secondary | ICD-10-CM

## 2023-06-20 MED ORDER — FLUCONAZOLE 150 MG PO TABS
150.0000 mg | ORAL_TABLET | Freq: Once | ORAL | 0 refills | Status: AC
Start: 2023-06-20 — End: 2023-06-21
  Filled 2023-06-20: qty 1, 1d supply, fill #0

## 2023-06-20 MED ORDER — CHLORHEXIDINE GLUCONATE 0.12 % MT SOLN
15.0000 mL | Freq: Two times a day (BID) | OROMUCOSAL | 0 refills | Status: DC
Start: 2023-06-20 — End: 2023-10-14
  Filled 2023-06-20: qty 473, 16d supply, fill #0

## 2023-06-20 MED ORDER — AMOXICILLIN-POT CLAVULANATE 875-125 MG PO TABS
1.0000 | ORAL_TABLET | Freq: Two times a day (BID) | ORAL | 0 refills | Status: AC
Start: 2023-06-20 — End: 2023-06-27
  Filled 2023-06-20: qty 14, 7d supply, fill #0

## 2023-06-20 NOTE — Telephone Encounter (Signed)
Pt has an appt on 06/26/23 as well

## 2023-06-20 NOTE — Progress Notes (Signed)
Established patient visit   Patient: Pamela [name]   DOB: 07-26-1961   62 y.o. Female  MRN: [id number] Visit Date: 06/20/2023  Today's healthcare provider: Alfredia Ferguson, PA-C   Cc. Right sided throat pain, history of tonsillar abscess  Subjective    HPI   Pt was admitted 05/03/23 for a tonsillar abscess, treated conservatively with IV abx. Seen in office 6/19 for follow up with continued improvement of pain.  She presents today with increased right sided throat pain for the last 2-3 days. She had a left over zpack and took two days of those antibiotics. She had been persistent with using chlorhexidine gargle. She has been taking alleve to manage pain.  Pt reports yeast infection from last round of antibiotics.  Denies trouble breathing, swallowing. Denies SOB.  Medications: Outpatient Medications Prior to Visit  Medication Sig   acetaminophen (TYLENOL) 500 MG tablet Take 1,000 mg by mouth daily as needed for moderate pain.   albuterol (VENTOLIN HFA) 108 (90 Base) MCG/ACT inhaler INHALE 1 TO 2 PUFFS INTO THE LUNGS EVERY 6 HOURS AS NEEDED FOR WHEEZING OR SHORTNESS OF BREATH (Patient taking differently: Inhale 1-2 puffs into the lungs every 6 (six) hours as needed for wheezing or shortness of breath.)   amitriptyline (ELAVIL) 10 MG tablet Take 1 tablet (10 mg total) by mouth at bedtime.   aspirin EC 81 MG tablet Take 81 mg by mouth daily. Swallow whole.   atorvastatin (LIPITOR) 20 MG tablet Take 1 tablet (20 mg total) by mouth daily. (Patient taking differently: Take 20 mg by mouth at bedtime.)   Budeson-Glycopyrrol-Formoterol (BREZTRI AEROSPHERE) 160-9-4.8 MCG/ACT AERO Inhale 2 puffs by mouth twice daily. Rinse mouth out after use.   busPIRone (BUSPAR) 5 MG tablet Take 5 mg by mouth 3 (three) times daily.   clobetasol (TEMOVATE) 0.05 % external solution Apply 1 Application topically 2 (two) times daily as needed (irritation).   dicyclomine (BENTYL) 10 MG capsule Take 1  tablet by mouth every 6 hours as needed for abdominal cramping. (Patient taking differently: Take 10 mg by mouth every 6 (six) hours as needed for spasms (abdominal cramping).)   hydrOXYzine (VISTARIL) 25 MG capsule Take 1 capsule by mouth 3 times daily as needed (Patient taking differently: Take 25 mg by mouth 3 (three) times daily as needed for anxiety.)   ibuprofen (ADVIL) 200 MG tablet Take 600-800 mg by mouth every 6 (six) hours as needed for moderate pain.   levothyroxine (SYNTHROID) 150 MCG tablet Take 1 tablet (150 mcg total) by mouth daily.   Melatonin 10 MG CAPS Take 10 mg by mouth at bedtime.   modafinil (PROVIGIL) 200 MG tablet Take 1 tablet by mouth every morning (Patient taking differently: Take 200 mg by mouth in the morning.)   Multiple Vitamin (MULTIVITAMIN WITH MINERALS) TABS tablet Take 1 tablet by mouth daily.   omeprazole (PRILOSEC) 40 MG capsule TAKE 1 CAPSULE(40 MG) BY MOUTH DAILY AS NEEDED FOR HEARTBURN (Patient taking differently: Take 40 mg by mouth daily as needed (heartburn).)   [DISCONTINUED] chlorhexidine (PERIDEX) 0.12 % solution Use as directed 15 mLs in the mouth or throat 2 (two) times daily.   [DISCONTINUED] fluconazole (DIFLUCAN) 150 MG tablet Take 1 tablet now, repeat in 72 hours if no improvement.   [DISCONTINUED] lidocaine (XYLOCAINE) 2 % solution Use as directed 15 mLs in the mouth or throat every 6 (six) hours as needed for mouth pain. Gargle and spit   No facility-administered medications prior  to visit.      Objective    BP 137/80 (BP Location: Right Arm, Patient Position: Sitting)   Pulse 94   Temp 98 F (36.7 C) (Oral)   Ht 5\' 4"  (1.626 m)   Wt 260 lb (117.9 kg)   SpO2 100%   BMI 44.63 kg/m   Physical Exam Vitals reviewed.  Constitutional:      Appearance: She is not ill-appearing.  HENT:     Head: Normocephalic.     Mouth/Throat:     Pharynx: Posterior oropharyngeal erythema present. No oropharyngeal exudate.     Comments: Not able to  visualize any abscess.  No soft palate edema  Eyes:     Conjunctiva/sclera: Conjunctivae normal.  Cardiovascular:     Rate and Rhythm: Normal rate.  Pulmonary:     Effort: Pulmonary effort is normal. No respiratory distress.  Lymphadenopathy:     Cervical: No cervical adenopathy.  Neurological:     General: No focal deficit present.     Mental Status: She is alert and oriented to person, place, and time.  Psychiatric:        Mood and Affect: Mood normal.        Behavior: Behavior normal.      No results found for any visits on 06/20/23.  Assessment & Plan     1. Tonsillar abscess Strongly recommended pt make appt with ENT as rec from hospitalization. Pt reports she needs new referral, sent.  Cont with chlorhexidine rinse, refilled. Encouraged warm salt water gargles, warm liquids in general.  Will send 7 days of augmentin. Cautioned for any trouble swallowing, breathing, hoarseness- to return back to ED.   Advised that if her symptoms improve but then return again, likely needs to see ENT for definitive management/I&D  - chlorhexidine (PERIDEX) 0.12 % solution; Use as directed 15 mLs in the mouth or throat 2 (two) times daily.  Dispense: 473 mL; Refill: 0 - amoxicillin-clavulanate (AUGMENTIN) 875-125 MG tablet; Take 1 tablet by mouth 2 (two) times daily for 7 days.  Dispense: 14 tablet; Refill: 0 - Ambulatory referral to ENT  2. Antibiotic-induced yeast infection Given history of abx inducted yeast infection, sending diflucan for post-abx course - fluconazole (DIFLUCAN) 150 MG tablet; Take 1 tablet (150 mg total) by mouth once for 1 dose.  Dispense: 1 tablet; Refill: 0   Return if symptoms worsen or fail to improve. Pt will keep f/u appt with PCP in 1 week     I, Alfredia Ferguson, PA-C have reviewed all documentation for this visit. The documentation on  06/20/23   for the exam, diagnosis, procedures, and orders are all accurate and complete.    Alfredia Ferguson, PA-C  [facility] [phone] (phone) [phone] (fax)  [facility]

## 2023-06-20 NOTE — Telephone Encounter (Signed)
Spoke with Alfredia Ferguson about patient and she will see patient today at 10:00 am patient aware of appointment.

## 2023-06-20 NOTE — Telephone Encounter (Signed)
Patient called and would like to speak to some one regarding an abscess that has developed in throat. She states was in hospital around four weeks ago for same reason. She is now feeling a lot of pain and would like to take care of it asap.

## 2023-06-26 ENCOUNTER — Other Ambulatory Visit (HOSPITAL_BASED_OUTPATIENT_CLINIC_OR_DEPARTMENT_OTHER): Payer: Self-pay

## 2023-06-26 ENCOUNTER — Encounter: Payer: Self-pay | Admitting: Family Medicine

## 2023-06-26 ENCOUNTER — Ambulatory Visit: Payer: BC Managed Care – PPO | Admitting: Family Medicine

## 2023-06-26 ENCOUNTER — Other Ambulatory Visit: Payer: Self-pay | Admitting: Family Medicine

## 2023-06-26 VITALS — BP 136/80 | HR 85 | Temp 98.1°F | Ht 64.0 in | Wt 259.1 lb

## 2023-06-26 DIAGNOSIS — G8929 Other chronic pain: Secondary | ICD-10-CM

## 2023-06-26 DIAGNOSIS — R109 Unspecified abdominal pain: Secondary | ICD-10-CM

## 2023-06-26 MED ORDER — AMITRIPTYLINE HCL 10 MG PO TABS
10.0000 mg | ORAL_TABLET | Freq: Every day | ORAL | 3 refills | Status: DC
  Filled 2023-06-26 – 2023-07-16 (×2): qty 90, 90d supply, fill #0
  Filled 2023-10-14: qty 90, 90d supply, fill #1
  Filled 2024-02-07: qty 90, 90d supply, fill #2
  Filled 2024-03-19 – 2024-05-19 (×2): qty 90, 90d supply, fill #3

## 2023-06-26 NOTE — Patient Instructions (Signed)
Let us know if you need anything.

## 2023-06-26 NOTE — Progress Notes (Addendum)
Chief Complaint  Patient presents with   Follow-up    Subjective: Patient is a 62 y.o. female here for IBS.  Started on Elavil 10 mg qhs for IBS. Reports compliance, no AE's. Reports symptoms are near resolved. BM's are normal again.   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)   History of blood transfusion 2006   HLD (hyperlipidemia)    Hypothyroidism    Obesity    Pneumonia    Sleep apnea    cpap   Thyroid disease     Objective: BP 136/80 (BP Location: Left Arm, Patient Position: Sitting, Cuff Size: Normal)   Pulse 85   Temp 98.1 F (36.7 C) (Oral)   Ht 5\' 4"  (1.626 m)   Wt 259 lb 2 oz (117.5 kg)   SpO2 94%   BMI 44.48 kg/m  General: Awake, appears stated age Heart: RRR, no LE edema Lungs: CTAB, no rales, wheezes or rhonchi. No accessory muscle use Mouth: MMM Abd: BS+, S, NT, ND Psych: Age appropriate judgment and insight, normal affect and mood  Assessment and Plan: Chronic abdominal pain - Plan: amitriptyline (ELAVIL) 10 MG tablet  Chronic, stable. Cont Elavil 10 mg qhs. F/u in 6 mo for CPE or prn.  The patient voiced understanding and agreement to the plan.  [name] [place], DO 06/26/23  9:26 AM

## 2023-06-27 ENCOUNTER — Other Ambulatory Visit: Payer: Self-pay

## 2023-06-27 MED ORDER — BUSPIRONE HCL 5 MG PO TABS
5.0000 mg | ORAL_TABLET | Freq: Three times a day (TID) | ORAL | 3 refills | Status: DC
  Filled 2023-06-27: qty 180, 60d supply, fill #0
  Filled 2024-01-17: qty 180, 60d supply, fill #1

## 2023-06-27 NOTE — Telephone Encounter (Signed)
Ok to refill 

## 2023-07-04 ENCOUNTER — Encounter: Payer: Self-pay | Admitting: Family Medicine

## 2023-07-05 ENCOUNTER — Other Ambulatory Visit: Payer: Self-pay | Admitting: Family Medicine

## 2023-07-05 ENCOUNTER — Other Ambulatory Visit (HOSPITAL_BASED_OUTPATIENT_CLINIC_OR_DEPARTMENT_OTHER): Payer: Self-pay

## 2023-07-05 MED ORDER — FLUCONAZOLE 150 MG PO TABS
ORAL_TABLET | ORAL | 0 refills | Status: DC
  Filled 2023-07-05: qty 2, 1d supply, fill #0
  Filled 2023-08-11: qty 2, 3d supply, fill #0

## 2023-07-15 ENCOUNTER — Encounter: Payer: Self-pay | Admitting: Family Medicine

## 2023-07-16 ENCOUNTER — Other Ambulatory Visit (HOSPITAL_BASED_OUTPATIENT_CLINIC_OR_DEPARTMENT_OTHER): Payer: Self-pay

## 2023-07-16 MED ORDER — ALBUTEROL SULFATE HFA 108 (90 BASE) MCG/ACT IN AERS
1.0000 | INHALATION_SPRAY | Freq: Four times a day (QID) | RESPIRATORY_TRACT | 5 refills | Status: DC | PRN
  Filled 2023-07-16: qty 6.7, 25d supply, fill #0
  Filled 2023-08-11: qty 6.7, 25d supply, fill #1
  Filled 2023-11-02: qty 6.7, 25d supply, fill #2
  Filled 2023-11-07: qty 6.7, 30d supply, fill #2
  Filled 2023-11-11 – 2023-12-18 (×3): qty 6.7, 25d supply, fill #2
  Filled 2024-01-17: qty 6.7, 25d supply, fill #3
  Filled 2024-02-06: qty 6.7, 25d supply, fill #4
  Filled 2024-02-22 – 2024-02-25 (×2): qty 6.7, 25d supply, fill #5

## 2023-07-17 ENCOUNTER — Encounter: Payer: Self-pay | Admitting: Family Medicine

## 2023-07-17 ENCOUNTER — Other Ambulatory Visit (HOSPITAL_BASED_OUTPATIENT_CLINIC_OR_DEPARTMENT_OTHER): Payer: Self-pay

## 2023-07-18 ENCOUNTER — Other Ambulatory Visit (HOSPITAL_BASED_OUTPATIENT_CLINIC_OR_DEPARTMENT_OTHER): Payer: Self-pay

## 2023-07-22 ENCOUNTER — Other Ambulatory Visit (HOSPITAL_BASED_OUTPATIENT_CLINIC_OR_DEPARTMENT_OTHER): Payer: Self-pay

## 2023-07-26 ENCOUNTER — Other Ambulatory Visit (HOSPITAL_BASED_OUTPATIENT_CLINIC_OR_DEPARTMENT_OTHER): Payer: Self-pay

## 2023-07-26 ENCOUNTER — Encounter: Payer: Self-pay | Admitting: Family Medicine

## 2023-07-26 ENCOUNTER — Other Ambulatory Visit: Payer: Self-pay | Admitting: Family Medicine

## 2023-07-26 MED ORDER — MODAFINIL 200 MG PO TABS
200.0000 mg | ORAL_TABLET | Freq: Every morning | ORAL | 0 refills | Status: DC
  Filled 2023-07-26: qty 30, 30d supply, fill #0

## 2023-07-30 ENCOUNTER — Other Ambulatory Visit: Payer: Self-pay

## 2023-08-08 ENCOUNTER — Other Ambulatory Visit: Payer: Self-pay | Admitting: Family Medicine

## 2023-08-09 ENCOUNTER — Telehealth: Payer: Self-pay

## 2023-08-09 ENCOUNTER — Telehealth (INDEPENDENT_AMBULATORY_CARE_PROVIDER_SITE_OTHER): Payer: BC Managed Care – PPO | Admitting: Family Medicine

## 2023-08-09 ENCOUNTER — Encounter: Payer: Self-pay | Admitting: Family Medicine

## 2023-08-09 DIAGNOSIS — G4733 Obstructive sleep apnea (adult) (pediatric): Secondary | ICD-10-CM

## 2023-08-09 NOTE — Progress Notes (Signed)
Chief Complaint  Patient presents with   Follow-up    Discuss medication    Subjective: Patient is a 62 y.o. female here for med ck. We are interacting via web portal for an electronic face-to-face visit. I verified patient's ID using 2 identifiers. Patient agreed to proceed with visit via this method. Patient is at home, I am at office. Patient and I are present for visit.   Pt was started on modafinil 200 mg/d by psychiatry for OSA.  She is compliant and reports no adverse effects.  Significantly helped her quality of life with alertness and daytime fatigue.  She is compliant with her CPAP which also helps.  Her insurance told her that they would no longer accept prescriptions through a nurse practitioner and that she would need to get it filled through a physician.  Past Medical History:  Diagnosis Date   Anemia    Asthma    Colon polyps    COPD (chronic obstructive pulmonary disease) (HCC)    Depression    GERD (gastroesophageal reflux disease)    Heart murmur    childhood   History of alcohol abuse    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)   History of blood transfusion 2006   HLD (hyperlipidemia)    Hypothyroidism    Obesity    Sleep apnea    cpap   Thyroid disease     Objective: No conversational dyspnea Age appropriate judgment and insight Nml affect and mood  Assessment and Plan: OSA (obstructive sleep apnea)  Chronic, currently stable.  Appreciate sleep team.  Will continue modafinil 200 mg daily.  Follow-up in 6 months for physical or as needed. The patient voiced understanding and agreement to the plan.  [name] [place], DO 08/09/23  11:47 AM

## 2023-08-09 NOTE — Telephone Encounter (Signed)
Pharmacy Patient Advocate Encounter   Received notification from CoverMyMeds that prior authorization for Modafinil 200MG  tablets is required/requested.   Insurance verification completed.   The patient is insured through [employer] [facility] .   Per test claim: PA required; PA submitted to [employer] [facility] via CoverMyMeds Key/confirmation #/EOC [id number] Status is pending

## 2023-08-11 ENCOUNTER — Other Ambulatory Visit (HOSPITAL_COMMUNITY): Payer: Self-pay

## 2023-08-11 ENCOUNTER — Encounter (HOSPITAL_COMMUNITY): Payer: Self-pay

## 2023-08-12 ENCOUNTER — Other Ambulatory Visit (HOSPITAL_BASED_OUTPATIENT_CLINIC_OR_DEPARTMENT_OTHER): Payer: Self-pay

## 2023-08-12 ENCOUNTER — Other Ambulatory Visit: Payer: Self-pay

## 2023-08-12 ENCOUNTER — Other Ambulatory Visit (HOSPITAL_COMMUNITY): Payer: Self-pay

## 2023-08-12 NOTE — Telephone Encounter (Signed)
Pharmacy Patient Advocate Encounter  Received notification from [employer] that Prior Authorization for Modafinil 200MG  tablets has been APPROVED from 08/09/23 to 08/08/24. Ran test claim, Copay is $0. This test claim was processed through [facility]- copay amounts may vary at other pharmacies due to pharmacy/plan contracts, or as the patient moves through the different stages of their insurance plan.   PA #/Case ID/Reference #: [id number]

## 2023-08-12 NOTE — Telephone Encounter (Signed)
Patient made aware.

## 2023-09-08 ENCOUNTER — Encounter (INDEPENDENT_AMBULATORY_CARE_PROVIDER_SITE_OTHER): Payer: BC Managed Care – PPO | Admitting: Family Medicine

## 2023-09-08 DIAGNOSIS — J029 Acute pharyngitis, unspecified: Secondary | ICD-10-CM

## 2023-09-09 ENCOUNTER — Other Ambulatory Visit (HOSPITAL_BASED_OUTPATIENT_CLINIC_OR_DEPARTMENT_OTHER): Payer: Self-pay

## 2023-09-09 DIAGNOSIS — J029 Acute pharyngitis, unspecified: Secondary | ICD-10-CM | POA: Diagnosis not present

## 2023-09-09 MED ORDER — AMOXICILLIN-POT CLAVULANATE 875-125 MG PO TABS
1.0000 | ORAL_TABLET | Freq: Two times a day (BID) | ORAL | 0 refills | Status: DC
  Filled 2023-09-09: qty 20, 10d supply, fill #0

## 2023-09-09 MED ORDER — FLUCONAZOLE 150 MG PO TABS
ORAL_TABLET | ORAL | 0 refills | Status: DC
  Filled 2023-09-09: qty 2, 3d supply, fill #0

## 2023-09-09 NOTE — Telephone Encounter (Signed)

## 2023-09-30 ENCOUNTER — Other Ambulatory Visit: Payer: Self-pay

## 2023-09-30 DIAGNOSIS — E039 Hypothyroidism, unspecified: Secondary | ICD-10-CM

## 2023-09-30 DIAGNOSIS — R109 Unspecified abdominal pain: Secondary | ICD-10-CM

## 2023-09-30 MED ORDER — DICYCLOMINE HCL 10 MG PO CAPS
10.0000 mg | ORAL_CAPSULE | Freq: Four times a day (QID) | ORAL | 1 refills | Status: DC | PRN
Start: 2023-09-30 — End: 2024-01-16

## 2023-09-30 MED ORDER — LEVOTHYROXINE SODIUM 150 MCG PO TABS
150.0000 ug | ORAL_TABLET | Freq: Every day | ORAL | 2 refills | Status: DC
Start: 2023-09-30 — End: 2024-04-21

## 2023-10-02 ENCOUNTER — Other Ambulatory Visit: Payer: Self-pay

## 2023-10-02 MED ORDER — CLOBETASOL PROPIONATE 0.05 % EX SOLN: 1.0000 | Freq: Two times a day (BID) | CUTANEOUS | 1 refills | Status: DC | PRN

## 2023-10-14 ENCOUNTER — Other Ambulatory Visit: Payer: Self-pay | Admitting: Physician Assistant

## 2023-10-14 ENCOUNTER — Other Ambulatory Visit (HOSPITAL_COMMUNITY): Payer: Self-pay

## 2023-10-14 DIAGNOSIS — J36 Peritonsillar abscess: Secondary | ICD-10-CM

## 2023-10-14 MED ORDER — CHLORHEXIDINE GLUCONATE 0.12 % MT SOLN
15.0000 mL | Freq: Two times a day (BID) | OROMUCOSAL | 0 refills | Status: DC
  Filled 2023-10-14 (×2): qty 473, 16d supply, fill #0

## 2023-10-16 ENCOUNTER — Other Ambulatory Visit: Payer: Self-pay

## 2023-11-02 ENCOUNTER — Other Ambulatory Visit: Payer: Self-pay | Admitting: Family Medicine

## 2023-11-02 ENCOUNTER — Other Ambulatory Visit (HOSPITAL_BASED_OUTPATIENT_CLINIC_OR_DEPARTMENT_OTHER): Payer: Self-pay

## 2023-11-04 ENCOUNTER — Other Ambulatory Visit: Payer: Self-pay

## 2023-11-05 ENCOUNTER — Other Ambulatory Visit (HOSPITAL_COMMUNITY): Payer: Self-pay

## 2023-11-08 ENCOUNTER — Other Ambulatory Visit (HOSPITAL_COMMUNITY): Payer: Self-pay

## 2023-11-11 ENCOUNTER — Other Ambulatory Visit (HOSPITAL_COMMUNITY): Payer: Self-pay

## 2023-11-13 ENCOUNTER — Other Ambulatory Visit (HOSPITAL_BASED_OUTPATIENT_CLINIC_OR_DEPARTMENT_OTHER): Payer: Self-pay

## 2023-11-13 ENCOUNTER — Other Ambulatory Visit: Payer: Self-pay | Admitting: Family Medicine

## 2023-11-14 ENCOUNTER — Other Ambulatory Visit (HOSPITAL_BASED_OUTPATIENT_CLINIC_OR_DEPARTMENT_OTHER): Payer: Self-pay

## 2023-11-14 ENCOUNTER — Encounter (INDEPENDENT_AMBULATORY_CARE_PROVIDER_SITE_OTHER): Payer: BC Managed Care – PPO | Admitting: Family Medicine

## 2023-11-14 ENCOUNTER — Telehealth: Payer: Self-pay

## 2023-11-14 DIAGNOSIS — J36 Peritonsillar abscess: Secondary | ICD-10-CM | POA: Diagnosis not present

## 2023-11-14 MED ORDER — AMOXICILLIN-POT CLAVULANATE 875-125 MG PO TABS
1.0000 | ORAL_TABLET | Freq: Two times a day (BID) | ORAL | 0 refills | Status: DC
  Filled 2023-11-14: qty 20, 10d supply, fill #0

## 2023-11-14 MED ORDER — FLUCONAZOLE 150 MG PO TABS
ORAL_TABLET | ORAL | 0 refills | Status: DC
  Filled 2023-11-14: qty 2, 4d supply, fill #0

## 2023-11-14 NOTE — Telephone Encounter (Signed)
Pt sent mychart , routed to PCP    Copied from CRM [id number]. Topic: Clinical - Medication Question >> Nov 14, 2023  8:17 AM Deaijah H wrote: Reason for CRM: Patient called in to speak with Robin regarding her medication amoxicillin-clavulanate (AUGMENTIN) 875-125 MG tablet. / Please call [phone]

## 2023-11-14 NOTE — Telephone Encounter (Signed)
Please see the MyChart message reply(ies) for my assessment and plan.  The patient gave consent for this Medical Advice Message and is aware that it may result in a bill to their insurance company as well as the possibility that this may result in a co-payment or deductible. They are an established patient, but are not seeking medical advice exclusively about a problem treated during an in person or video visit in the last 7 days. I did not recommend an in person or video visit within 7 days of my reply.  I spent a total of 5 minutes cumulative time within 7 days through MyChart messaging Zuleyka Kloc Paul Shianne Zeiser, DO  

## 2023-12-10 ENCOUNTER — Other Ambulatory Visit: Payer: Self-pay

## 2023-12-19 ENCOUNTER — Other Ambulatory Visit (HOSPITAL_COMMUNITY): Payer: Self-pay

## 2023-12-19 ENCOUNTER — Other Ambulatory Visit: Payer: Self-pay

## 2024-01-15 ENCOUNTER — Other Ambulatory Visit: Payer: Self-pay | Admitting: Family Medicine

## 2024-01-15 DIAGNOSIS — R109 Unspecified abdominal pain: Secondary | ICD-10-CM

## 2024-01-17 ENCOUNTER — Other Ambulatory Visit: Payer: Self-pay

## 2024-01-17 ENCOUNTER — Other Ambulatory Visit: Payer: Self-pay | Admitting: Family Medicine

## 2024-01-17 ENCOUNTER — Other Ambulatory Visit (HOSPITAL_BASED_OUTPATIENT_CLINIC_OR_DEPARTMENT_OTHER): Payer: Self-pay

## 2024-01-17 ENCOUNTER — Other Ambulatory Visit (HOSPITAL_COMMUNITY): Payer: Self-pay

## 2024-01-17 DIAGNOSIS — J449 Chronic obstructive pulmonary disease, unspecified: Secondary | ICD-10-CM

## 2024-01-17 MED ORDER — BREZTRI AEROSPHERE 160-9-4.8 MCG/ACT IN AERO
INHALATION_SPRAY | RESPIRATORY_TRACT | 0 refills | Status: DC
Start: 2024-01-17 — End: 2024-03-17
  Filled 2024-01-17 (×2): qty 10.7, 30d supply, fill #0

## 2024-01-17 MED ORDER — MODAFINIL 200 MG PO TABS
200.0000 mg | ORAL_TABLET | Freq: Every morning | ORAL | 0 refills | Status: DC
  Filled 2024-01-17: qty 30, 30d supply, fill #0

## 2024-01-17 NOTE — Telephone Encounter (Signed)
 Requesting: Provigil  Contract: n/a UDS: n/a Last Visit: 08/09/23 Next Visit: None Last Refill: 07/26/23 #30 and 0RF   Please Advise

## 2024-01-23 ENCOUNTER — Encounter: Payer: Self-pay | Admitting: Family Medicine

## 2024-01-23 DIAGNOSIS — J029 Acute pharyngitis, unspecified: Secondary | ICD-10-CM

## 2024-01-25 DIAGNOSIS — J029 Acute pharyngitis, unspecified: Secondary | ICD-10-CM

## 2024-01-25 MED ORDER — AMOXICILLIN-POT CLAVULANATE 875-125 MG PO TABS: 1.0000 | ORAL_TABLET | Freq: Two times a day (BID) | ORAL | 0 refills | Status: DC

## 2024-01-25 NOTE — Telephone Encounter (Signed)

## 2024-01-27 ENCOUNTER — Other Ambulatory Visit (HOSPITAL_BASED_OUTPATIENT_CLINIC_OR_DEPARTMENT_OTHER): Payer: Self-pay

## 2024-02-06 ENCOUNTER — Other Ambulatory Visit (HOSPITAL_COMMUNITY): Payer: Self-pay

## 2024-02-06 ENCOUNTER — Other Ambulatory Visit: Payer: Self-pay | Admitting: Family Medicine

## 2024-02-06 DIAGNOSIS — E7849 Other hyperlipidemia: Secondary | ICD-10-CM

## 2024-02-06 DIAGNOSIS — J449 Chronic obstructive pulmonary disease, unspecified: Secondary | ICD-10-CM

## 2024-02-07 ENCOUNTER — Other Ambulatory Visit (HOSPITAL_COMMUNITY): Payer: Self-pay

## 2024-02-07 ENCOUNTER — Other Ambulatory Visit: Payer: Self-pay | Admitting: Family Medicine

## 2024-02-07 ENCOUNTER — Other Ambulatory Visit: Payer: Self-pay

## 2024-02-07 DIAGNOSIS — E7849 Other hyperlipidemia: Secondary | ICD-10-CM

## 2024-02-07 MED ORDER — ATORVASTATIN CALCIUM 20 MG PO TABS
20.0000 mg | ORAL_TABLET | Freq: Every day | ORAL | 2 refills | Status: DC
Start: 2024-02-07 — End: 2024-10-07
  Filled 2024-02-07: qty 90, 90d supply, fill #0
  Filled 2024-03-19 – 2024-05-19 (×3): qty 90, 90d supply, fill #1
  Filled 2024-08-13: qty 90, 90d supply, fill #2

## 2024-02-22 ENCOUNTER — Other Ambulatory Visit: Payer: Self-pay | Admitting: Family Medicine

## 2024-02-24 ENCOUNTER — Encounter: Payer: Self-pay | Admitting: Family Medicine

## 2024-02-24 ENCOUNTER — Other Ambulatory Visit (HOSPITAL_BASED_OUTPATIENT_CLINIC_OR_DEPARTMENT_OTHER): Payer: Self-pay

## 2024-02-24 MED ORDER — ALBUTEROL SULFATE HFA 108 (90 BASE) MCG/ACT IN AERS: 1.0000 | INHALATION_SPRAY | Freq: Four times a day (QID) | RESPIRATORY_TRACT | 5 refills | Status: DC | PRN

## 2024-02-25 ENCOUNTER — Other Ambulatory Visit: Payer: Self-pay | Admitting: Family Medicine

## 2024-02-25 ENCOUNTER — Other Ambulatory Visit (HOSPITAL_BASED_OUTPATIENT_CLINIC_OR_DEPARTMENT_OTHER): Payer: Self-pay

## 2024-02-25 DIAGNOSIS — J449 Chronic obstructive pulmonary disease, unspecified: Secondary | ICD-10-CM

## 2024-02-27 ENCOUNTER — Encounter: Payer: Self-pay | Admitting: Family Medicine

## 2024-02-27 ENCOUNTER — Other Ambulatory Visit (HOSPITAL_BASED_OUTPATIENT_CLINIC_OR_DEPARTMENT_OTHER): Payer: Self-pay | Admitting: Family Medicine

## 2024-02-27 DIAGNOSIS — Z139 Encounter for screening, unspecified: Secondary | ICD-10-CM

## 2024-03-16 ENCOUNTER — Other Ambulatory Visit: Payer: Self-pay | Admitting: Family Medicine

## 2024-03-17 ENCOUNTER — Other Ambulatory Visit: Payer: Self-pay | Admitting: Family Medicine

## 2024-03-17 DIAGNOSIS — J449 Chronic obstructive pulmonary disease, unspecified: Secondary | ICD-10-CM

## 2024-03-17 MED ORDER — MODAFINIL 200 MG PO TABS
200.0000 mg | ORAL_TABLET | Freq: Every morning | ORAL | 0 refills | Status: DC
  Filled 2024-03-17 – 2024-03-25 (×3): qty 30, 30d supply, fill #0

## 2024-03-18 ENCOUNTER — Other Ambulatory Visit (HOSPITAL_COMMUNITY): Payer: Self-pay

## 2024-03-18 ENCOUNTER — Other Ambulatory Visit (HOSPITAL_BASED_OUTPATIENT_CLINIC_OR_DEPARTMENT_OTHER): Payer: Self-pay

## 2024-03-18 ENCOUNTER — Other Ambulatory Visit: Payer: Self-pay

## 2024-03-18 MED ORDER — BREZTRI AEROSPHERE 160-9-4.8 MCG/ACT IN AERO
INHALATION_SPRAY | RESPIRATORY_TRACT | 0 refills | Status: DC
Start: 2024-03-18 — End: 2024-04-21
  Filled 2024-03-18 – 2024-03-25 (×2): qty 10.7, 30d supply, fill #0

## 2024-03-20 ENCOUNTER — Other Ambulatory Visit (HOSPITAL_COMMUNITY): Payer: Self-pay

## 2024-03-24 ENCOUNTER — Other Ambulatory Visit (HOSPITAL_COMMUNITY): Payer: Self-pay

## 2024-03-25 ENCOUNTER — Other Ambulatory Visit (HOSPITAL_BASED_OUTPATIENT_CLINIC_OR_DEPARTMENT_OTHER): Payer: Self-pay

## 2024-03-25 ENCOUNTER — Other Ambulatory Visit: Payer: Self-pay

## 2024-03-26 ENCOUNTER — Encounter (INDEPENDENT_AMBULATORY_CARE_PROVIDER_SITE_OTHER): Payer: Self-pay | Admitting: Otolaryngology

## 2024-03-26 ENCOUNTER — Other Ambulatory Visit (HOSPITAL_BASED_OUTPATIENT_CLINIC_OR_DEPARTMENT_OTHER): Payer: Self-pay

## 2024-03-26 ENCOUNTER — Ambulatory Visit (INDEPENDENT_AMBULATORY_CARE_PROVIDER_SITE_OTHER): Payer: Self-pay | Admitting: Otolaryngology

## 2024-03-26 VITALS — BP 170/133 | HR 94 | Ht 64.0 in | Wt 290.0 lb

## 2024-03-26 DIAGNOSIS — Z87891 Personal history of nicotine dependence: Secondary | ICD-10-CM | POA: Diagnosis not present

## 2024-03-26 DIAGNOSIS — J351 Hypertrophy of tonsils: Secondary | ICD-10-CM

## 2024-03-26 DIAGNOSIS — E038 Other specified hypothyroidism: Secondary | ICD-10-CM

## 2024-03-26 DIAGNOSIS — J392 Other diseases of pharynx: Secondary | ICD-10-CM

## 2024-03-26 DIAGNOSIS — J312 Chronic pharyngitis: Secondary | ICD-10-CM | POA: Diagnosis not present

## 2024-03-26 DIAGNOSIS — J36 Peritonsillar abscess: Secondary | ICD-10-CM | POA: Diagnosis not present

## 2024-03-26 DIAGNOSIS — K219 Gastro-esophageal reflux disease without esophagitis: Secondary | ICD-10-CM

## 2024-03-26 DIAGNOSIS — Q892 Congenital malformations of other endocrine glands: Secondary | ICD-10-CM

## 2024-03-26 DIAGNOSIS — F17200 Nicotine dependence, unspecified, uncomplicated: Secondary | ICD-10-CM

## 2024-03-26 DIAGNOSIS — R0982 Postnasal drip: Secondary | ICD-10-CM

## 2024-03-26 MED ORDER — LEVOCETIRIZINE DIHYDROCHLORIDE 5 MG PO TABS
5.0000 mg | ORAL_TABLET | Freq: Every evening | ORAL | 3 refills | Status: DC
  Filled 2024-03-26: qty 30, 30d supply, fill #0
  Filled 2024-04-21: qty 30, 30d supply, fill #1
  Filled 2024-06-01: qty 30, 30d supply, fill #2
  Filled 2024-06-17 – 2024-07-08 (×3): qty 30, 30d supply, fill #3

## 2024-03-26 MED ORDER — FLUTICASONE PROPIONATE 50 MCG/ACT NA SUSP
2.0000 | Freq: Two times a day (BID) | NASAL | 6 refills | Status: AC
  Filled 2024-03-26: qty 16, 30d supply, fill #0
  Filled 2024-04-22: qty 16, 30d supply, fill #1
  Filled 2024-06-01: qty 16, 30d supply, fill #2
  Filled 2024-07-16: qty 16, 30d supply, fill #3
  Filled 2024-09-07: qty 16, 30d supply, fill #4
  Filled 2024-10-07: qty 16, 30d supply, fill #5
  Filled 2024-11-29: qty 16, 30d supply, fill #6

## 2024-03-26 NOTE — Progress Notes (Signed)
 ENT CONSULT:  Reason for Consult: chronic sore throat  after an episode of tonsillar abscess 04/2023  HPI: Discussed the use of AI scribe software for clinical note transcription with the patient, who gave verbal consent to proceed.  History of Present Illness Pamela [name] is a 63 year old female former smoker, quit smoking 02/25/24, hx of COPD, hx of BMI of 50, GERD, hx of gastric lap band surgery, hx of hiatal hernia, who is here for recurrent throat discomfort. She was referred by her primary care physician for follow-up due to recurrent throat discomfort, requiring multiple courses of abx.   She has a hx of admission for severe throat pain in June 2024, where an abscess was identified in her right tonsil. The abscess did not require admission 2/2 being small. During that admission, an ENT doctor noted a nonspecific lesion along the base of her tongue on CT neck. Since then, she has experienced recurrent throat pain approximately every three months, managed with antibiotics. The pain is not as severe as during her hospitalization but is described as discomfort primarily on the right side of her throat. She has not required emergency care since the initial hospitalization.  She completed her last course of antibiotics in January, approximately three to four months ago. Antibiotics have been prescribed every three months, but her primary care physician is hesitant to continue this regimen without further evaluation. She quit smoking on April 1st and is currently using a nicotine patch, with support from a coach through [employer].  She has a history of heartburn and is currently taking pantoprazole  daily. 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 She has been taking Synthroid  for an underactive thyroid  for 25-30 years and denies any history of thyroid  nodules.  No difficulty  swallowing or eating, and no recent tonsillar swelling or abscess formation. 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 She associates the onset of her throat pain with the recurrence of her reflux symptoms.  Records Reviewed:  05/05/23 D/c summary  63 year old F with PMH of COPD, OSA on CPAP, morbid obesity, anxiety, depression, HLD and hypothyroidism presenting with progressive right neck pain and swelling with odynophagia and voice change 4 days and admitted for right tonsillar abscess and epiglottitis.  Hemodynamically stable.  Leukocytosis to 14.8.  CT soft tissue neck showed 1.4 x 0.8 cm low-density fluid collection in the right palatine tonsil worrisome for intratonsillar abscess, soft tissue stranding in the right parapharyngeal space concerning for epiglottitis and possible right submandibular sialoadenitis and nonspecific tongue base lesion measuring 1.7 x 1.7 x 2.3 cm.  ED provider discussed CT finding with ENT.  The later recommended conservative management with IV antibiotics send the abscess is small.  Patient was started on Unasyn  and admitted.  Received Decadron  and Toradol  in ED.   The next day, formally evaluated by ENT, Dr. Virgia Griffins.  Pain improved after resuming Toradol . On the day of discharge, patient felt much better.  Able to open her mouth and a soft diet.  Full range of motion.  Felt well and ready to go home.  Cleared for discharge by ENT on p.o. Augmentin .  She is discharged on Augmentin  for 5 more days to complete treatment course.  Also prescribed chlorhexidine  mouth rinse.  Can use Aleve or  naproxen as needed for the next 3 to 4 days.  Right palatine tonsillar abscess/epiglottitis/sialoadenitis: Presents with progressive right neck pain, swelling, odynophagia and voice change.  Treated with Zithromax  and lidocaine  outpatient without significant improvement.  CT finding as  above.  Improved with IV Unasyn  and Toradol . -Cleared for discharge by ENT.   -Discharged on p.o. Augmentin  for 5 more days to complete course. -Aleve or ibuprofen as needed for pain -Outpatient follow-up with ENT as needed -Chlorhexidine  mouthwash   Dr Virgia Griffins consult note 05/04/23 Assessment/Plan: Right peritonsillar abscess-it is small and she today is actually doing significantly better albeit that may be from finally getting her pain medication correct.  We discussed the options of incision and drainage versus continued medical therapy.  She elects continued medical therapy.  I will check her again tomorrow and see how we have progressed.  She does have a enhancing mass at the base of tongue which is in the location of ectopic thyroid  tissue.  This will need further workup as an outpatient.    Past Medical History:  Diagnosis Date   Anemia    Asthma    Colon polyps    COPD (chronic obstructive pulmonary disease) (HCC)    Depression    GERD (gastroesophageal reflux disease)    Heart murmur    childhood   History of alcohol abuse    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)   History of blood transfusion 2006   HLD (hyperlipidemia)    Hypothyroidism    Obesity    Sleep apnea    cpap   Thyroid  disease     Past Surgical History:  Procedure Laterality Date   AUGMENTATION MAMMAPLASTY Bilateral 1987   Saline   BREAST BIOPSY     INSERTION OF MESH N/A 07/16/2019   Procedure: Insertion Of Mesh;  Surgeon: Oza Blumenthal, MD;  Location: [facility] OR;  Service: General;  Laterality: N/A;   LAPAROSCOPIC GASTRIC BANDING  2010   VENTRAL HERNIA REPAIR N/A 07/16/2019   Procedure: VENTRAL INCISIONAL HERNIA REPAIR WITH MESH;  Surgeon: Oza Blumenthal, MD;  Location: [facility] OR;  Service: General;  Laterality: N/A;   XI ROBOTIC ASSISTED VENTRAL HERNIA N/A 03/19/2023   Procedure: OPEN PRIMARY CLOSURE OF RECURRENT INCISIONAL HERNIA AND REMOVAL OF INTRAPERITONEAL MESH;  Surgeon: Junie Olds, MD;   Location: [facility] ORS;  Service: General;  Laterality: N/A;    Family History  Problem Relation Age of Onset   Alcohol abuse Mother    Depression Mother    Early death Mother    Liver disease Mother    Alcohol abuse Father    Obesity Father    Cancer Father     Social History:  reports that she has been smoking cigarettes. She has a 7.5 pack-year smoking history. She has never used smokeless tobacco. She reports that she does not drink alcohol and does not use drugs.  Allergies: No Known Allergies  Medications: I have reviewed the patient's current medications.  The PMH, PSH, Medications, Allergies, and SH were reviewed and updated.  ROS: Constitutional: Negative for fever, weight loss and weight gain. Cardiovascular: Negative for chest pain and dyspnea on exertion. Respiratory: Is not experiencing shortness of breath at rest. Gastrointestinal: Negative for nausea and vomiting. Neurological: Negative for headaches. Psychiatric: The patient is not nervous/anxious  Blood pressure (!) 170/133, pulse 94, height 5\' 4"  (1.626 m), weight 290 lb (131.5 kg), SpO2 93%. Body mass index is 49.78 kg/m.  PHYSICAL EXAM:  Exam: General: Well-developed, well-nourished  Respiratory Respiratory effort: Equal inspiration and expiration without stridor Cardiovascular Peripheral Vascular: Warm extremities with equal color/perfusion Eyes: No nystagmus with equal extraocular motion bilaterally Neuro/Psych/Balance: Patient oriented to person, place, and time; Appropriate mood and affect; Gait is intact with no imbalance; Cranial nerves I-XII are intact Head and Face Inspection: Normocephalic and atraumatic without mass or lesion Palpation: Facial skeleton intact without bony stepoffs Salivary Glands: No mass or tenderness Facial Strength: Facial motility symmetric and full bilaterally ENT Pinna: External ear intact and fully developed External canal: Canal is patent with intact skin Tympanic  Membrane: Clear and mobile External Nose: No scar or anatomic deformity Internal Nose: Septum is relatively straight. No polyp, or purulence. Mucosal edema and erythema present.  Bilateral inferior turbinate hypertrophy.  Lips, Teeth, and gums: Mucosa and teeth intact and viable TMJ: No pain to palpation with full mobility Oral cavity/oropharynx: No erythema or exudate, no lesions present tonsils 1+ without exudate or erythema Base of the tongue with a large round mass which appears to have surface blood vessels, and has smooth edges without exophytic friable mucosa Nasopharynx: No mass or lesion with intact mucosa Hypopharynx: Intact mucosa without pooling of secretions Larynx Glottic: Full true vocal cord mobility without lesion or mass Supraglottic: Normal appearing epiglottis and AE folds Interarytenoid Space: Moderate pachydermia&edema Subglottic Space: Patent without lesion or edema Neck Neck and Trachea: Midline trachea without mass or lesion Thyroid : No mass or nodularity Lymphatics: No lymphadenopathy  Procedure: Preoperative diagnosis: recurrent throat discomfort hx of base of the tongue on CT neck   Postoperative diagnosis:   Same + base of the tongue mass, GERD LPR  Procedure: Flexible fiberoptic laryngoscopy  Surgeon: Artice Last, MD  Anesthesia: Topical lidocaine  and Afrin Complications: None Condition is stable throughout exam  Indications and consent:  The patient presents to the clinic with above symptoms. Indirect laryngoscopy view was incomplete. Thus it was recommended that they undergo a flexible fiberoptic laryngoscopy. All of the risks, benefits, and potential complications were reviewed with the patient preoperatively and verbal informed consent was obtained.  Procedure: The patient was seated upright in the clinic. Topical lidocaine  and Afrin were applied to the nasal cavity. After adequate anesthesia had occurred, I then proceeded to pass the flexible  telescope into the nasal cavity. The nasal cavity was patent without rhinorrhea or polyp. The nasopharynx was also patent without mass or lesion. The base of tongue was visualized and was normal. There were no signs of pooling of secretions in the piriform sinuses. The true vocal folds were mobile bilaterally. There were no signs of glottic or supraglottic mucosal lesion or mass. There was moderate interarytenoid pachydermia and post cricoid edema. The telescope was then slowly withdrawn and the patient tolerated the procedure throughout.    Studies Reviewed: CT neck w/contrast 05/03/2023 FINDINGS: Pharynx and larynx: There is diffuse tonsillar hypertrophy with a 1.4 x 0.9 cm low-density fluid collection in the right palatine tonsils, worrisome for a intra tonsillar abscess. There is soft tissue stranding in the right parapharyngeal space and along the right pharyngeal mucosa to the level of the epiglottis. The right aspect of the epiglottis is asymmetrically thickened, which can be seen in the setting of epiglottitis.   Salivary glands: The right submandibular gland is asymmetrically enlarged with mild surrounding fat stranding. No mass, or stone.   Thyroid : Normal thyroid  tissue is not visualized at the expected location of the thyroid  gland. There is a hyperdense lesion of the base of the tongue (series 3, image  41) measuring 1.7 x 1.7 x 2.3 cm. This could represent a contrast-enhancing mass or ectopic thyroid  tissue. Recommend further evaluation with a nuclear medicine thyroid  uptake scan.   Lymph nodes: None enlarged or abnormal density.   Vascular: Negative.   Limited intracranial: Negative.   Visualized orbits: Negative.   Mastoids and visualized paranasal sinuses: Clear.   Skeleton: No acute or aggressive process.   Upper chest: Negative.   Other: There is subcutaneous soft tissue stranding in the Peri and submandibular soft tissues on the right (series 3, image 45)    IMPRESSION: 1. Diffuse tonsillar hypertrophy with a 1.4 x 0.9 cm low-density fluid collection in the right palatine tonsils, worrisome for an intra tonsillar abscess. 2. Soft tissue stranding in the right parapharyngeal space and along the right pharyngeal mucosa to the level of the epiglottis, which can be seen in the setting of epiglottitis. 3. Asymmetric enlargement of the right submandibular gland with mild surrounding fat stranding, which can be seen in the setting of sialoadenitis. 4. Midline hyperdense lesion at the base of the tongue measuring 1.7 x 1.7 x 2.3 cm. This could represent a contrast-enhancing mass or ectopic thyroid  tissue. Recommend further evaluation with a nuclear medicine thyroid  uptake scan.     Assessment/Plan: Encounter Diagnoses  Name Primary?   Tobacco use disorder Yes   Chronic sore throat    Tonsillar abscess    Ectopic thyroid  tissue    Other specified hypothyroidism    Tonsillar hypertrophy    Chronic GERD    Oropharyngeal mass     Assessment and Plan Assessment & Plan Chronic sore throat Intermittent right-sided throat discomfort, improved post-smoking cessation. Possible causes: chronic throat irritation from post-nasal drainage, GERD, LPR or due to presence of base of the tongue mass which appears to be c/w ectopic thyroid  tissue based on imaging (CT neck). No evidence of tonsillitis or pharyngitis today. She had 1+ symmetric tonsillar hypertrophy w/o exudate or erythema - Prescribe Xyzal  and Flonase  for postnasal drainage. - Advise daily salt water  gargles for two months. - Recommend continued smoking cessation. - Provide information on reflux management.  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  We discussed that imaging and exam today including flexible laryngoscopy support the notion that the mass is non-cancerous. Removal can be considered if significant symptoms occur. Her sx are mild currently and she denies dysphagia or odynophagia. Already on 150 mcg of Synthroid  daily for hypothyroidism.  - Order MRI neck w/con to monitor base of tongue lesion in terms of changes in size since CT neck was done approximately 1 year ago.  Gastroesophageal reflux disease (GERD) LPR GERD managed with pantoprazole . Reflux may contribute to throat discomfort. - Continue pantoprazole . - Advise use of complementary product to reduce reflux, Reflux Gourmet, after meals . - Provide information on reflux management.  History of smoking Former smoker, cessation improved throat symptoms. - Encourage continued smoking cessation.  MRI neck w/con RTC 2 mo   Thank you for allowing me to participate in the care of this patient. Please do not hesitate to contact me with any questions or concerns.   Artice Last, MD Otolaryngology [facility] Phone: [phone] Fax: [phone]    03/26/2024, 9:54 AM

## 2024-03-26 NOTE — Patient Instructions (Addendum)
 GamingLesson.nl - check out this website to learn more about reflux   -Avoid lying down for at least two hours after a meal or after drinking acidic beverages, like soda, or other caffeinated beverages. This can help to prevent stomach contents from flowing back into the esophagus. -Keep your head elevated while you sleep. Using an extra pillow or two can also help to prevent reflux. -Eat smaller and more frequent meals each day instead of a few large meals. This promotes digestion and can aid in preventing heartburn. -Wear loose-fitting clothes to ease pressure on the stomach, which can worsen heartburn and reflux. -Reduce excess weight around the midsection. This can ease pressure on the stomach. Such pressure can force some stomach contents back up the esophagus - Take Reflux Gourmet (natural supplement available on Amazon) to help with symptoms of chronic throat irritation      Dear Pamela [name],   Congratulations for your interest in quitting smoking!  Find a program that suits you best: when you want to quit, how you need support, where you live, and how you like to learn.    If you're ready to get started TODAY, consider scheduling a visit through [email or web address].  Appointments are available from 8am to 8pm, Monday to Friday.   Most health insurance plans will cover some level of tobacco cessation visits and medications.    Additional Resources: [employer] are also available to help you quit & provide the support you'll need. Many programs are available in both [place] and Spanish and have a long history of successfully helping people get off and stay off tobacco.    Quit Smoking Apps:  quitSTART at [employer].de QuitGuide?at ForgetParking.dk Online education and resources: Smokefree  at [employer].gov Free Telephone Coaching: QuitNow,  Call 1-800-QUIT-NOW (317-597-9417) or Text- Ready to 3304409835  *Quitline [place] has teamed up with Medicaid to offer a free 14 week program    Vaping- Want to Quit? Free 24/7 support. Call [facility]  [place], [place], [place], [place], [place]  [facility]

## 2024-03-30 ENCOUNTER — Ambulatory Visit (INDEPENDENT_AMBULATORY_CARE_PROVIDER_SITE_OTHER): Payer: Self-pay | Admitting: Family Medicine

## 2024-03-30 ENCOUNTER — Ambulatory Visit (HOSPITAL_BASED_OUTPATIENT_CLINIC_OR_DEPARTMENT_OTHER)
Admission: RE | Admit: 2024-03-30 | Discharge: 2024-03-30 | Disposition: A | Discharge status: Home / Self Care | Payer: Self-pay | Source: Ambulatory Visit | Attending: Family Medicine | Admitting: Family Medicine

## 2024-03-30 ENCOUNTER — Encounter: Payer: Self-pay | Admitting: Family Medicine

## 2024-03-30 ENCOUNTER — Encounter (HOSPITAL_BASED_OUTPATIENT_CLINIC_OR_DEPARTMENT_OTHER): Payer: Self-pay

## 2024-03-30 VITALS — BP 132/84 | HR 91 | Temp 98.0°F | Resp 16 | Ht 64.0 in | Wt 289.0 lb

## 2024-03-30 DIAGNOSIS — Z0000 Encounter for general adult medical examination without abnormal findings: Secondary | ICD-10-CM | POA: Diagnosis not present

## 2024-03-30 DIAGNOSIS — R928 Other abnormal and inconclusive findings on diagnostic imaging of breast: Secondary | ICD-10-CM | POA: Diagnosis not present

## 2024-03-30 DIAGNOSIS — Z1211 Encounter for screening for malignant neoplasm of colon: Secondary | ICD-10-CM

## 2024-03-30 DIAGNOSIS — Z139 Encounter for screening, unspecified: Secondary | ICD-10-CM

## 2024-03-30 DIAGNOSIS — H9193 Unspecified hearing loss, bilateral: Secondary | ICD-10-CM | POA: Diagnosis not present

## 2024-03-30 DIAGNOSIS — Z1231 Encounter for screening mammogram for malignant neoplasm of breast: Secondary | ICD-10-CM | POA: Diagnosis not present

## 2024-03-30 LAB — COMPREHENSIVE METABOLIC PANEL WITH GFR
ALT: 17 U/L (ref 0–35)
AST: 17 U/L (ref 0–37)
Albumin: 4.2 g/dL (ref 3.5–5.2)
Alkaline Phosphatase: 108 U/L (ref 39–117)
BUN: 12 mg/dL (ref 6–23)
CO2: 31 meq/L (ref 19–32)
Calcium: 9.4 mg/dL (ref 8.4–10.5)
Chloride: 101 meq/L (ref 96–112)
Creatinine, Ser: 0.55 mg/dL (ref 0.40–1.20)
GFR: 97.31 mL/min (ref >60.00)
Glucose, Bld: 105 mg/dL — ABNORMAL HIGH (ref 70–99)
Potassium: 4.3 meq/L (ref 3.5–5.1)
Sodium: 140 meq/L (ref 135–145)
Total Bilirubin: 0.5 mg/dL (ref 0.2–1.2)
Total Protein: 6.9 g/dL (ref 6.0–8.3)

## 2024-03-30 LAB — CBC
HCT: 43.5 % (ref 36.0–46.0)
Hemoglobin: 14.4 g/dL (ref 12.0–15.0)
MCHC: 33.1 g/dL (ref 30.0–36.0)
MCV: 88 fl (ref 78.0–100.0)
Platelets: 376 10*3/uL (ref 150.0–400.0)
RBC: 4.95 Mil/uL (ref 3.87–5.11)
RDW: 14.8 % (ref 11.5–15.5)
WBC: 7.2 10*3/uL (ref 4.0–10.5)

## 2024-03-30 LAB — LIPID PANEL
Cholesterol: 199 mg/dL (ref 0–200)
HDL: 67.4 mg/dL (ref >39.00)
LDL Cholesterol: 114 mg/dL — ABNORMAL HIGH (ref 0–99)
NonHDL: 132.06
Total CHOL/HDL Ratio: 3
Triglycerides: 89 mg/dL (ref 0.0–149.0)
VLDL: 17.8 mg/dL (ref 0.0–40.0)

## 2024-03-30 NOTE — Progress Notes (Signed)
 Chief Complaint  Patient presents with   Annual Exam    CPE     Well Woman Pamela [name] is here for a complete physical.   Her last physical was >1 year ago.  Current diet: in general, a "healthy" diet. Current exercise: swimming. Weight is increasing and she denies fatigue out of ordinary. No LMP recorded. Patient is postmenopausal.  Seatbelt? Yes Advanced directive? No  Health Maintenance Pap/HPV- Yes Mammogram- Yes Colon cancer screening-Due Shingrix- No Tetanus- Yes Hep C screening- Yes HIV screening- Yes  Past Medical History:  Diagnosis Date   Anemia    Asthma    Colon polyps    COPD (chronic obstructive pulmonary disease) (HCC)    Depression    GERD (gastroesophageal reflux disease)    Heart murmur    childhood   History of alcohol abuse    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)   History of blood transfusion 2006   HLD (hyperlipidemia)    Hypothyroidism    Obesity    Sleep apnea    cpap   Thyroid  disease      Past Surgical History:  Procedure Laterality Date   AUGMENTATION MAMMAPLASTY Bilateral 1987   Saline   BREAST BIOPSY     INSERTION OF MESH N/A 07/16/2019   Procedure: Insertion Of Mesh;  Surgeon: Oza Blumenthal, MD;  Location: [facility] OR;  Service: General;  Laterality: N/A;   LAPAROSCOPIC GASTRIC BANDING  2010   VENTRAL HERNIA REPAIR N/A 07/16/2019   Procedure: VENTRAL INCISIONAL HERNIA REPAIR WITH MESH;  Surgeon: Oza Blumenthal, MD;  Location: [facility] OR;  Service: General;  Laterality: N/A;   XI ROBOTIC ASSISTED VENTRAL HERNIA N/A 03/19/2023   Procedure: OPEN PRIMARY CLOSURE OF RECURRENT INCISIONAL HERNIA AND REMOVAL OF INTRAPERITONEAL MESH;  Surgeon: Junie Olds, MD;  Location: [facility] ORS;  Service: General;  Laterality: N/A;    Medications  Current Outpatient Medications on File Prior to Visit  Medication Sig Dispense Refill   acetaminophen  (TYLENOL ) 500 MG tablet Take 1,000 mg by mouth daily as needed for moderate pain.     albuterol   (VENTOLIN  HFA) 108 (90 Base) MCG/ACT inhaler Inhale 1-2 puffs into the lungs every 6 (six) hours as needed for wheezing or shortness of breath. 6.7 g 5   amitriptyline  (ELAVIL ) 10 MG tablet Take 1 tablet (10 mg total) by mouth at bedtime. 90 tablet 3   aspirin  EC 81 MG tablet Take 81 mg by mouth daily. Swallow whole.     atorvastatin  (LIPITOR) 20 MG tablet Take 1 tablet (20 mg total) by mouth daily. 90 tablet 2   budeson-glycopyrrolate -formoterol  (BREZTRI  AEROSPHERE) 160-9-4.8 MCG/ACT AERO inhaler Inhale 2 puffs by mouth twice daily. Rinse mouth out after use. 10.7 g 0   chlorhexidine  (PERIDEX ) 0.12 % solution Use as directed 15 mLs in the mouth or throat 2 (two) times daily. 473 mL 0   clobetasol  (TEMOVATE ) 0.05 % external solution Apply 1 Application topically 2 (two) times daily as needed (irritation). 50 mL 1   dicyclomine  (BENTYL ) 10 MG capsule Take 1 capsule (10 mg total) by mouth every 6 (six) hours as needed for spasms. 30 capsule 1   fluconazole  (DIFLUCAN ) 150 MG tablet Take 1 tab, repeat in 72 hours if no improvement. 2 tablet 0   fluticasone  (FLONASE ) 50 MCG/ACT nasal spray Place 2 sprays into both nostrils 2 (two) times daily. 16 g 6   ibuprofen (ADVIL) 200 MG tablet Take 600-800 mg by mouth every 6 (six) hours  as needed for moderate pain.     levocetirizine (XYZAL  ALLERGY 24HR) 5 MG tablet Take 1 tablet (5 mg total) by mouth every evening. 30 tablet 3   levothyroxine  (SYNTHROID ) 150 MCG tablet Take 1 tablet (150 mcg total) by mouth daily. 90 tablet 2   Melatonin 10 MG CAPS Take 10 mg by mouth at bedtime.     modafinil  (PROVIGIL ) 200 MG tablet Take 1 tablet (200 mg total) by mouth in the morning. 30 tablet 0   Multiple Vitamin (MULTIVITAMIN WITH MINERALS) TABS tablet Take 1 tablet by mouth daily.     omeprazole  (PRILOSEC) 40 MG capsule TAKE 1 CAPSULE(40 MG) BY MOUTH DAILY AS NEEDED FOR HEARTBURN 90 capsule 1    Allergies No Known Allergies  Review of Systems: Constitutional:  no  unexpected weight changes Eye:  no recent significant change in vision Ear/Nose/Mouth/Throat:  Ears:  + Steady hearing loss Nose/Mouth/Throat:  no complaints of nasal congestion, no sore throat Cardiovascular: no chest pain Respiratory:  no shortness of breath Gastrointestinal:  no abdominal pain, no change in bowel habits GU:  Female: negative for dysuria or pelvic pain Musculoskeletal/Extremities:  no pain of the joints Integumentary (Skin/Breast):  no abnormal skin lesions reported Neurologic:  no headaches Endocrine:  denies fatigue  Exam BP 132/84 (BP Location: Left Arm, Patient Position: Sitting)   Pulse 91   Temp 98 F (36.7 C) (Oral)   Resp 16   Ht 5\' 4"  (1.626 m)   Wt 289 lb (131.1 kg)   SpO2 95%   BMI 49.61 kg/m  General:  well developed, well nourished, in no apparent distress Skin:  no significant moles, warts, or growths Head:  no masses, lesions, or tenderness Eyes:  pupils equal and round, sclera anicteric without injection Ears:  canals without lesions, TMs shiny without retraction, no obvious effusion, no erythema Nose:  nares patent, mucosa normal, and no drainage  Throat/Pharynx:  lips and gingiva without lesion; tongue and uvula midline; non-inflamed pharynx; no exudates or postnasal drainage Neck: neck supple without adenopathy, thyromegaly, or masses Lungs:  clear to auscultation, breath sounds equal bilaterally, no respiratory distress Cardio:  regular rate and rhythm, no LE edema Abdomen:  abdomen soft, nontender; bowel sounds normal; no masses or organomegaly Genital: Defer to GYN Musculoskeletal:  symmetrical muscle groups noted without atrophy or deformity Extremities:  no clubbing, cyanosis, or edema, no deformities, no skin discoloration Neuro:  gait normal; deep tendon reflexes normal and symmetric Psych: well oriented with normal range of affect and appropriate judgment/insight  Assessment and Plan  Well adult exam - Plan: CBC, Comprehensive  metabolic panel with GFR, Lipid panel  Screen for colon cancer - Plan: Ambulatory referral to Gastroenterology  Bilateral hearing loss, unspecified hearing loss type - Plan: Ambulatory referral to Audiology   Well 63 y.o. female. Counseled on diet and exercise. Advanced directive form requested today.  CCS- referral placed Shingrix recommended. Refer to audiology. Other orders as above. Follow up in 6 mo. The patient voiced understanding and agreement to the plan.  Shellie Dials Mulberry, DO 03/30/24 10:06 AM

## 2024-03-30 NOTE — Patient Instructions (Addendum)
Give us 2-3 business days to get the results of your labs back.   Keep the diet clean and stay active.  Please get me a copy of your advanced directive form at your convenience.   If you do not hear anything about your referrals in the next 1-2 weeks, call our office and ask for an update.  Let us know if you need anything.  

## 2024-03-31 ENCOUNTER — Other Ambulatory Visit: Payer: Self-pay

## 2024-03-31 ENCOUNTER — Other Ambulatory Visit (INDEPENDENT_AMBULATORY_CARE_PROVIDER_SITE_OTHER)

## 2024-03-31 ENCOUNTER — Encounter: Payer: Self-pay | Admitting: Family Medicine

## 2024-03-31 DIAGNOSIS — R739 Hyperglycemia, unspecified: Secondary | ICD-10-CM

## 2024-03-31 LAB — HEMOGLOBIN A1C: Hgb A1c MFr Bld: 6.2 % (ref 4.6–6.5)

## 2024-04-01 ENCOUNTER — Other Ambulatory Visit: Payer: Self-pay | Admitting: Family Medicine

## 2024-04-01 DIAGNOSIS — R928 Other abnormal and inconclusive findings on diagnostic imaging of breast: Secondary | ICD-10-CM

## 2024-04-17 ENCOUNTER — Ambulatory Visit
Admission: RE | Admit: 2024-04-17 | Discharge: 2024-04-17 | Disposition: A | Discharge status: Home / Self Care | Source: Ambulatory Visit | Attending: Family Medicine | Admitting: Family Medicine

## 2024-04-17 DIAGNOSIS — R928 Other abnormal and inconclusive findings on diagnostic imaging of breast: Secondary | ICD-10-CM

## 2024-04-21 ENCOUNTER — Other Ambulatory Visit: Payer: Self-pay | Admitting: Family Medicine

## 2024-04-21 ENCOUNTER — Other Ambulatory Visit: Payer: Self-pay | Admitting: Family

## 2024-04-21 ENCOUNTER — Other Ambulatory Visit: Payer: Self-pay

## 2024-04-21 ENCOUNTER — Other Ambulatory Visit (HOSPITAL_COMMUNITY): Payer: Self-pay

## 2024-04-21 DIAGNOSIS — E039 Hypothyroidism, unspecified: Secondary | ICD-10-CM

## 2024-04-21 DIAGNOSIS — J449 Chronic obstructive pulmonary disease, unspecified: Secondary | ICD-10-CM

## 2024-04-21 MED ORDER — BREZTRI AEROSPHERE 160-9-4.8 MCG/ACT IN AERO
INHALATION_SPRAY | RESPIRATORY_TRACT | 3 refills | Status: DC
  Filled 2024-04-21: qty 10.7, 30d supply, fill #0
  Filled 2024-05-19: qty 10.7, 30d supply, fill #1
  Filled 2024-06-17: qty 10.7, 30d supply, fill #2
  Filled 2024-07-16: qty 10.7, 30d supply, fill #3

## 2024-04-22 ENCOUNTER — Other Ambulatory Visit (HOSPITAL_COMMUNITY): Payer: Self-pay

## 2024-04-22 ENCOUNTER — Other Ambulatory Visit: Payer: Self-pay

## 2024-04-27 ENCOUNTER — Encounter: Payer: Self-pay | Admitting: Family Medicine

## 2024-04-27 ENCOUNTER — Other Ambulatory Visit: Payer: Self-pay | Admitting: Family Medicine

## 2024-04-27 ENCOUNTER — Other Ambulatory Visit: Payer: Self-pay

## 2024-04-27 MED ORDER — MODAFINIL 200 MG PO TABS: 200.0000 mg | ORAL_TABLET | Freq: Every morning | ORAL | 0 refills | Status: DC

## 2024-04-27 MED ORDER — MODAFINIL 200 MG PO TABS: 200.0000 mg | ORAL_TABLET | Freq: Every morning | ORAL | 1 refills | Status: DC

## 2024-05-13 ENCOUNTER — Encounter: Payer: Self-pay | Admitting: Family Medicine

## 2024-05-19 ENCOUNTER — Other Ambulatory Visit: Payer: Self-pay

## 2024-05-19 ENCOUNTER — Other Ambulatory Visit: Payer: Self-pay | Admitting: Family Medicine

## 2024-05-27 ENCOUNTER — Encounter (INDEPENDENT_AMBULATORY_CARE_PROVIDER_SITE_OTHER): Payer: Self-pay | Admitting: Otolaryngology

## 2024-06-01 ENCOUNTER — Other Ambulatory Visit (HOSPITAL_COMMUNITY): Payer: Self-pay

## 2024-06-17 ENCOUNTER — Other Ambulatory Visit (HOSPITAL_COMMUNITY): Payer: Self-pay

## 2024-06-17 ENCOUNTER — Other Ambulatory Visit: Payer: Self-pay

## 2024-06-23 ENCOUNTER — Ambulatory Visit (INDEPENDENT_AMBULATORY_CARE_PROVIDER_SITE_OTHER): Admitting: Otolaryngology

## 2024-06-23 ENCOUNTER — Ambulatory Visit: Admitting: Audiologist

## 2024-06-25 ENCOUNTER — Encounter: Payer: Self-pay | Admitting: Family Medicine

## 2024-06-25 DIAGNOSIS — J449 Chronic obstructive pulmonary disease, unspecified: Secondary | ICD-10-CM

## 2024-06-25 DIAGNOSIS — G4733 Obstructive sleep apnea (adult) (pediatric): Secondary | ICD-10-CM

## 2024-07-08 ENCOUNTER — Other Ambulatory Visit (HOSPITAL_COMMUNITY): Payer: Self-pay

## 2024-07-16 ENCOUNTER — Other Ambulatory Visit: Payer: Self-pay

## 2024-07-27 NOTE — Progress Notes (Deleted)
 Pamela [name], female    DOB: [birth year]   MRN: [id number]   Brief patient profile:  ***  *** referred to pulmonary clinic 07/29/2024 by *** for ***      Pt not previously seen by PCCM service.    History of Present Illness  07/29/2024  Pulmonary/ 1st office eval/Dhanya Bogle  No chief complaint on file.    Dyspnea:  *** Cough: *** Sleep: *** SABA use: *** 02 use:*** LDSCT:***  No obvious day to day or daytime pattern/variability or assoc excess/ purulent sputum or mucus plugs or hemoptysis or cp or chest tightness, subjective wheeze or overt sinus or hb symptoms.    Also denies any obvious fluctuation of symptoms with weather or environmental changes or other aggravating or alleviating factors except as outlined above   No unusual exposure hx or h/o childhood pna/ asthma or knowledge of premature birth.  Current Allergies, Complete Past Medical History, Past Surgical History, Family History, and Social History were reviewed in [employer] record.  ROS  The following are not active complaints unless bolded Hoarseness, sore throat, dysphagia, dental problems, itching, sneezing,  nasal congestion or discharge of excess mucus or purulent secretions, ear ache,   fever, chills, sweats, unintended wt loss or wt gain, classically pleuritic or exertional cp,  orthopnea pnd or arm/hand swelling  or leg swelling, presyncope, palpitations, abdominal pain, anorexia, nausea, vomiting, diarrhea  or change in bowel habits or change in bladder habits, change in stools or change in urine, dysuria, hematuria,  rash, arthralgias, visual complaints, headache, numbness, weakness or ataxia or problems with walking or coordination,  change in mood or  memory.             Outpatient Medications Prior to Visit  Medication Sig Dispense Refill   acetaminophen  (TYLENOL ) 500 MG tablet Take 1,000 mg by mouth daily as needed for moderate pain.     albuterol  (VENTOLIN  HFA) 108 (90 Base)  MCG/ACT inhaler Inhale 1-2 puffs into the lungs every 6 (six) hours as needed for wheezing or shortness of breath. 6.7 g 5   amitriptyline  (ELAVIL ) 10 MG tablet Take 1 tablet (10 mg total) by mouth at bedtime. 90 tablet 3   aspirin  EC 81 MG tablet Take 81 mg by mouth daily. Swallow whole.     atorvastatin  (LIPITOR) 20 MG tablet Take 1 tablet (20 mg total) by mouth daily. 90 tablet 2   budesonide -glycopyrrolate -formoterol  (BREZTRI  AEROSPHERE) 160-9-4.8 MCG/ACT AERO inhaler Inhale 2 puffs by mouth twice daily.  Rinse mouth after use 10.7 g 3   chlorhexidine  (PERIDEX ) 0.12 % solution Use as directed 15 mLs in the mouth or throat 2 (two) times daily. 473 mL 0   clobetasol  (TEMOVATE ) 0.05 % external solution Apply 1 Application topically 2 (two) times daily as needed (irritation). 50 mL 1   dicyclomine  (BENTYL ) 10 MG capsule Take 1 capsule (10 mg total) by mouth every 6 (six) hours as needed for spasms. 30 capsule 1   fluticasone  (FLONASE ) 50 MCG/ACT nasal spray Place 2 sprays into both nostrils 2 (two) times daily. 16 g 6   ketoconazole  (NIZORAL ) 2 % cream APPLY TO AREA ON ARM DAILY FOR 2 WEEKS 30 g 1   levocetirizine (XYZAL  ALLERGY 24HR) 5 MG tablet Take 1 tablet (5 mg total) by mouth every evening. 30 tablet 3   levothyroxine  (SYNTHROID ) 150 MCG tablet Take 1 tablet (150 mcg total) by mouth daily before breakfast. 90 tablet 0   Melatonin 10 MG CAPS  Take 10 mg by mouth at bedtime.     modafinil  (PROVIGIL ) 200 MG tablet Take 1 tablet (200 mg total) by mouth in the morning. 90 tablet 1   Multiple Vitamin (MULTIVITAMIN WITH MINERALS) TABS tablet Take 1 tablet by mouth daily.     omeprazole  (PRILOSEC) 40 MG capsule TAKE 1 CAPSULE(40 MG) BY MOUTH DAILY AS NEEDED FOR HEARTBURN 90 capsule 1   No facility-administered medications prior to visit.    Past Medical History:  Diagnosis Date   Anemia    Asthma    Colon polyps    COPD (chronic obstructive pulmonary disease) (HCC)    Depression    GERD  (gastroesophageal reflux disease)    Heart murmur    childhood   History of alcohol abuse    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)   History of blood transfusion 2006   HLD (hyperlipidemia)    Hypothyroidism    Obesity    Sleep apnea    cpap   Thyroid  disease       Objective:     There were no vitals taken for this visit.         Assessment

## 2024-07-28 ENCOUNTER — Telehealth: Payer: Self-pay

## 2024-07-28 NOTE — Telephone Encounter (Signed)
 Called patient to reschedule consult,will send mychart message

## 2024-07-28 NOTE — Telephone Encounter (Signed)
 Already handled patient rescheduled,please keep on waitlist for something sooner with a sleep and pulmonary docotor

## 2024-07-29 ENCOUNTER — Encounter: Payer: Self-pay | Admitting: Audiologist

## 2024-07-29 ENCOUNTER — Ambulatory Visit: Admitting: Internal Medicine

## 2024-07-31 ENCOUNTER — Telehealth (INDEPENDENT_AMBULATORY_CARE_PROVIDER_SITE_OTHER): Payer: Self-pay

## 2024-07-31 ENCOUNTER — Telehealth (INDEPENDENT_AMBULATORY_CARE_PROVIDER_SITE_OTHER): Payer: Self-pay | Admitting: Otolaryngology

## 2024-07-31 NOTE — Telephone Encounter (Signed)
 Called patient and let her know that per radiology everything was authorized for her scan. I let her know that they should be giving her a call soon per my call with them.

## 2024-07-31 NOTE — Telephone Encounter (Signed)
 Patient called regarding need to reschedule appointment. Patient did not know that she needed a Neck MRI. I explained to the patient that it was needed per orders from previous visit. Patient understood. She explained that she never received a call from radiology to schedule appointment. I let her know that I would give radiology a call and let them know to reach out to her to schedule the appointment. Patient was thankful.

## 2024-07-31 NOTE — Telephone Encounter (Signed)
 LVM to cancel 08/03/2024 appt - Imaging not done

## 2024-08-03 ENCOUNTER — Other Ambulatory Visit: Payer: Self-pay | Admitting: Family Medicine

## 2024-08-03 ENCOUNTER — Ambulatory Visit (INDEPENDENT_AMBULATORY_CARE_PROVIDER_SITE_OTHER): Admitting: Otolaryngology

## 2024-08-03 ENCOUNTER — Other Ambulatory Visit: Payer: Self-pay

## 2024-08-03 ENCOUNTER — Other Ambulatory Visit (HOSPITAL_COMMUNITY): Payer: Self-pay

## 2024-08-03 DIAGNOSIS — J449 Chronic obstructive pulmonary disease, unspecified: Secondary | ICD-10-CM

## 2024-08-03 MED ORDER — BREZTRI AEROSPHERE 160-9-4.8 MCG/ACT IN AERO
2.0000 | INHALATION_SPRAY | Freq: Two times a day (BID) | RESPIRATORY_TRACT | 3 refills | Status: DC
  Filled 2024-08-03: qty 10.7, fill #0
  Filled 2024-08-13: qty 10.7, 30d supply, fill #0
  Filled 2024-09-07: qty 10.7, 30d supply, fill #1
  Filled 2024-09-13 – 2024-10-07 (×2): qty 10.7, 30d supply, fill #2
  Filled 2024-10-28: qty 10.7, 30d supply, fill #3

## 2024-08-05 ENCOUNTER — Other Ambulatory Visit: Payer: Self-pay

## 2024-08-05 ENCOUNTER — Other Ambulatory Visit (INDEPENDENT_AMBULATORY_CARE_PROVIDER_SITE_OTHER): Payer: Self-pay | Admitting: Otolaryngology

## 2024-08-05 MED ORDER — LEVOCETIRIZINE DIHYDROCHLORIDE 5 MG PO TABS
5.0000 mg | ORAL_TABLET | Freq: Every evening | ORAL | 3 refills | Status: DC
  Filled 2024-08-05: qty 30, 30d supply, fill #0
  Filled 2024-08-31: qty 30, 30d supply, fill #1
  Filled 2024-09-07 – 2024-10-07 (×2): qty 30, 30d supply, fill #2
  Filled 2024-10-28: qty 30, 30d supply, fill #3

## 2024-08-06 ENCOUNTER — Ambulatory Visit: Admitting: Pulmonary Disease

## 2024-08-06 VITALS — BP 132/87 | HR 96 | Ht 64.0 in | Wt 304.4 lb

## 2024-08-06 DIAGNOSIS — J449 Chronic obstructive pulmonary disease, unspecified: Secondary | ICD-10-CM | POA: Diagnosis not present

## 2024-08-06 DIAGNOSIS — G4733 Obstructive sleep apnea (adult) (pediatric): Secondary | ICD-10-CM

## 2024-08-06 DIAGNOSIS — Z6841 Body Mass Index (BMI) 40.0 and over, adult: Secondary | ICD-10-CM

## 2024-08-06 MED ORDER — ALBUTEROL SULFATE (2.5 MG/3ML) 0.083% IN NEBU: 2.5000 mg | INHALATION_SOLUTION | Freq: Four times a day (QID) | RESPIRATORY_TRACT | 12 refills | Status: DC | PRN

## 2024-08-06 NOTE — Patient Instructions (Signed)
 Schedule for pulmonary function test  CT lung cancer screening  Split-night sleep study  Ambulatory oximetry  DME for nebulizer  Continue using your Breztri   Only use albuterol  rescue sparingly  Weight loss efforts  Referral to  weight management clinic  Follow-up in about 8 to 12 weeks  Call us  with significant concerns

## 2024-08-06 NOTE — Progress Notes (Unsigned)
 Pamela [name]    [id number]    07-12-1961  Primary Care Physician:Wendling, Mabel Mt, DO  Referring Physician: Frann Mabel Mt, DO [address] [place],  [place] [address]  Chief complaint:   Patient with a history of sleep apnea History of obstructive lung disease Shortness of breath with activity Nonrestorative sleep, daytime fatigue okay I guess I 1130 is a no-show  HPI:  Diagnosed with obstructive sleep apnea over 20 years ago Has been using CPAP since Cannot sleep without her CPAP - Most recent machine was procured about 5 years ago - She gets supplies online - She is not aware of our current settings -Does not wake up feeling restored -Admits to dryness of her mouth in the mornings no morning headaches - Occasional night sweats  History of obstructive lung disease - She remembers being told about-COPD up to 20 years ago  Reformed smoker quit smoking in August 2025 -States he was only smoking a few cigarettes a day  Weight gain over 50 pounds in the last year -She tries to stay active, does exercise, swims  History of hypothyroidism - On Synthroid , monitored well  Class III obesity - Did participate in weight management -At some point was on [employer]  but no longer being covered  Does have some anxiety  Has been using Breztri  for COPD, but uses albuterol  many times during the day because of wheezing  History of obstructive sleep apnea being treated with CPAP therapy with persistence of daytime sleepiness - Has been on modafinil  chronically okay morning her daughter document this that Outpatient Encounter Medications as of 08/06/2024  Medication Sig   acetaminophen  (TYLENOL ) 500 MG tablet Take 1,000 mg by mouth daily as needed for moderate pain.   albuterol  (VENTOLIN  HFA) 108 (90 Base) MCG/ACT inhaler Inhale 1-2 puffs into the lungs every 6 (six) hours as needed for wheezing or shortness of breath.   amitriptyline   (ELAVIL ) 10 MG tablet Take 1 tablet (10 mg total) by mouth at bedtime.   aspirin  EC 81 MG tablet Take 81 mg by mouth daily. Swallow whole.   atorvastatin  (LIPITOR) 20 MG tablet Take 1 tablet (20 mg total) by mouth daily.   budesonide -glycopyrrolate -formoterol  (BREZTRI  AEROSPHERE) 160-9-4.8 MCG/ACT AERO inhaler Inhale 2 puffs by mouth twice daily.  Rinse mouth after use   chlorhexidine  (PERIDEX ) 0.12 % solution Use as directed 15 mLs in the mouth or throat 2 (two) times daily.   clobetasol  (TEMOVATE ) 0.05 % external solution Apply 1 Application topically 2 (two) times daily as needed (irritation).   dicyclomine  (BENTYL ) 10 MG capsule Take 1 capsule (10 mg total) by mouth every 6 (six) hours as needed for spasms.   fluticasone  (FLONASE ) 50 MCG/ACT nasal spray Place 2 sprays into both nostrils 2 (two) times daily.   ketoconazole  (NIZORAL ) 2 % cream APPLY TO AREA ON ARM DAILY FOR 2 WEEKS   levocetirizine (XYZAL  ALLERGY 24HR) 5 MG tablet Take 1 tablet (5 mg total) by mouth every evening.   levothyroxine  (SYNTHROID ) 150 MCG tablet Take 1 tablet (150 mcg total) by mouth daily before breakfast.   Melatonin 10 MG CAPS Take 10 mg by mouth at bedtime.   modafinil  (PROVIGIL ) 200 MG tablet Take 1 tablet (200 mg total) by mouth in the morning.   Multiple Vitamin (MULTIVITAMIN WITH MINERALS) TABS tablet Take 1 tablet by mouth daily.   omeprazole  (PRILOSEC) 40 MG capsule TAKE 1 CAPSULE(40 MG) BY MOUTH DAILY AS  NEEDED FOR HEARTBURN   No facility-administered encounter medications on file as of 08/06/2024.    Allergies as of 08/06/2024   (No Known Allergies)    Past Medical History:  Diagnosis Date   Anemia    Asthma    Colon polyps    COPD (chronic obstructive pulmonary disease) (HCC)    Depression    GERD (gastroesophageal reflux disease)    Heart murmur    childhood   History of alcohol abuse    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)   History of blood transfusion 2006   HLD (hyperlipidemia)     Hypothyroidism    Obesity    Sleep apnea    cpap   Thyroid  disease     Past Surgical History:  Procedure Laterality Date   AUGMENTATION MAMMAPLASTY Bilateral 1987   Saline   BREAST BIOPSY     INSERTION OF MESH N/A 07/16/2019   Procedure: Insertion Of Mesh;  Surgeon: Vernetta Berg, MD;  Location: [facility] OR;  Service: General;  Laterality: N/A;   LAPAROSCOPIC GASTRIC BANDING  2010   VENTRAL HERNIA REPAIR N/A 07/16/2019   Procedure: VENTRAL INCISIONAL HERNIA REPAIR WITH MESH;  Surgeon: Vernetta Berg, MD;  Location: [facility] OR;  Service: General;  Laterality: N/A;   XI ROBOTIC ASSISTED VENTRAL HERNIA N/A 03/19/2023   Procedure: OPEN PRIMARY CLOSURE OF RECURRENT INCISIONAL HERNIA AND REMOVAL OF INTRAPERITONEAL MESH;  Surgeon: Lyndel Deward PARAS, MD;  Location: [facility] ORS;  Service: General;  Laterality: N/A;    Family History  Problem Relation Age of Onset   Alcohol abuse Mother    Depression Mother    Early death Mother    Liver disease Mother    Alcohol abuse Father    Obesity Father    Cancer Father     Social History   Socioeconomic History   Marital status: Single    Spouse name: Not on file   Number of children: Not on file   Years of education: Not on file   Highest education level: Associate degree: occupational, Scientist, product/process development, or vocational program  Occupational History   Not on file  Tobacco Use   Smoking status: Some Days    Current packs/day: 0.25    Average packs/day: 0.3 packs/day for 30.0 years (7.5 ttl pk-yrs)    Types: Cigarettes   Smokeless tobacco: Never   Tobacco comments:    Quit for 3 years  Vaping Use   Vaping status: Some Days  Substance and Sexual Activity   Alcohol use: No    Comment: 13 years sober   Drug use: No   Sexual activity: Not Currently  Other Topics Concern   Not on file  Social History Narrative   Not on file   Social Drivers of Health   Financial Resource Strain: Medium Risk (03/23/2024)   Overall Financial Resource Strain  (CARDIA)    Difficulty of Paying Living Expenses: Somewhat hard  Food Insecurity: Food Insecurity Present (03/23/2024)   Hunger Vital Sign    Worried About Running Out of Food in the Last Year: Often true    Ran Out of Food in the Last Year: Sometimes true  Transportation Needs: No Transportation Needs (03/23/2024)   PRAPARE - Administrator, Civil Service (Medical): No    Lack of Transportation (Non-Medical): No  Physical Activity: Insufficiently Active (03/23/2024)   Exercise Vital Sign    Days of Exercise per Week: 3 days    Minutes of Exercise per Session: 30 min  Stress: Stress Concern Present (03/23/2024)   [employer] of Occupational Health - Occupational Stress Questionnaire    Feeling of Stress : Very much  Social Connections: Moderately Integrated (03/23/2024)   Social Connection and Isolation Panel    Frequency of Communication with Friends and Family: More than three times a week    Frequency of Social Gatherings with Friends and Family: Once a week    Attends Religious Services: 1 to 4 times per year    Active Member of [employer] or Organizations: Yes    Attends Engineer, structural: More than 4 times per year    Marital Status: Divorced  Catering manager Violence: Not on file    Review of Systems  Constitutional:  Positive for fatigue.  Respiratory:  Positive for apnea, cough, shortness of breath and wheezing.   Psychiatric/Behavioral:  Positive for sleep disturbance.     Vitals:   08/06/24 1024  BP: (!) 168/93  Pulse: 96  SpO2: 97%     Physical Exam Constitutional:      Appearance: She is obese.  HENT:     Head: Normocephalic.     Mouth/Throat:     Mouth: Mucous membranes are moist.  Eyes:     General: No scleral icterus. Cardiovascular:     Rate and Rhythm: Normal rate and regular rhythm.     Heart sounds: No murmur heard.    No friction rub.  Pulmonary:     Effort: No respiratory distress.     Breath sounds: No stridor.  Wheezing present. No rhonchi.  Musculoskeletal:     Cervical back: No rigidity or tenderness.  Neurological:     Mental Status: She is alert.  Psychiatric:        Mood and Affect: Mood normal.     Data Reviewed: Records reviewed  Previous sleep study was not available to be reviewed Last visit with Dr. Frann reviewed  CT scan 2017 [facility] did reveal emphysematous changes  Assessment/Plan:  Chronic obstructive pulmonary disease -Recently quit smoking - Persistent symptoms of shortness of breath and wheezing - Continue Breztri  - Will provided prescription for nebulizer and albuterol  to be used as needed  Obstructive sleep apnea -Severity is unclear - She is compliant with CPAP use - Last study was many years ago - Will schedule for an in-lab split-night study to optimize treatment  Class III obesity - Encourage weight loss measures - Referral to weight management clinic  Patient was ambulated today -Saturations maintained with the lowest saturations documented at 89%  Blood pressure 168/93, rechecked at the end of visit 132/87  Heavy smoking history - Will schedule for lung cancer screening CT  Schedule for pulmonary function test to ascertain severity of obstructive lung disease  Follow-up in 8 to 12 weeks  Ongoing concerns with anxiety - Encouraged to decrease use of albuterol  as tolerated - Optimize treatment for anxiety  Patient encouraged to procure an oximeter to check her oxygen levels periodically  Jennet Epley MD [facility] 08/06/2024, 10:35 AM  CC: Frann Mabel Mt*

## 2024-08-09 ENCOUNTER — Ambulatory Visit (HOSPITAL_BASED_OUTPATIENT_CLINIC_OR_DEPARTMENT_OTHER)

## 2024-08-11 ENCOUNTER — Encounter (HOSPITAL_BASED_OUTPATIENT_CLINIC_OR_DEPARTMENT_OTHER): Payer: Self-pay | Admitting: Pulmonary Disease

## 2024-08-12 ENCOUNTER — Encounter: Payer: Self-pay | Admitting: Pulmonary Disease

## 2024-08-13 ENCOUNTER — Other Ambulatory Visit: Payer: Self-pay

## 2024-08-13 ENCOUNTER — Other Ambulatory Visit (HOSPITAL_COMMUNITY): Payer: Self-pay

## 2024-08-14 ENCOUNTER — Telehealth (HOSPITAL_BASED_OUTPATIENT_CLINIC_OR_DEPARTMENT_OTHER): Payer: Self-pay

## 2024-08-14 NOTE — Telephone Encounter (Signed)
 Hey is there any other free standing ones she could go to like [facility]? Or would even a [facility] facility work?

## 2024-08-14 NOTE — Telephone Encounter (Signed)
 Copied from CRM [id number]. Topic: Clinical - Request for Lab/Test Order >> Aug 13, 2024  2:59 PM Lavanda D wrote: Reason for CRM: Patient is requesting the order for her CT be sent elsewhere, she needs it sent to a stand-alone facility in network that covers 100%. [facility] is not covered under her insurance please call to follow-up.

## 2024-08-16 ENCOUNTER — Ambulatory Visit (HOSPITAL_BASED_OUTPATIENT_CLINIC_OR_DEPARTMENT_OTHER)
Admission: RE | Admit: 2024-08-16 | Discharge: 2024-08-16 | Disposition: A | Discharge status: Home / Self Care | Source: Ambulatory Visit | Attending: Otolaryngology | Admitting: Otolaryngology

## 2024-08-16 DIAGNOSIS — J392 Other diseases of pharynx: Secondary | ICD-10-CM | POA: Insufficient documentation

## 2024-08-16 DIAGNOSIS — Q892 Congenital malformations of other endocrine glands: Secondary | ICD-10-CM | POA: Insufficient documentation

## 2024-08-16 MED ORDER — GADOBUTROL 1 MMOL/ML IV SOLN
10.0000 mL | Freq: Once | INTRAVENOUS | Status: AC | PRN
  Administered 2024-08-16: 10 mL via INTRAVENOUS

## 2024-08-17 ENCOUNTER — Ambulatory Visit
Admission: RE | Admit: 2024-08-17 | Discharge: 2024-08-17 | Disposition: A | Discharge status: Home / Self Care | Source: Ambulatory Visit | Attending: Pulmonary Disease | Admitting: Pulmonary Disease

## 2024-08-17 DIAGNOSIS — J449 Chronic obstructive pulmonary disease, unspecified: Secondary | ICD-10-CM

## 2024-08-17 NOTE — Telephone Encounter (Signed)
 Patient is already having CT [facility]

## 2024-08-17 NOTE — Telephone Encounter (Signed)
 Is it going to cover 100% of the CT?

## 2024-08-18 ENCOUNTER — Encounter (HOSPITAL_BASED_OUTPATIENT_CLINIC_OR_DEPARTMENT_OTHER): Payer: Self-pay | Admitting: Pulmonary Disease

## 2024-08-18 ENCOUNTER — Telehealth (INDEPENDENT_AMBULATORY_CARE_PROVIDER_SITE_OTHER): Payer: Self-pay

## 2024-08-18 ENCOUNTER — Ambulatory Visit (INDEPENDENT_AMBULATORY_CARE_PROVIDER_SITE_OTHER): Payer: Self-pay

## 2024-08-18 NOTE — Telephone Encounter (Signed)
 LVM for patient to call us back

## 2024-08-18 NOTE — Telephone Encounter (Signed)
Spoke to patient regarding results. Patient understood.

## 2024-08-24 ENCOUNTER — Ambulatory Visit: Admitting: Audiologist

## 2024-08-24 ENCOUNTER — Ambulatory Visit: Admitting: Internal Medicine

## 2024-08-24 ENCOUNTER — Encounter: Payer: Self-pay | Admitting: Family Medicine

## 2024-08-24 ENCOUNTER — Ambulatory Visit

## 2024-08-24 DIAGNOSIS — J449 Chronic obstructive pulmonary disease, unspecified: Secondary | ICD-10-CM

## 2024-08-24 LAB — PULMONARY FUNCTION TEST
DL/VA % pred: 125 %
DL/VA: 5.26 ml/min/mmHg/L
DLCO unc % pred: 88 %
DLCO unc: 17.3 ml/min/mmHg
FEF 25-75 Post: 0.56 L/s
FEF 25-75 Pre: 0.35 L/s
FEF2575-%Change-Post: 58 %
FEF2575-%Pred-Post: 25 %
FEF2575-%Pred-Pre: 16 %
FEV1-%Change-Post: 19 %
FEV1-%Pred-Post: 40 %
FEV1-%Pred-Pre: 33 %
FEV1-Post: 0.97 L
FEV1-Pre: 0.81 L
FEV1FVC-%Change-Post: 4 %
FEV1FVC-%Pred-Pre: 63 %
FEV6-%Change-Post: 13 %
FEV6-%Pred-Post: 60 %
FEV6-%Pred-Pre: 53 %
FEV6-Post: 1.83 L
FEV6-Pre: 1.61 L
FEV6FVC-%Change-Post: 0 %
FEV6FVC-%Pred-Post: 101 %
FEV6FVC-%Pred-Pre: 101 %
FVC-%Change-Post: 14 %
FVC-%Pred-Post: 60 %
FVC-%Pred-Pre: 52 %
FVC-Post: 1.89 L
FVC-Pre: 1.65 L
Post FEV1/FVC ratio: 52 %
Post FEV6/FVC ratio: 97 %
Pre FEV1/FVC ratio: 49 %
Pre FEV6/FVC Ratio: 98 %
RV % pred: 255 %
RV: 5.22 L
TLC % pred: 143 %
TLC: 7.15 L

## 2024-08-24 NOTE — Progress Notes (Signed)
 Full pft performed today

## 2024-08-24 NOTE — Patient Instructions (Addendum)
 Full pft performed today

## 2024-08-25 ENCOUNTER — Other Ambulatory Visit: Payer: Self-pay

## 2024-08-25 DIAGNOSIS — R21 Rash and other nonspecific skin eruption: Secondary | ICD-10-CM

## 2024-08-27 ENCOUNTER — Telehealth: Payer: Self-pay

## 2024-08-27 NOTE — Telephone Encounter (Signed)
 Received CMN from [facility],sent fax,received fax confirmation

## 2024-08-31 ENCOUNTER — Encounter: Payer: Self-pay | Admitting: Family Medicine

## 2024-09-01 ENCOUNTER — Encounter (HOSPITAL_BASED_OUTPATIENT_CLINIC_OR_DEPARTMENT_OTHER): Payer: Self-pay | Admitting: Pulmonary Disease

## 2024-09-01 ENCOUNTER — Other Ambulatory Visit (HOSPITAL_BASED_OUTPATIENT_CLINIC_OR_DEPARTMENT_OTHER): Payer: Self-pay

## 2024-09-01 ENCOUNTER — Other Ambulatory Visit: Payer: Self-pay

## 2024-09-01 ENCOUNTER — Other Ambulatory Visit (HOSPITAL_COMMUNITY): Payer: Self-pay

## 2024-09-01 DIAGNOSIS — E039 Hypothyroidism, unspecified: Secondary | ICD-10-CM

## 2024-09-01 MED ORDER — LEVOTHYROXINE SODIUM 150 MCG PO TABS
150.0000 ug | ORAL_TABLET | Freq: Every day | ORAL | 0 refills | Status: DC
  Filled 2024-09-01: qty 90, 90d supply, fill #0

## 2024-09-02 ENCOUNTER — Other Ambulatory Visit: Payer: Self-pay

## 2024-09-02 DIAGNOSIS — E039 Hypothyroidism, unspecified: Secondary | ICD-10-CM

## 2024-09-02 MED ORDER — LEVOTHYROXINE SODIUM 150 MCG PO TABS: 150.0000 ug | ORAL_TABLET | Freq: Every day | ORAL | 0 refills | Status: AC

## 2024-09-03 ENCOUNTER — Other Ambulatory Visit: Payer: Self-pay

## 2024-09-03 ENCOUNTER — Other Ambulatory Visit (HOSPITAL_BASED_OUTPATIENT_CLINIC_OR_DEPARTMENT_OTHER): Payer: Self-pay

## 2024-09-04 ENCOUNTER — Ambulatory Visit (INDEPENDENT_AMBULATORY_CARE_PROVIDER_SITE_OTHER): Admitting: Otolaryngology

## 2024-09-04 ENCOUNTER — Encounter (INDEPENDENT_AMBULATORY_CARE_PROVIDER_SITE_OTHER): Payer: Self-pay | Admitting: Otolaryngology

## 2024-09-04 VITALS — BP 163/110 | HR 97

## 2024-09-04 DIAGNOSIS — J312 Chronic pharyngitis: Secondary | ICD-10-CM

## 2024-09-04 DIAGNOSIS — E038 Other specified hypothyroidism: Secondary | ICD-10-CM

## 2024-09-04 DIAGNOSIS — F17211 Nicotine dependence, cigarettes, in remission: Secondary | ICD-10-CM

## 2024-09-04 DIAGNOSIS — F17200 Nicotine dependence, unspecified, uncomplicated: Secondary | ICD-10-CM

## 2024-09-04 DIAGNOSIS — K219 Gastro-esophageal reflux disease without esophagitis: Secondary | ICD-10-CM

## 2024-09-04 DIAGNOSIS — Q892 Congenital malformations of other endocrine glands: Secondary | ICD-10-CM | POA: Diagnosis not present

## 2024-09-04 NOTE — Progress Notes (Signed)
 ENT Progress Note:   Update 09/04/2024  Discussed the use of AI scribe software for clinical note transcription with the patient, who gave verbal consent to proceed.  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   She initially presented with a sore throat and was hospitalized due to a lesion in her throat. She experiences a sensation of a lump in her throat approximately every three months, which she has had for many years. This sensation is typically relieved with Advil.  She reports severe pulmonary issues, including wheezing and shortness of breath, which are exacerbated by minimal exertion such as walking down a hallway. She has undergone a CT chest and is scheduled for a sleep study as part of her ongoing workup.   She was prescribed a nasal spray for nasal congestion and drainage. She recently quit smoking on August 19th and has noticed an increased sensitivity to the smell of cigarette smoke.  Records Reviewed:  Initial Evaluation  Reason for Consult: chronic sore throat  after an episode of tonsillar abscess 04/2023  HPI: Discussed the use of AI scribe software for clinical note transcription with the patient, who gave verbal consent to proceed.  History of Present Illness Pamela [name] is a 63 year old female former smoker, quit smoking 02/25/24, hx of COPD, hx of BMI of 50, GERD, hx of gastric lap band surgery, hx of hiatal hernia, who is here for recurrent throat discomfort. She was referred by her primary care physician for follow-up due to recurrent throat discomfort, requiring multiple courses of abx.   She has a hx of admission for severe throat pain in June 2024, where an abscess was identified in her right tonsil. The abscess did not require admission 2/2 being small. During that admission, an ENT doctor noted a  nonspecific lesion along the base of her tongue on CT neck. Since then, she has experienced recurrent throat pain approximately every three months, managed with antibiotics. The pain is not as severe as during her hospitalization but is described as discomfort primarily on the right side of her throat. She has not required emergency care since the initial hospitalization.  She completed her last course of antibiotics in January, approximately three to four months ago. Antibiotics have been prescribed every three months, but her primary care physician is hesitant to continue this regimen without further evaluation. She quit smoking on April 1st and is currently using a nicotine patch, with support from a coach through [employer].  She has a history of heartburn and is currently taking pantoprazole  daily. 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 She has been taking Synthroid  for an underactive thyroid  for 25-30 years and denies any history of thyroid  nodules.  No difficulty swallowing or eating, and no recent tonsillar swelling or abscess formation. 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 She associates the onset of her throat pain with the recurrence of her reflux symptoms.   Records Reviewed:  05/05/23 D/c summary  63 year old F with PMH of COPD, OSA on CPAP, morbid obesity, anxiety, depression, HLD and hypothyroidism presenting with progressive right neck pain and swelling with  odynophagia and voice change 4 days and admitted for right tonsillar abscess and epiglottitis.  Hemodynamically stable.  Leukocytosis to 14.8.  CT soft tissue neck showed 1.4 x 0.8 cm low-density fluid collection in the right palatine tonsil worrisome for intratonsillar abscess, soft tissue  stranding in the right parapharyngeal space concerning for epiglottitis and possible right submandibular sialoadenitis and nonspecific tongue base lesion measuring 1.7 x 1.7 x 2.3 cm.  ED provider discussed CT finding with ENT.  The later recommended conservative management with IV antibiotics send the abscess is small.  Patient was started on Unasyn  and admitted.  Received Decadron  and Toradol  in ED.   The next day, formally evaluated by ENT, Dr. Roark.  Pain improved after resuming Toradol . On the day of discharge, patient felt much better.  Able to open her mouth and a soft diet.  Full range of motion.  Felt well and ready to go home.  Cleared for discharge by ENT on p.o. Augmentin .  She is discharged on Augmentin  for 5 more days to complete treatment course.  Also prescribed chlorhexidine  mouth rinse.  Can use Aleve or naproxen as needed for the next 3 to 4 days.  Right palatine tonsillar abscess/epiglottitis/sialoadenitis: Presents with progressive right neck pain, swelling, odynophagia and voice change.  Treated with Zithromax  and lidocaine  outpatient without significant improvement.  CT finding as above.  Improved with IV Unasyn  and Toradol . -Cleared for discharge by ENT.   -Discharged on p.o. Augmentin  for 5 more days to complete course. -Aleve or ibuprofen as needed for pain -Outpatient follow-up with ENT as needed -Chlorhexidine  mouthwash   Dr Roark consult note 05/04/23 Assessment/Plan: Right peritonsillar abscess-it is small and she today is actually doing significantly better albeit that may be from finally getting her pain medication correct.  We discussed the options of incision and drainage versus continued medical therapy.  She elects continued medical therapy.  I will check her again tomorrow and see how we have progressed.  She does have a enhancing mass at the base of tongue which is in the location of ectopic thyroid  tissue.  This will need further workup as an outpatient.    Past  Medical History:  Diagnosis Date   Anemia    Asthma    Colon polyps    COPD (chronic obstructive pulmonary disease) (HCC)    Depression    GERD (gastroesophageal reflux disease)    Heart murmur    childhood   History of alcohol abuse    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)   History of blood transfusion 2006   HLD (hyperlipidemia)    Hypothyroidism    Obesity    Sleep apnea    cpap   Thyroid  disease     Past Surgical History:  Procedure Laterality Date   AUGMENTATION MAMMAPLASTY Bilateral 1987   Saline   BREAST BIOPSY     INSERTION OF MESH N/A 07/16/2019   Procedure: Insertion Of Mesh;  Surgeon: Vernetta Berg, MD;  Location: [facility] OR;  Service: General;  Laterality: N/A;   LAPAROSCOPIC GASTRIC BANDING  2010   VENTRAL HERNIA REPAIR N/A 07/16/2019   Procedure: VENTRAL INCISIONAL HERNIA REPAIR WITH MESH;  Surgeon: Vernetta Berg, MD;  Location: [facility] OR;  Service: General;  Laterality: N/A;   XI ROBOTIC ASSISTED VENTRAL HERNIA N/A 03/19/2023   Procedure: OPEN PRIMARY CLOSURE OF RECURRENT INCISIONAL HERNIA AND REMOVAL OF INTRAPERITONEAL MESH;  Surgeon: Lyndel Deward PARAS, MD;  Location: [facility] ORS;  Service: General;  Laterality: N/A;  Family History  Problem Relation Age of Onset   Alcohol abuse Mother    Depression Mother    Early death Mother    Liver disease Mother    Alcohol abuse Father    Obesity Father    Cancer Father     Social History:  reports that she has quit smoking. Her smoking use included cigarettes. She has a 7.5 pack-year smoking history. She has never used smokeless tobacco. She reports that she does not drink alcohol and does not use drugs.  Allergies: No Known Allergies  Medications: I have reviewed the patient's current medications.  The PMH, PSH, Medications, Allergies, and SH were reviewed and updated.  ROS: Constitutional: Negative for fever, weight loss and weight gain. Cardiovascular: Negative for chest pain and dyspnea on  exertion. Respiratory: Is not experiencing shortness of breath at rest. Gastrointestinal: Negative for nausea and vomiting. Neurological: Negative for headaches. Psychiatric: The patient is not nervous/anxious  Blood pressure (!) 163/110, pulse 97, SpO2 90%. There is no height or weight on file to calculate BMI.  PHYSICAL EXAM:  Exam: General: Well-developed, well-nourished Respiratory Respiratory effort: Equal inspiration and expiration without stridor Cardiovascular Peripheral Vascular: Warm extremities with equal color/perfusion Eyes: No nystagmus with equal extraocular motion bilaterally Neuro/Psych/Balance: Patient oriented to person, place, and time; Appropriate mood and affect; Gait is intact with no imbalance; Cranial nerves I-XII are intact Head and Face Inspection: Normocephalic and atraumatic without mass or lesion Palpation: Facial skeleton intact without bony stepoffs Salivary Glands: No mass or tenderness Facial Strength: Facial motility symmetric and full bilaterally ENT Pinna: External ear intact and fully developed External canal: Canal is patent with intact skin Tympanic Membrane: Clear and mobile External Nose: No scar or anatomic deformity Internal Nose: Septum is relatively straight. No polyp, or purulence. Mucosal edema and erythema present.  Bilateral inferior turbinate hypertrophy.  Lips, Teeth, and gums: Mucosa and teeth intact and viable TMJ: No pain to palpation with full mobility Oral cavity/oropharynx: No erythema or exudate, no lesions present tonsils 1+ without exudate or erythema Neck Neck and Trachea: Midline trachea without mass or lesion Thyroid : No mass or nodularity Lymphatics: No lymphadenopathy  Studies Reviewed: CT neck w/contrast 05/03/2023 FINDINGS: Pharynx and larynx: There is diffuse tonsillar hypertrophy with a 1.4 x 0.9 cm low-density fluid collection in the right palatine tonsils, worrisome for a intra tonsillar abscess. There is  soft tissue stranding in the right parapharyngeal space and along the right pharyngeal mucosa to the level of the epiglottis. The right aspect of the epiglottis is asymmetrically thickened, which can be seen in the setting of epiglottitis.   Salivary glands: The right submandibular gland is asymmetrically enlarged with mild surrounding fat stranding. No mass, or stone.   Thyroid : Normal thyroid  tissue is not visualized at the expected location of the thyroid  gland. There is a hyperdense lesion of the base of the tongue (series 3, image 41) measuring 1.7 x 1.7 x 2.3 cm. This could represent a contrast-enhancing mass or ectopic thyroid  tissue. Recommend further evaluation with a nuclear medicine thyroid  uptake scan.   Lymph nodes: None enlarged or abnormal density.   Vascular: Negative.   Limited intracranial: Negative.   Visualized orbits: Negative.   Mastoids and visualized paranasal sinuses: Clear.   Skeleton: No acute or aggressive process.   Upper chest: Negative.   Other: There is subcutaneous soft tissue stranding in the Peri and submandibular soft tissues on the right (series 3, image 45)   IMPRESSION: 1. Diffuse tonsillar hypertrophy  with a 1.4 x 0.9 cm low-density fluid collection in the right palatine tonsils, worrisome for an intra tonsillar abscess. 2. Soft tissue stranding in the right parapharyngeal space and along the right pharyngeal mucosa to the level of the epiglottis, which can be seen in the setting of epiglottitis. 3. Asymmetric enlargement of the right submandibular gland with mild surrounding fat stranding, which can be seen in the setting of sialoadenitis. 4. Midline hyperdense lesion at the base of the tongue measuring 1.7 x 1.7 x 2.3 cm. This could represent a contrast-enhancing mass or ectopic thyroid  tissue. Recommend further evaluation with a nuclear medicine thyroid  uptake scan.     MRI neck 08/16/24 TECHNIQUE: Multiplanar,  multisequence MR imaging was performed following the administration of intravenous contrast.   CONTRAST:  10mL GADAVIST  GADOBUTROL  1 MMOL/ML IV SOLN   COMPARISON:  CT May 03, 2023   FINDINGS: There is a lesion at the base of the tongue. This is best seen on the sagittal image where it measures a proximally 1.9 x 1.7 cm and is unchanged from the previous CT study.   Normal thyroid  tissue is not identified in the thyroid  bed on the previous CT or on this study.   The parotid glands appear normal. The submandibular glands are normal.   The nasopharynx, oropharynx and hypopharynx appear normal.   No adenopathy.   IMPRESSION: Tongue base lesion consistent with ectopic thyroid  tissue unchanged from the previous CT May 03, 2023.  CT chest 08/25/24 IMPRESSION: 1. Lung-RADS 2, benign appearance or behavior. Continue annual screening with low-dose chest CT without contrast in 12 months. 2. Pulmonary artery enlargement suggests pulmonary arterial hypertension. 3. Aortic Atherosclerosis (ICD10-I70.0) and Emphysema (ICD10-J43.9).  Assessment/Plan: Encounter Diagnoses  Name Primary?   Chronic GERD Yes   Tobacco use disorder    Chronic sore throat    Ectopic thyroid  tissue    Other specified hypothyroidism      Assessment and Plan Assessment & Plan Chronic sore throat Intermittent right-sided throat discomfort, improved post-smoking cessation. Possible causes: chronic throat irritation from post-nasal drainage, GERD, LPR or due to presence of base of the tongue mass which appears to be c/w ectopic thyroid  tissue based on imaging (CT neck). No evidence of tonsillitis or pharyngitis today. She had 1+ symmetric tonsillar hypertrophy w/o exudate or erythema - Prescribe Xyzal  and Flonase  for postnasal drainage. - Advise daily salt water  gargles for two months. - Recommend continued smoking cessation. - Provide information on reflux management.  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 We discussed that imaging and exam today including flexible laryngoscopy support the notion that the mass is non-cancerous. Removal can be considered if significant symptoms occur. Her sx are mild currently and she denies dysphagia or odynophagia. Already on 150 mcg of Synthroid  daily for hypothyroidism.  - Order MRI neck w/con to monitor base of tongue lesion in terms of changes in size since CT neck was done approximately 1 year ago.  Gastroesophageal reflux disease (GERD) LPR GERD managed with pantoprazole . Reflux may contribute to throat discomfort. - Continue pantoprazole . - Advise use of complementary product to reduce reflux, Reflux Gourmet, after meals . - Provide information on reflux management.  History of smoking Former smoker, cessation improved throat symptoms. - Encourage  continued smoking cessation.  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 Normal thyroid  gland in the anterior neck - Surgical removal not recommended due to risks of bleeding and painful recovery, unless it grows more in the future and begins to obstruct upper airway  Allergic rhinitis Allergic rhinitis likely due to environmental irritants, possibly exacerbated by smoking. Smoking cessation may alleviate symptoms. - Advise daily use of prescribed Flonase  for allergy management. - continue Xyzal  5 mg daily  Pulmonary disease and Pulm HTN on CT chest Severe pulmonary issues with wheezing and shortness of breath. Undergoing further evaluation including sleep study. Symptoms  require ongoing attention from pulmonary specialists. - Encourage continued follow-up with pulmonary specialists and completion of recommended tests    Thank you for allowing me to participate in the care of this patient. Please do not hesitate to contact me with any questions or concerns.   Elena Larry, MD Otolaryngology [facility] Phone: [phone] Fax: [phone]    09/04/2024, 12:16 PM

## 2024-09-04 NOTE — Progress Notes (Signed)
 Patient explains that a medication she takes can make her BP go up. Advised that patient see PCP concerning BP.

## 2024-09-07 ENCOUNTER — Other Ambulatory Visit: Payer: Self-pay

## 2024-09-07 ENCOUNTER — Other Ambulatory Visit (HOSPITAL_COMMUNITY): Payer: Self-pay

## 2024-09-09 ENCOUNTER — Other Ambulatory Visit: Payer: Self-pay

## 2024-09-09 ENCOUNTER — Telehealth: Payer: Self-pay

## 2024-09-09 NOTE — Telephone Encounter (Signed)
 Copied from CRM [id number]. Topic: Referral - Question >> Sep 07, 2024  3:32 PM Pamela [name] wrote: Reason for CRM: Patient would like to confirm if she was referred to the correct specialist - [facility] or [facility].  Callback number: [phone]    Spoke w/ PT [facility] , new referral has been placed for [facility]     -[facility]

## 2024-09-14 ENCOUNTER — Other Ambulatory Visit (HOSPITAL_COMMUNITY): Payer: Self-pay

## 2024-09-29 NOTE — Telephone Encounter (Signed)
 Patient is calling again because she needs to know where she is supposed to go for the weight management.  Please return her call at [phone] to let her know the status of the new referral.   Thanks.

## 2024-09-29 NOTE — Telephone Encounter (Signed)
 Referral is closed. Called and spoke with the pt and let her know she will have to await call for information on location and appt.   [facility] referral was closed 10/15, can we check on this?

## 2024-09-29 NOTE — Telephone Encounter (Signed)
 Spoke with front desk at facility pt was oiginally sent to they were unable to reach the pt and closed referral informed me that they would not be able to see pt due to DX and gave me facility I could send to. Called and spoke with  [facility] and have sent the referral over to them.

## 2024-10-02 ENCOUNTER — Ambulatory Visit (HOSPITAL_BASED_OUTPATIENT_CLINIC_OR_DEPARTMENT_OTHER): Attending: Pulmonary Disease | Admitting: Pulmonary Disease

## 2024-10-02 DIAGNOSIS — G4733 Obstructive sleep apnea (adult) (pediatric): Secondary | ICD-10-CM | POA: Insufficient documentation

## 2024-10-07 ENCOUNTER — Other Ambulatory Visit (HOSPITAL_COMMUNITY): Payer: Self-pay

## 2024-10-07 ENCOUNTER — Other Ambulatory Visit: Payer: Self-pay

## 2024-10-07 ENCOUNTER — Other Ambulatory Visit: Payer: Self-pay | Admitting: Family Medicine

## 2024-10-07 DIAGNOSIS — E7849 Other hyperlipidemia: Secondary | ICD-10-CM

## 2024-10-07 MED ORDER — ATORVASTATIN CALCIUM 20 MG PO TABS
20.0000 mg | ORAL_TABLET | Freq: Every day | ORAL | 2 refills | Status: AC
  Filled 2024-10-07 – 2024-11-29 (×2): qty 90, 90d supply, fill #0
  Filled 2025-01-01: qty 90, 90d supply, fill #1

## 2024-10-12 ENCOUNTER — Encounter: Payer: Self-pay | Admitting: Pulmonary Disease

## 2024-10-12 ENCOUNTER — Ambulatory Visit: Payer: Self-pay | Admitting: Pulmonary Disease

## 2024-10-12 ENCOUNTER — Ambulatory Visit: Admitting: Pulmonary Disease

## 2024-10-12 ENCOUNTER — Telehealth: Payer: Self-pay

## 2024-10-12 VITALS — BP 120/60 | HR 93 | Ht 64.0 in | Wt 307.0 lb

## 2024-10-12 DIAGNOSIS — R0609 Other forms of dyspnea: Secondary | ICD-10-CM

## 2024-10-12 DIAGNOSIS — J4489 Other specified chronic obstructive pulmonary disease: Secondary | ICD-10-CM | POA: Diagnosis not present

## 2024-10-12 LAB — CBC WITH DIFFERENTIAL/PLATELET
Basophils Relative: 1.1 % (ref 0.0–3.0)
Eosinophils Relative: 1.1 % (ref 0.0–5.0)
HCT: 44.2 % (ref 36.0–46.0)
Hemoglobin: 14.3 g/dL (ref 12.0–15.0)
Lymphocytes Relative: 22.4 % (ref 12.0–46.0)
MCHC: 32.4 g/dL (ref 30.0–36.0)
MCV: 86.4 fl (ref 78.0–100.0)
Monocytes Relative: 7.4 % (ref 3.0–12.0)
Neutrophils Relative %: 68 % (ref 43.0–77.0)
Platelets: 365 K/uL (ref 150.0–400.0)
RBC: 5.12 Mil/uL — ABNORMAL HIGH (ref 3.87–5.11)
RDW: 14.5 % (ref 11.5–15.5)
WBC: 11.9 K/uL — ABNORMAL HIGH (ref 4.0–10.5)

## 2024-10-12 NOTE — Telephone Encounter (Signed)
 Pt is scheduled to see Dr Annella today. [facility]

## 2024-10-12 NOTE — Patient Instructions (Signed)
 Nice to meet you  Continue Breztri  2 puffs twice a day every day, rinse mouth with water  after use  Continue albuterol  as needed, I am sorry does not help much  Paperwork today for new medicine for COPD called Ohtuvayre , new nebulizer medicine to help with shortness of breath  Blood work today to evaluate for anemia and injectable medicines that can help in COPD or asthma  Consider a high-resolution CT scan of the chest with expiratory films to evaluate for possible tracheomalacia that can cause wheezing and shortness of breath that does not respond well to inhalers  Return to clinic with Dr. Neda as previously scheduled

## 2024-10-12 NOTE — Telephone Encounter (Signed)
 Handed in Ohtuvayre  paperwork to pharmacy

## 2024-10-12 NOTE — Progress Notes (Signed)
 @Patient  ID: Pamela [name], female    DOB: 04/19/1961, 63 y.o.   MRN: [id number]  No chief complaint on file.   Referring provider: Frann Mabel Mt*  HPI:   63 y.o. woman recently established care in our clinic with COPD, OSA related to morbid obesity here for acute visit with complaints of shortness of breath or wheezing, largely unchanged from prior visit and present for months.  Most recent PCP note reviewed.  Most recent pulmonary note reviewed.  Most recent chest imaging 07/2024 CT chest lung cancer screening low-dose reveals emphysema, clear lungs on my review and interpretation.  PFTs 07/2024 reviewed and interpreted as consistent with severe fixed obstruction with significant improvement in FVC after bronchodilator, hyperinflation and air trapping on lung volumes, DLCO within normal limits.  She complains of ongoing dyspnea on exertion, wheezing.  Worse with exercise.  When she is doing water  aerobics or in the pool symptoms are minimal.  We discussed at length her weight gain, could be contributing.  Discussed Breztri  which helps which is good news.  Discussed her COPD which could be contributing.  But discussed additional medications that could help with COPD.  Biologics and new nebulized therapies.  Discussed possible tracheomalacia which could mimic all the symptoms wheeze shortness of breath without significant improvement with rescue inhalers at [facility].  Questionaires / Pulmonary Flowsheets:   ACT:  Asthma Control Test ACT Total Score  08/06/2024 10:29 AM 10    MMRC:     No data to display          Epworth:     08/06/2024   10:00 AM  Results of the Epworth flowsheet  Sitting and reading 2  Watching TV 2  Sitting, inactive in a public place (e.g. a theatre or a meeting) 0  As a passenger in a car for an hour without a break 0  Lying down to rest in the afternoon when circumstances permit 1  Sitting and talking to someone 0  Sitting quietly after a  lunch without alcohol 0  In a car, while stopped for a few minutes in traffic 0  Total score 5    Tests:   FENO:  No results found for: NITRICOXIDE  PFT:    Latest Ref Rng & Units 08/24/2024    2:38 PM  PFT Results  FVC-Pre L 1.65   FVC-Predicted Pre % 52   FVC-Post L 1.89   FVC-Predicted Post % 60   Pre FEV1/FVC % % 49   Post FEV1/FCV % % 52   FEV1-Pre L 0.81   FEV1-Predicted Pre % 33   FEV1-Post L 0.97   DLCO uncorrected ml/min/mmHg 17.30   DLCO UNC% % 88   DLVA Predicted % 125   TLC L 7.15   TLC % Predicted % 143   RV % Predicted % 255   Personally reviewed interpreted as severe fixed obstruction, significant bronchodilator sponsor in FVC, air trapping, hyperinflation on lung volumes, DLCO within normal limits.  WALK:     08/06/2024   11:41 AM  SIX MIN WALK  Tech Comments: Patient was able to finish a lap and walked pretty fast as patient stated that is her normal . Patient did not drop below 89%.    Imaging: Sleep Study Documents Result Date: 10/09/2024 Ordered by an unspecified provider.   Lab Results:  CBC    Component Value Date/Time   WBC 7.2 03/30/2024 1017   RBC 4.95 03/30/2024 1017   HGB 14.4 03/30/2024 1017  HGB 15.2 06/05/2018 0937   HCT 43.5 03/30/2024 1017   HCT 45.3 06/05/2018 0937   PLT 376.0 03/30/2024 1017   MCV 88.0 03/30/2024 1017   MCV 87 06/05/2018 0937   MCH 29.5 05/05/2023 0049   MCHC 33.1 03/30/2024 1017   RDW 14.8 03/30/2024 1017   RDW 15.2 06/05/2018 0937   LYMPHSABS 1.5 05/03/2023 1044   LYMPHSABS 2.1 06/05/2018 0937   MONOABS 1.0 05/03/2023 1044   EOSABS 0.1 05/03/2023 1044   EOSABS 0.1 06/05/2018 0937   BASOSABS 0.1 05/03/2023 1044   BASOSABS 0.1 06/05/2018 0937    BMET    Component Value Date/Time   NA 140 03/30/2024 1017   NA 139 07/07/2020 1329   K 4.3 03/30/2024 1017   CL 101 03/30/2024 1017   CO2 31 03/30/2024 1017   GLUCOSE 105 (H) 03/30/2024 1017   BUN 12 03/30/2024 1017   BUN 9 07/07/2020 1329    CREATININE 0.55 03/30/2024 1017   CALCIUM  9.4 03/30/2024 1017   GFRNONAA >60 05/05/2023 0049   GFRAA 113 07/07/2020 1329    BNP No results found for: BNP  ProBNP No results found for: PROBNP  Specialty Problems       Pulmonary Problems   Chronic obstructive pulmonary disease (HCC)   COPD (chronic obstructive pulmonary disease) (HCC)   Tonsillar abscess    No Known Allergies  Immunization History  Administered Date(s) Administered   PFIZER Comirnaty(Gray Top)Covid-19 Tri-Sucrose Vaccine 01/29/2020, 02/23/2020, 09/26/2020   PNEUMOCOCCAL CONJUGATE-20 11/01/2021   Pfizer Covid-19 Vaccine Bivalent Booster 74yrs & up 11/01/2021   Tdap 05/23/2018    Past Medical History:  Diagnosis Date   Anemia    Asthma    Colon polyps    COPD (chronic obstructive pulmonary disease) (HCC)    Depression    GERD (gastroesophageal reflux disease)    Heart murmur    childhood   History of alcohol abuse    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)   History of blood transfusion 2006   HLD (hyperlipidemia)    Hypothyroidism    Obesity    Sleep apnea    cpap   Thyroid  disease     Tobacco History: Social History   Tobacco Use  Smoking Status Former   Current packs/day: 0.25   Average packs/day: 0.3 packs/day for 30.0 years (7.5 ttl pk-yrs)   Types: Cigarettes  Smokeless Tobacco Never  Tobacco Comments   Quit for 3 years. Patient quit 07/14/24   Counseling given: Not Answered Tobacco comments: Quit for 3 years. Patient quit 07/14/24   Continue to not smoke  Outpatient Encounter Medications as of 10/12/2024  Medication Sig   acetaminophen  (TYLENOL ) 500 MG tablet Take 1,000 mg by mouth daily as needed for moderate pain.   albuterol  (PROVENTIL ) (2.5 MG/3ML) 0.083% nebulizer solution Take 3 mLs (2.5 mg total) by nebulization every 6 (six) hours as needed for wheezing or shortness of breath.   albuterol  (VENTOLIN  HFA) 108 (90 Base) MCG/ACT inhaler Inhale 1-2 puffs into the lungs  every 6 (six) hours as needed for wheezing or shortness of breath.   amitriptyline  (ELAVIL ) 10 MG tablet Take 1 tablet (10 mg total) by mouth at bedtime.   aspirin  EC 81 MG tablet Take 81 mg by mouth daily. Swallow whole.   atorvastatin  (LIPITOR) 20 MG tablet Take 1 tablet (20 mg total) by mouth daily.   budesonide -glycopyrrolate -formoterol  (BREZTRI  AEROSPHERE) 160-9-4.8 MCG/ACT AERO inhaler Inhale 2 puffs into the lungs 2 (two) times daily. Rinse mouth  after use   busPIRone  (BUSPAR ) 10 MG tablet Take 20 mg by mouth 3 (three) times daily.   chlorhexidine  (PERIDEX ) 0.12 % solution Use as directed 15 mLs in the mouth or throat 2 (two) times daily.   clobetasol  (TEMOVATE ) 0.05 % external solution Apply 1 Application topically 2 (two) times daily as needed (irritation).   dicyclomine  (BENTYL ) 10 MG capsule Take 1 capsule (10 mg total) by mouth every 6 (six) hours as needed for spasms.   fluticasone  (FLONASE ) 50 MCG/ACT nasal spray Place 2 sprays into both nostrils 2 (two) times daily.   ketoconazole  (NIZORAL ) 2 % cream APPLY TO AREA ON ARM DAILY FOR 2 WEEKS   levocetirizine (XYZAL  ALLERGY 24HR) 5 MG tablet Take 1 tablet (5 mg total) by mouth every evening.   levothyroxine  (SYNTHROID ) 150 MCG tablet Take 1 tablet (150 mcg total) by mouth daily before breakfast.   Melatonin 10 MG CAPS Take 10 mg by mouth at bedtime.   modafinil  (PROVIGIL ) 200 MG tablet Take 1 tablet (200 mg total) by mouth in the morning.   Multiple Vitamin (MULTIVITAMIN WITH MINERALS) TABS tablet Take 1 tablet by mouth daily.   omeprazole  (PRILOSEC) 40 MG capsule TAKE 1 CAPSULE(40 MG) BY MOUTH DAILY AS NEEDED FOR HEARTBURN   sertraline (ZOLOFT) 100 MG tablet Take 100 mg by mouth daily.   No facility-administered encounter medications on file as of 10/12/2024.     Review of Systems  Review of Systems  No chest pain with exertion.  No orthopnea or PND.  Comprehensive review of systems otherwise negative. Physical Exam  There  were no vitals taken for this visit.  Wt Readings from Last 5 Encounters:  10/02/24 (!) 305 lb (138.3 kg)  08/06/24 (!) 304 lb 6.4 oz (138.1 kg)  03/30/24 289 lb (131.1 kg)  03/26/24 290 lb (131.5 kg)  06/26/23 259 lb 2 oz (117.5 kg)    BMI Readings from Last 5 Encounters:  10/02/24 52.35 kg/m  08/06/24 52.25 kg/m  03/30/24 49.61 kg/m  03/26/24 49.78 kg/m  06/26/23 44.48 kg/m     Physical Exam General: Sitting in chair, no distress Eyes: EOMI, no icterus Neck: Supple, habitus precludes accurate evaluation of JVP, auscultation over trachea reveals no stridor but does reveal high-pitched expiratory sounds not heard on lung exam Pulmonary: Distant, clear, no wheeze Cardiovascular: Regular rate and rhythm Abdomen: Nondistended   Assessment & Plan:   Dyspnea on exertion: Certainly a proportion to the mild emphysema seen on CT scan.  Frankly her PFTs are way out of portion to her imaging findings.  Given severity of COPD on PFTs I do wonder about overlapping asthma given severity of PFTs that are out of proportion to her images.  In addition, cardiac contributors are considered.  Finally, given her persistent wheeze and shortness of breath tracheomalacia is considered as well.  Consider high-res chest CT scan with expiratory film in the future if not improving with escalation of COPD/asthma therapies as below in effort to evaluate for tracheobronchomalacia.  I recommended cardiology consult but she declines this at this time, consider cardiology referral in the future.  Asthma/COPD overlap: Fixed obstruction with significant improvement in FVC after bronchodilator.  Likely significant driver of symptoms.  I think asthma is significant tributary given ongoing wheeze and PFTs proportion to the images.  Consider mimics of asthma such as tracheomalacia as above.  Continue Breztri .  Continue albuterol  rescue.  New paperwork for [place] , new start of medication.  Lab work today for  phenotyping,  eosinophils, IgE, seasonal allergen panel.   No follow-ups on file.  Keep scheduled follow-up with Dr. Neda on November 10, 2024.   Donnice JONELLE Beals, MD 10/12/2024   This appointment required 43 minutes of patient care (this includes precharting, chart review, review of results, face-to-face care, etc.).

## 2024-10-12 NOTE — Telephone Encounter (Signed)
 FYI Only or Action Required?: FYI only for provider: appointment scheduled on 10/12/2024 at 1:30 PM.  Patient is followed in Pulmonology for COPD, Sleep Apnea, last seen on 08/06/2024 by Neda Jennet LABOR, MD.  Called Nurse Triage reporting Shortness of Breath.  Symptoms began back in June but symptoms have worsened over the last month.  Interventions attempted: Rescue inhaler, Maintenance inhaler, Nebulizer treatments, and Increased fluids/rest.  Symptoms are: unchanged.  Triage Disposition: See HCP Within 4 Hours (Or PCP Triage)  Patient/caregiver understands and will follow disposition?: Yes E2C2 Pulmonary Triage - Initial Assessment Questions "Chief Complaint (e.g., cough, sob, wheezing, fever, chills, sweat or additional symptoms) *Go to specific symptom protocol after initial questions. Patient reports episodes of shortness of breath-endorses she is having 4-5 episodes of where she can't breath. Patient states this episodes of shortness of breath since June. Patient states she is having shortness of breath with walking but is able to swim at the [facility] without any issue. +wheezing. No cough, fever, chills or sweating.  Patient states the episodes are very upsetting and she has sent messages to the office without any messages back. Scheduled patient for an acute visit with another provider in the office for today at 1:30 PM.   "How long have symptoms been present?" Since June  Have you tested for COVID or Flu? Note: If not, ask patient if a home test can be taken. If so, instruct patient to call back for positive results. No  MEDICINES:   "Have you used any OTC meds to help with symptoms?" No If yes, ask "What medications?"   "Have you used your inhalers/maintenance medication?" Yes If yes, "What medications?" Albuterol  inhaler/nebulizer Breztri  2 puffs Daily  If inhaler, ask "How many puffs and how often?" Note: Review instructions on medication in the chart. Albuterol  1-2  puffs q6h Breztri  2 puffs Daily  OXYGEN: "Do you wear supplemental oxygen?" No If yes, "How many liters are you supposed to use?"   "Do you monitor your oxygen levels?" Yes If yes, What is your reading (oxygen level) today? 94%  What is your usual oxygen saturation reading?  (Note: Pulmonary O2 sats should be 90% or greater) 88-90%   Copied from CRM #[id number]. Topic: Clinical - Red Word Triage >> Oct 12, 2024  8:21 AM Corean SAUNDERS wrote: Red Word that prompted transfer to Nurse Triage: Patient is very scared because she is having consistent episodes where she can't breathe at all and she states she had one at 2 am this morning. Reason for Disposition  [1] Longstanding difficulty breathing AND [2] not responding to usual therapy  Answer Assessment - Initial Assessment Questions 1. RESPIRATORY STATUS: Describe your breathing? (e.g., wheezing, shortness of breath, unable to speak, severe coughing)      Shortness of breath, wheezing 6. CARDIAC HISTORY: Do you have any history of heart disease? (e.g., heart attack, angina, bypass surgery, angioplasty)      no 7. LUNG HISTORY: Do you have any history of lung disease?  (e.g., pulmonary embolus, asthma, emphysema)     yes 12. TRAVEL: Have you traveled out of the country in the last month? (e.g., travel history, exposures)       no  Protocols used: Breathing Difficulty-A-AH

## 2024-10-13 ENCOUNTER — Telehealth: Payer: Self-pay | Admitting: Pulmonary Disease

## 2024-10-13 DIAGNOSIS — Z0289 Encounter for other administrative examinations: Secondary | ICD-10-CM

## 2024-10-13 DIAGNOSIS — G4733 Obstructive sleep apnea (adult) (pediatric): Secondary | ICD-10-CM

## 2024-10-13 LAB — IGE: IgE (Immunoglobulin E), Serum: 19 kU/L (ref <=114)

## 2024-10-13 NOTE — Telephone Encounter (Signed)
 Call patient  Sleep study result  Date of study: 10/02/2024  Impression: Mild obstructive sleep apnea with AHI of 11.6 Poor sleep efficiency of 34% Required 1 L of oxygen added for desaturations Study limited by insomnia Periodic limb movement with associated arousals  Recommendation:  Schedule for office visit to further discuss findings on sleep study  Will require titration study to ascertain optimal CPAP settings and possible need for oxygen supplementation with CPAP  Evaluation and management of contributing insomnia, this may be related to not using CPAP during the study, limited number of events during the diagnostic portion precluded initiation of CPAP therapy.

## 2024-10-13 NOTE — Telephone Encounter (Signed)
 I called and spoke to Pamela [name]. Pamela [name] informed of Dr Cathye note and verbalized understanding. Pamela [name] will keep upcoming appt with Dr MALVA to better discuss things. NFN

## 2024-10-13 NOTE — Procedures (Signed)
 [facility] [address] [place], [place] [address] Tel: [phone]   Fax: [phone]  Polysomnography Interpretation  Patient Name:  Pamela [name], Pamela [name] Date:  10/02/2024 Referring Physician:  JENNET EPLEY [phone]) %%startinterp%% Indications for Polysomnography The patient is a 63 year-old Female who is 5' 4 and weighs 305.0 lbs. Her BMI equals 52.7.  A full night polysomnogram was performed to evaluate for -.  Medication was administered at 2030  Aspirin  EC  Atorvastatin   Breztri   Buspirone   Hydroxyzine   Levothyroxine   Omeprazole    Polysomnogram Data A full night polysomnogram recorded the standard physiologic parameters including EEG, EOG, EMG, EKG, nasal and oral airflow.  Respiratory parameters of chest and abdominal movements were recorded with Respiratory Inductance Plethysmography belts.  Oxygen saturation was recorded by pulse oximetry.   Sleep Architecture The total recording time of the polysomnogram was 441.7 minutes.  The total sleep time was 150.5 minutes.  The patient spent 6.3% of total sleep time in Stage N1, 62.8% in Stage N2, 0.0% in Stages N3, and 30.9% in REM.  Sleep latency was 126.7 minutes.  REM latency was 262.5 minutes.  Sleep Efficiency was 34.1%.  Wake after Sleep Onset time was 162.5 minutes.  Respiratory Events The polysomnogram revealed a presence of - obstructive, - central, and - mixed apneas resulting in an Apnea index of - events per hour.  There were 29 hypopneas (>=3% desaturation and/or arousal) resulting in an Apnea\Hypopnea Index (AHI >=3% desaturation and/or arousal) of 11.6 events per hour.  There were 7 hypopneas (>=4% desaturation) resulting in an Apnea\Hypopnea Index (AHI >=4% desaturation) of 2.8 events per hour.  There were 6 Respiratory Effort Related Arousals resulting in a RERA index of 2.4 events per hour. The Respiratory Disturbance Index is 14.0 events per hour.  The snore index was 187.0  events per hour.  Mean oxygen saturation was 89.6%.  The lowest oxygen saturation during sleep was 83.0%.  Time spent <=88% oxygen saturation was 104.3 minutes (24.5%).  Limb Activity There were 239 total limb movements recorded, of this total, 223 were classified as PLMs.  PLM index was 88.9 per hour and PLM associated with Arousals index was 33.1 per hour.  Cardiac Summary The average pulse rate was 75.3 bpm.  The minimum pulse rate was 63.0 bpm while the maximum pulse rate was 96.0 bpm.  Cardiac rhythm was normal/abnormal.  Comments: Patient with known obstructive sleep apnea who came in for a split-night study.  Study was completed as a diagnostic study as patient had very poor sleep.  Difficulty initiating sleep, difficulty maintaining sleep.  Required 1 L of oxygen added to the system.  Diagnosis:  Mild obstructive sleep apnea with AHI of 11.6. Very poor sleep efficiency with difficulty initiating sleep, difficulty maintaining sleep.  Sleep efficiency of 34% Delayed sleep onset, increased wake after sleep onset, delayed REM onset. 1 L of oxygen was added for desaturations Increased periodic limb movement with associated arousals of a moderate degree Cardiac rhythm was sinus Patient's history significant for known obstructive sleep apnea with compliance to CPAP, this may have contributed to the poor sleep  Recommendations: Schedule for titration study May require oxygen supplementation with CPAP therapy Evaluate for causes and contributors to insomnia Optimize sleep hygiene Evaluate and manage PLMD if associated with clinical symptoms  Suggest clinical evaluation to address contributors to insomnia prior to scheduling for titration study  This study was personally reviewed and electronically signed by: JENNET EPLEY, MD Accredited Board Certified  in Sleep Medicine Date/Time:   10/13/24 %%endinterp%%   Diagnostic PSG Report  Patient Name: Pamela [name], Pamela [name] Date:  10/02/2024  Date of Birth: Jul 25, 1961 Study Type: Diagnostic  Age: 63 year MRN #: [id number]  Sex: Female Interpreting Physician: NEDA HAMMOND, [id number]  Height: 5' 4 Referring Physician: HAMMOND NEDA [phone])  Weight: 305.0 lbs Recording Tech: Firman Anon RPSGT RST  BMI: 52.7 Scoring Tech: Firman Anon RPSGT RST  ESS: 8 Neck Size: 16  Mask Type RESMED AIRFIT N20    Mask Size: SMALL Supplemental O2: 1 liter   Study Overview  Lights Off: 09:39:34 PM  Count Index  Lights On: 05:01:15 AM Awakenings: 18 7.2  Time in Bed: 441.7 min. Arousals: 113 45.0  Total Sleep Time: 150.5 min. AHI (>=3% Desat and/or Ar.): 29 11.6   Sleep Efficiency: 34.1% AHI (>=4% Desat): 7 2.8   Sleep Latency: 126.7 min. Limb Movements: 239 95.3  Wake After Sleep Onset: 162.5 min. Snore: 469 187.0  REM Latency from Sleep Onset: 262.5 min. Desaturations: 32 12.8     Minimum SpO2 TST: 83.0%    Sleep Architecture  % of Time in Bed Stages Time (mins) % Sleep Time  Wake 289.5   Stage N1 9.5 6.3%  Stage N2 94.5 62.8%  Stage N3 0.0 0.0%  REM 46.5 30.9%   Arousal Summary   NREM REM Sleep Index  Respiratory Arousals 9 5 14  5.6  PLM Arousals 83 - 83 33.1  Isolated Limb Movement Arousals - 3 3 1.2  Snore Arousals 8 - 8 3.2  Spontaneous Arousals 6 3 9  3.6  Total 104 9 113 45.0   Limb Movement Summary   Count Index  Isolated Limb Movements 16 6.4  Periodic Limb Movements (PLMs) 223 88.9  Total Limb Movements 239 95.3    Respiratory Summary   By Sleep Stage By Body Position Total   NREM REM Supine Non-Supine   Time (min) 104.0 46.5 - 150.5 150.5         Obstructive Apnea - - - - -  Mixed Apnea - - - - -  Central Apnea - - - - -  Total Apneas - - - - -  Total Apnea Index - - - - -         Hypopneas (>=3% Desat and/or Ar.) 11 18 - 29 29  AHI (>=3% Desat and/or Ar.) 6.3 23.2 - 11.6 11.6         Hypopneas (>=4% Desat) - 7 - 7 7  AHI (>=4% Desat) - 9.0 - 2.8 2.8          RERAs 2 4 - 6 6   RERA Index 1.2 5.2 - 2.4 2.4         RDI 7.5 28.4 - 14.0 14.0    Respiratory Event Type Index  Central Apneas -  Obstructive Apneas -  Mixed Apneas -  Central Hypopneas -  Obstructive Hypopneas -  Central Apnea + Hypopnea (CAHI) -  Obstructive Apnea + Hypopnea (OAHI) 12.0   Respiratory Event Durations   Apnea Hypopnea   NREM REM NREM REM  Average (seconds) - - 18.6 19.9  Maximum (seconds) - - 28.8 28.4    Oxygen Saturation Summary   Wake NREM REM TST TIB  Average SpO2 (%) 89.8% 89.6% 87.9% 89.1% 89.6%  Minimum SpO2 (%) 82.0% 87.0% 83.0% 83.0% 82.0%  Maximum SpO2 (%) 95.0% 92.0% 92.0% 92.0% 95.0%   Oxygen Saturation Distribution  Range (%) Time in range (min) Time in  range (%)  90.0 - 100.0 126.5 29.7%  80.0 - 90.0 299.9 70.3%  70.0 - 80.0 - -  60.0 - 70.0 - -  50.0 - 60.0 - -  0.0 - 50.0 - -  Time Spent <=88% SpO2  Range (%) Time in range (min) Time in range (%)  0.0 - 88.0 104.3 24.5%      Count Index  Desaturations 32 12.8    Cardiac Summary   Wake NREM REM Sleep Total  Average Pulse Rate (BPM) 77.0 70.1 76.6 72.1 75.3  Minimum Pulse Rate (BPM) 63.0 63.0 63.0 63.0 63.0  Maximum Pulse Rate (BPM) 96.0 82.0 86.0 86.0 96.0   Pulse Rate Distribution:  Range (bpm) Time in range (min) Time in range (%)  0.0 - 40.0 - -  40.0 - 60.0 - -  60.0 - 80.0 361.1 84.7%  80.0 - 100.0 60.5 14.2%  100.0 - 120.0 - -  120.0 - 140.0 - -  140.0 - 200.0 - -   Supplemental O2 Summary  O2 Level Time (min) Wake (min) NREM (min) REM (min) Supine TST (min) Sleep Eff% OA# CA# MA# Hyp# (>=3%) AHI (>=3%) Hyp# (>=4%) AHI (>=%4) RERA RDI SpO2 <=88% (min) Min SpO2 Mean SpO2 Ar. Index  - 64.0 64.0 0.0 0.0  0.0 0.0%               O2: 1 378.0 225.5 104.0 46.5  0.0 39.8% - - - 29 11.6 7  2.8 6  14.0  43.0 83.0 89.1 45.0   Hypnograms                      Technologist Comments  THE 63 YEAR-OLD FEMALE PATIENT PRESENTED TO THE SLEEP DISORDER CENTER FOR A SPLIT  NIGHT STUDY WITH A CHIEF COMPLAINT OF OSA ON CPAP. ALL BEDTIME MEDICATIONS WERE SELF ADMINISTERED AT 2030. THE PATIENT WAS FITTED WITH A SMALL RESMED AIRFIT N20 NASAL MASK FOR PRE-CPAP TRIAL, WHICH WAS TOLERATED WELL. THE LEAD PLACEMENT WAS INITIATED, THEN THE STUDY WAS BEGUN. 1 LITER OF SUPPLEMENTAL OXYGEN WAS WARRANTED DURING THE STUDY. MODERATE SNORING WAS NOTED THROUGHOUT THE STUDY. PLMs - PLMAs WERE NOTED. NO OBVIOUS CARDIAC ARRHYTHMIAS WERE OBSERVED. TWO RESTROOM VISITS WERE MADE. NO OBVIOUS PARASOMNIAS WERE OBSERVED. SPLIT NIGHT CRITERIA WAS NOT MET DUE TO INSUFFICIENT SLEEP TIME, THEREFORE A NPSG DIAGNOSTIC STUDY WAS CONTINUED. THE PATIENT DIDN'T TOLERATE THE NPSG DIAGNOSTIC STUDY DUE TO HER BEING UNCOMFORTABLE. COUGHING WAS NOTED.

## 2024-10-13 NOTE — Telephone Encounter (Signed)
 ATC X1, LMTCB

## 2024-10-13 NOTE — Procedures (Signed)
 Indications for Polysomnography The patient is a 63 year-old Female who is 5' 4 and weighs 305.0 lbs. Her BMI equals 52.7.  A full night polysomnogram was performed to evaluate for -.  Medication was administered at 2030Aspirin ECAtorvastatinBreztriBuspironeHydroxyzineLevothyroxineOmeprazole Polysomnogram Data A full night polysomnogram recorded the standard physiologic parameters including EEG, EOG, EMG, EKG, nasal and oral airflow.  Respiratory parameters of chest and abdominal movements were recorded with Respiratory Inductance Plethysmography belts.   Oxygen saturation was recorded by pulse oximetry.  Sleep Architecture The total recording time of the polysomnogram was 441.7 minutes.  The total sleep time was 150.5 minutes.  The patient spent 6.3% of total sleep time in Stage N1, 62.8% in Stage N2, 0.0% in Stages N3, and 30.9% in REM.  Sleep latency was 126.7 minutes.   REM latency was 262.5 minutes.  Sleep Efficiency was 34.1%.  Wake after Sleep Onset time was 162.5 minutes.  Respiratory Events The polysomnogram revealed a presence of - obstructive, - central, and - mixed apneas resulting in an Apnea index of - events per hour.  There were 29 hypopneas (GreaterEqual to3% desaturation and/or arousal) resulting in an Apnea\Hypopnea Index (AHI  GreaterEqual to3% desaturation and/or arousal) of 11.6 events per hour.  There were 7 hypopneas (GreaterEqual to4% desaturation) resulting in an Apnea\Hypopnea Index (AHI GreaterEqual to4% desaturation) of 2.8 events per hour.  There were 6 Respiratory  Effort Related Arousals resulting in a RERA index of 2.4 events per hour. The Respiratory Disturbance Index is 14.0 events per hour.  The snore index was 187.0 events per hour.  Mean oxygen saturation was 89.6%.  The lowest oxygen saturation during sleep was 83.0%.  Time spent LessEqual to88% oxygen saturation was  minutes ().  Limb Activity There were 239 total limb movements recorded, of this total,  223 were classified as PLMs.  PLM index was 88.9 per hour and PLM associated with Arousals index was 33.1 per hour.  Cardiac Summary The average pulse rate was 75.3 bpm.  The minimum pulse rate was 63.0 bpm while the maximum pulse rate was 96.0 bpm.  Cardiac rhythm was normal/abnormal.  Comments: Patient with known obstructive sleep apnea who came in for a split-night study.  Study was completed as a diagnostic study as patient had very poor sleep.  Difficulty initiating sleep, difficulty maintaining sleep.  Required 1 L of oxygen added to the  system.  Diagnosis: Mild obstructive sleep apnea with AHI of 11.6. Very poor sleep efficiency with difficulty initiating sleep, difficulty maintaining sleep.  Sleep efficiency of 34% Delayed sleep onset, increased wake after sleep onset, delayed REM onset. 1 L of oxygen was added for desaturations Increased periodic limb movement with associated arousals of a moderate degree Cardiac rhythm was sinus Patient's history significant for known obstructive sleep apnea with compliance to CPAP, this may have contributed to the poor sleep  Recommendations: Schedule for titration study May require oxygen supplementation with CPAP therapy Evaluate for causes and contributors to insomnia Optimize sleep hygiene Evaluate and manage PLMD if associated with clinical symptoms  Suggest clinical evaluation to address contributors to insomnia prior to scheduling for titration study  This study was personally reviewed and electronically signed by: JENNET EPLEY, MD Accredited Board Certified in Sleep Medicine Date/Time:  10/13/24  Indications for Polysomnography The patient is a 63 year-old Female who is 5' 4 and weighs 305.0 lbs. Her BMI equals 52.7.  A full night polysomnogram was performed to evaluate for -.  MedicationAspirin ECAtorvastatinBreztriBuspironeHydroxyzineLevothyroxineOmeprazole Polysomnogram Data A full night  polysomnogram recorded the  standard physiologic parameters including EEG, EOG, EMG, EKG, nasal and oral airflow.  Respiratory parameters of chest and abdominal movements were recorded with Respiratory Inductance Plethysmography belts.   Oxygen saturation was recorded by pulse oximetry.  Sleep Architecture The total recording time of the polysomnogram was 441.7 minutes.  The total sleep time was 150.5 minutes.  The patient spent 6.3% of total sleep time in Stage N1, 62.8% in Stage N2, 0.0% in Stages N3, and 30.9% in REM.  Sleep latency was 126.7 minutes.   REM latency was 262.5 minutes.  Sleep Efficiency was 34.1%.  Wake after Sleep Onset time was 162.5 minutes.  Respiratory Events The polysomnogram revealed a presence of - obstructive, - central, and - mixed apneas resulting in an Apnea index of - events per hour.  There were 29 hypopneas (GreaterEqual to3% desaturation and/or arousal) resulting in an Apnea\Hypopnea Index (AHI  GreaterEqual to3% desaturation and/or arousal) of 11.6 events per hour.  There were 7 hypopneas (GreaterEqual to4% desaturation) resulting in an Apnea\Hypopnea Index (AHI GreaterEqual to4% desaturation) of 2.8 events per hour.  There were 6 Respiratory  Effort Related Arousals resulting in a RERA index of 2.4 events per hour. The Respiratory Disturbance Index is 14.0 events per hour.  The snore index was 187.0 events per hour.  Mean oxygen saturation was 89.6%.  The lowest oxygen saturation during sleep was 83.0%.  Time spent LessEqual to88% oxygen saturation was  minutes ().  Limb Activity There were 239 total limb movements recorded, of this total, 223 were classified as PLMs.  PLM index was 88.9 per hour and PLM associated with Arousals index was 33.1 per hour.  Cardiac Summary The average pulse rate was 75.3 bpm.  The minimum pulse rate was 63.0 bpm while the maximum pulse rate was 96.0 bpm.  Cardiac rhythm was normal/abnormal.  Comments:  Diagnosis:  Recommendations:   This study was  personally reviewed and electronically signed by: JENNET EPLEY, MD Accredited Board Certified in Sleep Medicine Date/Time:

## 2024-10-13 NOTE — Telephone Encounter (Signed)
 Patient is returning Ashylyn's call.  Please call her back at [phone].  Thanks.

## 2024-10-14 ENCOUNTER — Ambulatory Visit (INDEPENDENT_AMBULATORY_CARE_PROVIDER_SITE_OTHER): Admitting: Nurse Practitioner

## 2024-10-14 ENCOUNTER — Encounter (INDEPENDENT_AMBULATORY_CARE_PROVIDER_SITE_OTHER): Payer: Self-pay | Admitting: Nurse Practitioner

## 2024-10-14 VITALS — BP 164/99 | HR 95 | Temp 98.2°F | Ht 63.5 in | Wt 308.0 lb

## 2024-10-14 DIAGNOSIS — E039 Hypothyroidism, unspecified: Secondary | ICD-10-CM

## 2024-10-14 DIAGNOSIS — E66813 Obesity, class 3: Secondary | ICD-10-CM

## 2024-10-14 DIAGNOSIS — G4733 Obstructive sleep apnea (adult) (pediatric): Secondary | ICD-10-CM | POA: Diagnosis not present

## 2024-10-14 DIAGNOSIS — J449 Chronic obstructive pulmonary disease, unspecified: Secondary | ICD-10-CM | POA: Diagnosis not present

## 2024-10-14 DIAGNOSIS — E7849 Other hyperlipidemia: Secondary | ICD-10-CM | POA: Diagnosis not present

## 2024-10-14 DIAGNOSIS — Z6841 Body Mass Index (BMI) 40.0 and over, adult: Secondary | ICD-10-CM

## 2024-10-14 NOTE — Progress Notes (Signed)
 [address] [place], [place], [place] [address] Office: [phone]  /  Fax: [phone]   Initial Consultation    Pamela [name] was seen in clinic today to evaluate for obesity. She is interested in losing weight to improve overall health and reduce the risk of weight related complications. She presents today to review program treatment options, initial physical assessment, and evaluation.    Anthropometrics and Bioimpedance Analysis   Body mass index is 53.7 kg/m. Body Fat Mass : 57.7 % Visceral Fat Mass Rating : 24  Was having hernia repair 04/2023 and had to have lap band removed at that time. She has gained about 40 pounds since that time and has gained 15 pounds in the past 3 months and has stopped smoking ( 50 years 1 ppd)at that time as well. She has done the program before and continues to try to get 90 grams of protein daily.  Can now eat more food since she had to have lap band removed. She previously was in our program and she could not get past 270 pounds. She had a sleep study last Friday- she has severe sleep apnea and uses CPAP 100 % of the time. She does have COPD associated with a lot of shortness of breath with minimal exertion. She is currently on Breztri  160/9/4.8 mcg 2 puffs BID and Albuterol  inhaler or nebulizer q 6 hours as needed. She is on daily Xyzal  and Flonase .  She does have hypothyroidism and is currently on levothyroxine  150 mcg every day but has not had recent labwork.  She does need to use provigil  for daytime somnolence and does raise her BP initially. She does not have a diagnosis of hypertension.  Denies chest pain, dizziness and headaches.  BP Readings from Last 3 Encounters:  10/14/24 (!) 164/99  10/12/24 120/60  09/04/24 (!) 163/110   She does have hyperlipidemia and is currently on Atorvastatin  20 mg every day with last LDL not at goal at 114. Denies side effects from medication.   Obesity Related Diseases and Complications  Obesity Quality of  Life and Psychosocial Complications: Body image dissatisfaction, Reduced health-related quality of life, and Decrease physical activity and social participation  Cardiometabolic: Prediabetes and/or insulin  resistance, Dyslipidemia or hypercholesterolemia, DOE, and Fatigue  Biomechanical: Obstructive sleep apnea, GERD, Impaired mobility and gait dysfunction, and COPD   Weight Related History  She was referred by: Specialist  When asked what they would like to accomplish? She states: Adopt a healthier eating pattern and lifestyle, Improve energy levels and physical activity, Improve existing medical conditions, Improve quality of life, Improve appearance, and Improve self-confidence  Weight history: Heavy as a small child thinned out in adolescence, started gradual weight gain from 34 on  Highest weight: 308  Contributing factors: family history of obesity, disruption of circadian rhythm / sleep disordered breathing, consumption of processed foods, use of obesogenic medications: Psychotropic medications and Anticholinergics, moderate to high levels of stress, reduced physical activity, chronic skipping of meals, mental health problems, menopause, slow metabolism for age, need for convenience due to lack of time, history of metabolic surgery, sedentary job, hectic pace of life, and need for convenient foods  Prior weight loss attempts: Balanced Plate / Portion Control and High Protein  Current or previous pharmacotherapy: GLP-1  Response to medication: Was cost prohibitive or lost coverage for AOM, lost approximately 30 pounds  Current nutrition plan: High-protein  Greatest challenge with dieting: difficulty maintaining reduced calorie state and meal preparation and cooking.  Current level of  physical activity: Swimming 2-3 times a week for 45 minutes  Barriers to Exercise: COPD  Readiness and Motivation  On a scale from 0 to 10 How ready are you to make changes to your eating and  physical activity to lose weight? 10 How important is it for you to lose weight right now ? 10 How confident are you that you can lose weight if you try? 10  Past Medical History   Past Medical History:  Diagnosis Date   Anemia    Asthma    Colon polyps    COPD (chronic obstructive pulmonary disease) (HCC)    Depression    GERD (gastroesophageal reflux disease)    Heart murmur    childhood   History of alcohol abuse    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)   History of blood transfusion 2006   HLD (hyperlipidemia)    Hypothyroidism    Obesity    Sleep apnea    cpap   Thyroid  disease      Objective    BP (!) 164/99   Pulse 95   Temp 98.2 F (36.8 C)   Ht 5' 3.5 (1.613 m)   Wt (!) 308 lb (139.7 kg)   SpO2 96%   BMI 53.70 kg/m  She was weighed on the bioimpedance scale: Body mass index is 53.7 kg/m.    General:  Alert, oriented and cooperative. Patient is in no acute distress.  Respiratory: Normal respiratory effort, no problems with respiration noted   Gait: able to ambulate independently  Mental Status: Normal mood and affect. Normal behavior. Normal judgment and thought content.   Diagnostic Data Reviewed  BMET    Component Value Date/Time   NA 140 03/30/2024 1017   NA 139 07/07/2020 1329   K 4.3 03/30/2024 1017   CL 101 03/30/2024 1017   CO2 31 03/30/2024 1017   GLUCOSE 105 (H) 03/30/2024 1017   BUN 12 03/30/2024 1017   BUN 9 07/07/2020 1329   CREATININE 0.55 03/30/2024 1017   CALCIUM  9.4 03/30/2024 1017   GFRNONAA >60 05/05/2023 0049   GFRAA 113 07/07/2020 1329   Lab Results  Component Value Date   HGBA1C 6.2 03/31/2024   HGBA1C 6.0 (H) 06/23/2018   Lab Results  Component Value Date   INSULIN  10.5 07/07/2020   INSULIN  10.9 06/05/2018   CBC    Component Value Date/Time   WBC 11.9 (H) 10/12/2024 1344   RBC 5.12 (H) 10/12/2024 1344   HGB 14.3 10/12/2024 1344   HGB 15.2 06/05/2018 0937   HCT 44.2 10/12/2024 1344   HCT 45.3 06/05/2018 0937    PLT 365.0 10/12/2024 1344   MCV 86.4 10/12/2024 1344   MCV 87 06/05/2018 0937   MCH 29.5 05/05/2023 0049   MCHC 32.4 10/12/2024 1344   RDW 14.5 10/12/2024 1344   RDW 15.2 06/05/2018 0937   Iron/TIBC/Ferritin/ %Sat No results found for: IRON, TIBC, FERRITIN, IRONPCTSAT Lipid Panel     Component Value Date/Time   CHOL 199 03/30/2024 1017   CHOL 184 07/07/2020 1329   TRIG 89.0 03/30/2024 1017   HDL 67.40 03/30/2024 1017   HDL 55 07/07/2020 1329   CHOLHDL 3 03/30/2024 1017   VLDL 17.8 03/30/2024 1017   LDLCALC 114 (H) 03/30/2024 1017   LDLCALC 109 (H) 07/07/2020 1329   Hepatic Function Panel     Component Value Date/Time   PROT 6.9 03/30/2024 1017   PROT 6.8 07/07/2020 1329   ALBUMIN  4.2 03/30/2024 1017  ALBUMIN  4.2 07/07/2020 1329   AST 17 03/30/2024 1017   ALT 17 03/30/2024 1017   ALKPHOS 108 03/30/2024 1017   BILITOT 0.5 03/30/2024 1017   BILITOT 0.4 07/07/2020 1329      Component Value Date/Time   TSH 0.21 (L) 08/24/2022 1404    Medications  Outpatient Encounter Medications as of 10/14/2024  Medication Sig   acetaminophen  (TYLENOL ) 500 MG tablet Take 1,000 mg by mouth daily as needed for moderate pain.   albuterol  (PROVENTIL ) (2.5 MG/3ML) 0.083% nebulizer solution Take 3 mLs (2.5 mg total) by nebulization every 6 (six) hours as needed for wheezing or shortness of breath.   albuterol  (VENTOLIN  HFA) 108 (90 Base) MCG/ACT inhaler Inhale 1-2 puffs into the lungs every 6 (six) hours as needed for wheezing or shortness of breath.   amitriptyline  (ELAVIL ) 10 MG tablet Take 1 tablet (10 mg total) by mouth at bedtime.   aspirin  EC 81 MG tablet Take 81 mg by mouth daily. Swallow whole.   atorvastatin  (LIPITOR) 20 MG tablet Take 1 tablet (20 mg total) by mouth daily.   budesonide -glycopyrrolate -formoterol  (BREZTRI  AEROSPHERE) 160-9-4.8 MCG/ACT AERO inhaler Inhale 2 puffs into the lungs 2 (two) times daily. Rinse mouth after use   busPIRone  (BUSPAR ) 10 MG tablet  Take 20 mg by mouth 3 (three) times daily.   chlorhexidine  (PERIDEX ) 0.12 % solution Use as directed 15 mLs in the mouth or throat 2 (two) times daily.   clobetasol  (TEMOVATE ) 0.05 % external solution Apply 1 Application topically 2 (two) times daily as needed (irritation).   dicyclomine  (BENTYL ) 10 MG capsule Take 1 capsule (10 mg total) by mouth every 6 (six) hours as needed for spasms.   fluticasone  (FLONASE ) 50 MCG/ACT nasal spray Place 2 sprays into both nostrils 2 (two) times daily.   hydrOXYzine  (VISTARIL ) 50 MG capsule Take 50 mg by mouth 4 (four) times daily as needed.   ketoconazole  (NIZORAL ) 2 % cream APPLY TO AREA ON ARM DAILY FOR 2 WEEKS   levocetirizine (XYZAL  ALLERGY 24HR) 5 MG tablet Take 1 tablet (5 mg total) by mouth every evening.   levothyroxine  (SYNTHROID ) 150 MCG tablet Take 1 tablet (150 mcg total) by mouth daily before breakfast.   Melatonin 10 MG CAPS Take 10 mg by mouth at bedtime.   modafinil  (PROVIGIL ) 200 MG tablet Take 1 tablet (200 mg total) by mouth in the morning.   Multiple Vitamin (MULTIVITAMIN WITH MINERALS) TABS tablet Take 1 tablet by mouth daily.   omeprazole  (PRILOSEC) 40 MG capsule TAKE 1 CAPSULE(40 MG) BY MOUTH DAILY AS NEEDED FOR HEARTBURN   sertraline (ZOLOFT) 100 MG tablet Take 100 mg by mouth daily.   No facility-administered encounter medications on file as of 10/14/2024.     Assessment and Plan   Other hyperlipidemia Continue atorvastatin  20 mg qd and follow closely with PCP Loss of 10-15% body weight can improve lipid levels  Hypothyroidism, unspecified type       Continue Levothyroxine  150 mcg every day       Continue to follow with PCP  Chronic obstructive pulmonary disease, unspecified COPD type (HCC)      Continue to follow with pulmonology       Continue Breztri  and Albuterol   OSA (obstructive sleep apnea)       Use CPAP 100% of the time       Decreasing body weight by 10-15% can improve AHI   Class 3 severe obesity with  serious comorbidity and body mass index (BMI) of 50.0  to 59.9 in adult, unspecified obesity type (HCC)  Obesity Treatment and Action Plan:  Patient will work on garnering support from family and friends to begin weight loss journey. Will work on eliminating or reducing the presence of highly palatable, calorie dense foods in the home. Will complete provided nutritional and psychosocial assessment questionnaire before the next appointment. Will be scheduled for indirect calorimetry to determine resting energy expenditure in a fasting state.  This will allow us  to create a reduced calorie, high-protein meal plan to promote loss of fat mass while preserving muscle mass. Counseled on the health benefits of losing 5%-15% of total body weight. Was counseled on nutritional approaches to weight loss and benefits of reducing processed foods and consuming plant-based foods and high quality protein as part of nutritional weight management. Was counseled on pharmacotherapy and role as an adjunct in weight management.   Education and Additional resources  She was weighed on the bioimpedance scale and results were discussed and documented in the synopsis.  We discussed obesity as a progressive, chronic disease and the importance of a more detailed evaluation of all the factors contributing to the disease.  We reviewed the basic principles in obesity management.   We discussed the importance of long term lifestyle changes which include nutrition, exercise and behavioral modification as well as the importance of customizing this to her specific health and social needs.  We reviewed the role of medical interventions including pharmacotherapy and surgical interventions.   We discussed the benefits of reaching a healthier weight to alleviate the symptoms of existing conditions and reduce the risks of the biomechanical, cardiometabolic and psychological effects of obesity.  We reviewed our program approach and  philosophy, which are guided by the four pillars of obesity medicine.  We discussed how to prepare for intake appointment and the importance of fasting and avoidance of stimulants for at least 8 hours prior to indirect calorimetry.  Pamela [name] appears to be in the action stage of change and reports being ready to initiate intensive lifestyle and behavioral modifications as part of their weight loss journey.  Attestation  Reviewed by clinician on day of visit: allergies, medications, problem list, medical history, surgical history, family history, social history, and previous encounter notes pertinent to obesity diagnosis.  I personally spent a total of 30 minutes in the care of the patient today including preparing to see the patient, getting/reviewing separately obtained history, performing a medically appropriate exam/evaluation, counseling and educating, and documenting clinical information in the EHR.   Azaryah Heathcock ANP-C

## 2024-10-16 ENCOUNTER — Telehealth: Payer: Self-pay

## 2024-10-16 LAB — ALLERGEN PROFILE, PERENNIAL ALLERGEN IGE

## 2024-10-16 NOTE — Telephone Encounter (Signed)
 Received referral for new start Ohtuvayre . Opening benefits investigation in this thread.

## 2024-10-16 NOTE — Telephone Encounter (Signed)
 Received Ohtuvayre  new start paperwork. Completed form and faxed with clinicals and insurance card copy to [facility]   Phone#: [phone] Fax#: [phone]

## 2024-10-18 NOTE — Telephone Encounter (Signed)
 Received fax from [employer] confirming receipt of enrollment form   ID: [phone]

## 2024-10-21 ENCOUNTER — Other Ambulatory Visit (HOSPITAL_COMMUNITY): Payer: Self-pay

## 2024-10-21 NOTE — Telephone Encounter (Signed)
 Received fax from [employer] with summary of benefits. Referral form for Ohtuvayre  received. Rx will be triaged to [facility] (phone: [phone]). Once benefits investigation completed, pharmacy will reach out the patient to schedule shipment. If medication is unaffordable, patient will need to express financial hardship to be referred back to [facility] for patient assistance program pre-screening.   Patient ID: [id number] Pharmacy phone: [facility] (phone: [phone]) [facility] Phone#: [phone]

## 2024-10-21 NOTE — Telephone Encounter (Signed)
 Received notification from [facility] regarding a prior authorization for OHTUVAYRE . Authorization has been APPROVED from 10/21/24 to 10/21/25. Approval letter sent to scan center.  Per test claim, copay for 30 days supply is $558.63  Authorization # [id number] Phone # [phone]

## 2024-10-21 NOTE — Telephone Encounter (Signed)
 Received fax from [facility] stating a pa has been started for Ohtuvayre . Submitted a Prior Authorization request to [facility] for OHTUVAYRE  via CoverMyMeds. Will update once we receive a response.  Key: [id number]

## 2024-10-27 ENCOUNTER — Encounter: Payer: Self-pay | Admitting: Family Medicine

## 2024-10-27 NOTE — Telephone Encounter (Signed)
 Received fax from [facility] confirming receipt of prescription.

## 2024-10-28 ENCOUNTER — Other Ambulatory Visit: Payer: Self-pay | Admitting: Family Medicine

## 2024-10-28 ENCOUNTER — Other Ambulatory Visit: Payer: Self-pay

## 2024-10-28 ENCOUNTER — Other Ambulatory Visit (HOSPITAL_BASED_OUTPATIENT_CLINIC_OR_DEPARTMENT_OTHER): Payer: Self-pay

## 2024-10-28 MED ORDER — CLOBETASOL PROPIONATE 0.05 % EX SOLN
1.0000 | Freq: Two times a day (BID) | CUTANEOUS | 1 refills | Status: AC | PRN
  Filled 2024-10-29 – 2024-11-29 (×2): qty 50, 25d supply, fill #0

## 2024-10-28 MED ORDER — MODAFINIL 200 MG PO TABS: 200.0000 mg | ORAL_TABLET | Freq: Every morning | ORAL | 1 refills | Status: AC

## 2024-10-28 MED ORDER — MODAFINIL 200 MG PO TABS: 200.0000 mg | ORAL_TABLET | Freq: Every morning | ORAL | 1 refills | Status: DC

## 2024-10-28 MED ORDER — CLOBETASOL PROPIONATE 0.05 % EX SOLN
1.0000 | Freq: Two times a day (BID) | CUTANEOUS | 1 refills | Status: DC | PRN
  Filled 2024-10-28: qty 50, 25d supply, fill #0

## 2024-10-29 ENCOUNTER — Encounter (HOSPITAL_COMMUNITY): Payer: Self-pay | Admitting: Surgery

## 2024-10-29 ENCOUNTER — Other Ambulatory Visit: Payer: Self-pay

## 2024-10-30 ENCOUNTER — Other Ambulatory Visit: Payer: Self-pay

## 2024-11-04 ENCOUNTER — Ambulatory Visit (INDEPENDENT_AMBULATORY_CARE_PROVIDER_SITE_OTHER): Admitting: Nurse Practitioner

## 2024-11-04 ENCOUNTER — Encounter (INDEPENDENT_AMBULATORY_CARE_PROVIDER_SITE_OTHER): Payer: Self-pay | Admitting: Nurse Practitioner

## 2024-11-04 VITALS — BP 143/88 | HR 88 | Temp 98.5°F | Ht 63.0 in | Wt 303.0 lb

## 2024-11-04 DIAGNOSIS — Z1331 Encounter for screening for depression: Secondary | ICD-10-CM | POA: Diagnosis not present

## 2024-11-04 DIAGNOSIS — Z6841 Body Mass Index (BMI) 40.0 and over, adult: Secondary | ICD-10-CM

## 2024-11-04 DIAGNOSIS — G4733 Obstructive sleep apnea (adult) (pediatric): Secondary | ICD-10-CM

## 2024-11-04 DIAGNOSIS — E559 Vitamin D deficiency, unspecified: Secondary | ICD-10-CM

## 2024-11-04 DIAGNOSIS — J449 Chronic obstructive pulmonary disease, unspecified: Secondary | ICD-10-CM | POA: Diagnosis not present

## 2024-11-04 DIAGNOSIS — E66813 Obesity, class 3: Secondary | ICD-10-CM | POA: Diagnosis not present

## 2024-11-04 DIAGNOSIS — E039 Hypothyroidism, unspecified: Secondary | ICD-10-CM

## 2024-11-04 DIAGNOSIS — R0602 Shortness of breath: Secondary | ICD-10-CM

## 2024-11-04 DIAGNOSIS — Z87891 Personal history of nicotine dependence: Secondary | ICD-10-CM | POA: Diagnosis not present

## 2024-11-04 DIAGNOSIS — R5383 Other fatigue: Secondary | ICD-10-CM

## 2024-11-04 DIAGNOSIS — E7849 Other hyperlipidemia: Secondary | ICD-10-CM

## 2024-11-04 NOTE — Progress Notes (Signed)
 [address] [place],  [place], [place] [address]  Office: [phone]  /  Fax: [phone]   Subjective   Initial Visit  Pamela [name] (MR# [id number]) is a 63 y.o. female who presents for evaluation and treatment of obesity and related comorbidities. Current BMI is Body mass index is 53.67 kg/m. Pamela [name] has been struggling with her weight for many years and has been unsuccessful in either losing weight, maintaining weight loss, or reaching her healthy weight goal.  Pamela [name] is currently in the action stage of change and ready to dedicate time achieving and maintaining a healthier weight. Pamela [name] is interested in becoming our patient and working on intensive lifestyle modifications including (but not limited to) diet and exercise for weight loss.  Pamela [name] [name] having a hernia repair 04/2023 and had to have lap band removed at that time. Pamela [name] has gained about 40 pounds since that time and has gained 15 pounds in the past 3 months and has stopped smoking ( 50 years 1 ppd)at that time as well. Pamela [name] has done the program before and continues to try to get 90 grams of protein daily.  Can now eat more food since Pamela [name] had to have lap band removed. Pamela [name] previously [name] in our program and Pamela [name] could not get past 270 pounds. Pamela [name] dooes swim 45-60 minutes 3 times a week Pamela [name] had a sleep study last month- Pamela [name] has severe sleep apnea and uses CPAP 100 % of the time. Pamela [name] does have COPD and follows with pulmonology- Pamela [name] has associated shortness of breath with minimal exertion. Pamela [name] is currently on Breztri  160/9/4.8 mcg 2 puffs BID and Albuterol  inhaler or nebulizer q 6 hours as needed. Pamela [name] is on daily Xyzal  and Flonase .  Pamela [name] has had recent PFT's and is to meet with Pulmonologist next week regarding possible O2 therapy and further treatment for COPD.  Pamela [name] does have hypothyroidism and is currently on levothyroxine  150 mcg every day but has not had recent labwork.    Pamela [name] does need to use provigil  for daytime somnolence and does  raise her BP initially.   Pamela [name] does not have a diagnosis of hypertension but BP is elevated today. Pamela [name] states her BP raises with use of Provigil . Denies chest pain, dizziness and headaches BP Readings from Last 3 Encounters:  11/04/24 (!) 143/88  10/14/24 (!) 164/99  10/12/24 120/60   Pamela [name] does have hyperlipidemia and does take atorvastatin  20 mg every day without side effects Lab Results  Component Value Date   CHOL 199 03/30/2024   HDL 67.40 03/30/2024   LDLCALC 114 (H) 03/30/2024   TRIG 89.0 03/30/2024   CHOLHDL 3 03/30/2024     Weight history:  When asked how their weight has affected their life and health, Pamela [name] states: Contributed to medical problems, Having fatigue, Having poor endurance, and Has affected mood   When asked what else they would like to accomplish? Pamela [name] states: Adopt a healthier eating pattern and lifestyle, Improve energy levels and physical activity, Improve existing medical conditions, Improve quality of life, Improve appearance, and Improve self-confidence  Pamela [name] starting to note weight gain during : childhood and adulthood.  Life events associated with weight gain include : pregnancy and starting a medication levothyroxine .   Other contributing factors: family history of obesity, disruption of circadian rhythm / sleep disordered breathing, consumption of processed foods, use of obesogenic medications: Psychotropic medications, moderate to high levels of stress, reduced physical activity, chronic skipping of meals, mental health problems, menopause, need for convenience due to  lack of time, history of metabolic surgery, sedentary job, hectic pace of life, and need for convenient foods.  Their highest weight has been:  308 lbs.  Desired weight: 175  Previous weight-loss programs : Balanced Plate / Portion Control and High Protein.  Their maximum weight loss [name]:  30 lbs.  Their greatest challenge with dieting: difficulty maintaining reduced calorie state and  meal preparation and cooking.  Current or previous pharmacotherapy: GLP-1.  Response to medication: [name] cost prohibitive or lost coverage for AOM   Nutritional History:  Current nutrition plan: High-protein.  How many times do you eat outside the home: 5-7 per week  How often do they skip meals: skips lunch 2-3 times a week  What beverages do they drink: water , caffeinated beverages , unsweetened tea, and milk.   Use of artificial sweetners : Yes  Food intolerances or dislikes: raw fish.  Food triggers: Boredom and To help comfort self.  Food cravings: Sugary  Do they struggle with excessive hunger or portion control : Yes    Physical Activity:  Current level of physical activity: Other: swimming 45-60 minutes three times a week  Barriers to Exercise: breathing/COPD- severe dyspnea   Past medical history includes:   Past Medical History:  Diagnosis Date   Alcohol abuse    Anemia    Anxiety    Asthma    B12 deficiency    Back pain    Colon polyps    Constipation    COPD (chronic obstructive pulmonary disease) (HCC)    Depression    GERD (gastroesophageal reflux disease)    Heart murmur    childhood   Heartburn    History of alcohol abuse    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)   History of blood transfusion 2006   HLD (hyperlipidemia)    Hypothyroidism    Obesity    Pre-diabetes    Sleep apnea    cpap   Thyroid  disease      Objective   BP (!) 143/88   Pulse 88   Temp 98.5 F (36.9 C)   Ht 5' 3 (1.6 m)   Wt (!) 303 lb (137.4 kg)   SpO2 97%   BMI 53.67 kg/m  Pamela [name] weighed on the bioimpedance scale: Body mass index is 53.67 kg/m.    Anthropometrics:  Vitals Temp: 98.5 F (36.9 C) BP: (!) 143/88 Pulse Rate: 88 SpO2: 97 %   Anthropometric Measurements Height: 5' 3 (1.6 m) Weight: (!) 303 lb (137.4 kg) BMI (Calculated): 53.69 Weight at Last Visit: N/a Weight Lost Since Last Visit: N/a Weight Gained Since Last Visit: N/a Starting  Weight: 303lb Total Weight Loss (lbs): 0 lb (0 kg) Peak Weight: 310lb   Body Composition  Body Fat %: 56.2 % Fat Mass (lbs): 170.6 lbs Muscle Mass (lbs): 126.2 lbs Total Body Water  (lbs): 89 lbs Visceral Fat Rating : 24   Other Clinical Data RMR: [id number] Fasting: Yes Labs: Yes Today's Visit #: 1 Starting Date: 11/04/24    Physical Exam:  General: Pamela [name] is overweight, cooperative, alert, well developed, and in no acute distress. PSYCH: Has normal mood, affect and thought process.   HEENT: EOMI, sclerae are anicteric. Lungs: dyspnea with walking up the hall Extremities: No edema.  Neurologic: No gross sensory or motor deficits. No tremors or fasciculations noted.    Diagnostic Data Reviewed  EKG: Normal sinus rhythm, rate 83. No change compared to 07/13/19 EKG  Indirect Calorimeter completed today shows a VO2  of 370 and a REE of 2549.  Her calculated basal metabolic rate is 8044 thus her resting energy expenditure faster than calculated. Pamela [name] breathing very fast during the test.   Depression Screen  Pamela [name]'s PHQ-9 score [name]: 15- Pamela [name] is currently on Sertraline 100 mg every day, Buspar  20 mg TID, Hydroxyzine  50 mg QID as needed and elavil  10 mg QHS. Pamela [name] follows regularly with her PCP who monitors medication.      11/04/2024   10:33 AM  Depression screen PHQ 2/9  Decreased Interest 2  Down, Depressed, Hopeless 2  PHQ - 2 Score 4  Altered sleeping 2  Tired, decreased energy 2  Change in appetite 2  Feeling bad or failure about yourself  2  Trouble concentrating 2  Moving slowly or fidgety/restless 1  Suicidal thoughts 0  PHQ-9 Score 15  Difficult doing work/chores Somewhat difficult    Screening for Sleep Related Breathing Disorders  Pamela [name] admits to daytime somnolence and admits to waking up still tired. Patient has a history of symptoms of daytime fatigue, morning fatigue, morning headache, and sleep apnea with CPAP. Pamela [name] generally gets 5 or 6 hours of sleep  per night, and states that Pamela [name] has difficulty falling asleep, nightime awakenings, and difficulty falling back asleep if awakened. Snoring is present. Apneic episodes are present.( Snoring and apnea are not present when using CPAP which Pamela [name] uses 100% of the time) Epworth Sleepiness Score is 7.   Last metabolic panel Lab Results  Component Value Date   GLUCOSE 105 (H) 03/30/2024   NA 140 03/30/2024   K 4.3 03/30/2024   CL 101 03/30/2024   CO2 31 03/30/2024   BUN 12 03/30/2024   CREATININE 0.55 03/30/2024   GFR 97.31 03/30/2024   CALCIUM  9.4 03/30/2024   PHOS 5.1 (H) 05/05/2023   PROT 6.9 03/30/2024   ALBUMIN  4.2 03/30/2024   LABGLOB 2.6 07/07/2020   AGRATIO 1.6 07/07/2020   BILITOT 0.5 03/30/2024   ALKPHOS 108 03/30/2024   AST 17 03/30/2024   ALT 17 03/30/2024   ANIONGAP 9 05/05/2023     Lab Results  Component Value Date   HGBA1C 6.2 03/31/2024   HGBA1C 6.0 (H) 06/23/2018   Lab Results  Component Value Date   INSULIN  10.5 07/07/2020   INSULIN  10.9 06/05/2018   Lab Results  Component Value Date   TSH 0.21 (L) 08/24/2022   T3TOTAL 124 06/05/2018   FREET4 1.34 08/24/2022    CBC    Component Value Date/Time   WBC 11.9 (H) 10/12/2024 1344   RBC 5.12 (H) 10/12/2024 1344   HGB 14.3 10/12/2024 1344   HGB 15.2 06/05/2018 0937   HCT 44.2 10/12/2024 1344   HCT 45.3 06/05/2018 0937   PLT 365.0 10/12/2024 1344   MCV 86.4 10/12/2024 1344   MCV 87 06/05/2018 0937   MCH 29.5 05/05/2023 0049   MCHC 32.4 10/12/2024 1344   RDW 14.5 10/12/2024 1344   RDW 15.2 06/05/2018 0937    Lipid Panel  Lab Results  Component Value Date   CHOL 199 03/30/2024   HDL 67.40 03/30/2024   LDLCALC 114 (H) 03/30/2024   TRIG 89.0 03/30/2024   CHOLHDL 3 03/30/2024    Last vitamin D  Lab Results  Component Value Date   VD25OH 24.73 (L) 08/24/2022    Lab Results  Component Value Date   VITAMINB12 477 06/05/2018        Assessment and Plan   TREATMENT PLAN FOR  OBESITY:  Recommended Dietary Goals  Klyn is currently in the action stage of change. As such, her goal is to implement medically supervised obesity management plan.  Pamela [name] has agreed to implement: the Category 3 plan - 1500 kcal per day  Behavioral Intervention  We discussed the following Behavioral Modification Strategies today: increasing lean protein intake to established goals, decreasing simple carbohydrates , increasing vegetables, increasing lower glycemic fruits, increasing fiber rich foods, avoiding skipping meals, increasing water  intake, work on meal planning and preparation, reading food labels , keeping healthy foods at home, identifying sources and decreasing liquid calories, decreasing eating out or consumption of processed foods, and making healthy choices when eating convenient foods, planning for success, and better snacking choices  Additional resources provided today: Handout on healthy eating and balanced plate, Handout on complex carbohydrates and lean sources of protein, Category 3 packet, and Handout principles of weight management  Recommended Physical Activity Goals  Pamela [name] has been advised to work up to 150 minutes of moderate intensity aerobic activity a week and strengthening exercises 2-3 times per week for cardiovascular health, weight loss maintenance and preservation of muscle mass.   Pamela [name] has agreed to :  Continue current level of physical activity  and Increase physical activity in their day and reduce sedentary time (increase NEAT).  Medical Interventions and Pharmacotherapy We will work on building a therapist, art and behavioral strategies. We will discuss the role of pharmacotherapy as an adjunct at subsequent visits.   ASSOCIATED CONDITIONS ADDRESSED TODAY  Other Fatigue Pamela [name] does feel that her weight is causing her energy to be lower than it should be. Fatigue may be related to obesity, depression or many other causes. Labs  will be ordered, and in the meanwhile, Pamela [name] will focus on self care including making healthy food choices, increasing physical activity and focusing on stress reduction.  Shortness of Breath with exertion Kilea notes increasing shortness of breath with physical activity and seems to be worsening over time with weight gain. Pamela [name] notes getting out of breath sooner with activity than Pamela [name] used to. This has not gotten worse recently. Pamela [name] denies shortness of breath at rest or orthopnea.  Depression screening Pamela [name] scored a 15 on her PHQ 9.  Pamela [name] is currently on medication and is followed by her PCP. No further intervention is currently needed.   OSA (obstructive sleep apnea)     Continue 100% use of CPAP     Loss of 10-15% body weight can help improve OSA  -     CBC with Differential/Platelet -     Comprehensive metabolic panel with GFR  Class 3 severe obesity with serious comorbidity and body mass index (BMI) of 50.0 to 59.9 in adult, unspecified obesity type (HCC) -     CBC with Differential/Platelet -     Comprehensive metabolic panel with GFR -     Hemoglobin A1c -     Insulin , random -     Lipid panel -     Magnesium  -     TSH -     Vitamin B12 -     VITAMIN D  25 Hydroxy (Vit-D Deficiency, Fractures)  Chronic obstructive pulmonary disease, unspecified COPD type (HCC) Continue Breztri  2 puffs BID and albuterol  1-2 puffs q 6 hours PRN Continue to follow closely with Pulmonologist.  Has upcoming appointment to discuss treatment options for her COPD which does cause dyspnea with minimal exertion -     CBC with Differential/Platelet -     Comprehensive metabolic panel with GFR  Hypothyroidism, unspecified type Continue levothyroxine  150 mcg daily and follow closely with PCP -     Insulin , random  Other hyperlipidemia Focus on implementing category 3 meal plan, limit saturated fats, increase lean protein/water delvin Loss of 10-15% body weight can improve lipid levels Continue  Atorvastatin  20 mg every day Continue to follow regularly with PCP Continue exercise of swimming 45-60 minutes 3 times a week, activity is limited due to COPD -     Lipid panel  Vitamin D  deficiency If Vit D level remains low will supplement with Ergocalciferol  50000 units once a week for 12 weeks and then recheck level.   -     VITAMIN D  25 Hydroxy (Vit-D Deficiency, Fractures)    Follow-up  Pamela [name] informed of the importance of frequent follow-up visits to maximize her success with intensive lifestyle modifications for her multiple health conditions. Pamela [name] informed we would discuss her lab results at her next visit unless there is a critical issue that needs to be addressed sooner. Pamela [name] agreed to keep her next visit at the agreed upon time to discuss these results.  Attestation Statement  This is the patient's intake visit at [facility]. The patient's Health Questionnaire [name] reviewed at length. Included in the packet: current and past health history, medications, allergies, ROS, gynecologic history (women only), surgical history, family history, social history, weight history, weight loss surgery history (for those that have had weight loss surgery), nutritional evaluation, mood and food questionnaire, PHQ9, Epworth questionnaire, sleep habits questionnaire, patient life and health improvement goals questionnaire. These will all be scanned into the patient's chart under media.   During the visit, I independently reviewed the patient's EKG, previous labs, bioimpedance scale results, and indirect calorimetry results. I used this information to medically tailor a meal plan for the patient that will help her to lose weight and will improve her obesity-related conditions. I performed a medically necessary appropriate examination and/or evaluation. I discussed the assessment and treatment plan with the patient. The patient [name] provided an opportunity to ask questions and all  were answered. The patient agreed with the plan and demonstrated an understanding of the instructions. Labs were ordered at this visit and will be reviewed at the next visit unless critical results need to be addressed immediately. Clinical information [name] updated and documented in the EMR.   In addition, they received basic education on identification of processed foods and reduction of these, different sources of lean proteins and complex carbohydrates and how to eat balanced by incorporation of whole foods.  Reviewed by clinician on day of visit: allergies, medications, problem list, medical history, surgical history, family history, social history, and previous encounter notes.  I personally spent a total of 44 minutes in the care of the patient today including preparing to see the patient, getting/reviewing separately obtained history, performing a medically appropriate exam/evaluation, counseling and educating, placing orders, and documenting clinical information in the EHR. Does not include time for indirect calorimetry, EKG or depression screening.    Kaisyn Millea ANP-C

## 2024-11-05 ENCOUNTER — Ambulatory Visit (INDEPENDENT_AMBULATORY_CARE_PROVIDER_SITE_OTHER): Payer: Self-pay | Admitting: Nurse Practitioner

## 2024-11-05 ENCOUNTER — Other Ambulatory Visit: Payer: Self-pay

## 2024-11-05 LAB — LIPID PANEL
Chol/HDL Ratio: 2.6 ratio (ref 0.0–4.4)
Cholesterol, Total: 205 mg/dL — ABNORMAL HIGH (ref 100–199)
HDL: 79 mg/dL (ref >39)
LDL Chol Calc (NIH): 112 mg/dL — ABNORMAL HIGH (ref 0–99)
Triglycerides: 80 mg/dL (ref 0–149)
VLDL Cholesterol Cal: 14 mg/dL (ref 5–40)

## 2024-11-05 LAB — COMPREHENSIVE METABOLIC PANEL WITH GFR
ALT: 21 IU/L (ref 0–32)
AST: 19 IU/L (ref 0–40)
Albumin: 4.2 g/dL (ref 3.9–4.9)
Alkaline Phosphatase: 121 IU/L (ref 49–135)
BUN/Creatinine Ratio: 18 (ref 12–28)
BUN: 11 mg/dL (ref 8–27)
Bilirubin Total: 0.4 mg/dL (ref 0.0–1.2)
CO2: 25 mmol/L (ref 20–29)
Calcium: 9.3 mg/dL (ref 8.7–10.3)
Chloride: 100 mmol/L (ref 96–106)
Creatinine, Ser: 0.62 mg/dL (ref 0.57–1.00)
Globulin, Total: 2.5 g/dL (ref 1.5–4.5)
Glucose: 95 mg/dL (ref 70–99)
Potassium: 4.5 mmol/L (ref 3.5–5.2)
Sodium: 142 mmol/L (ref 134–144)
Total Protein: 6.7 g/dL (ref 6.0–8.5)
eGFR: 99 mL/min/1.73 (ref >59)

## 2024-11-05 LAB — CBC WITH DIFFERENTIAL/PLATELET
Basophils Absolute: 0.1 x10E3/uL (ref 0.0–0.2)
Basos: 1 %
EOS (ABSOLUTE): 0.2 x10E3/uL (ref 0.0–0.4)
Eos: 2 %
Hematocrit: 46.4 % (ref 34.0–46.6)
Hemoglobin: 14.8 g/dL (ref 11.1–15.9)
Immature Grans (Abs): 0 x10E3/uL (ref 0.0–0.1)
Immature Granulocytes: 0 %
Lymphocytes Absolute: 1.7 x10E3/uL (ref 0.7–3.1)
Lymphs: 23 %
MCH: 27.9 pg (ref 26.6–33.0)
MCHC: 31.9 g/dL (ref 31.5–35.7)
MCV: 87 fL (ref 79–97)
Monocytes Absolute: 0.5 x10E3/uL (ref 0.1–0.9)
Monocytes: 7 %
Neutrophils Absolute: 5.1 x10E3/uL (ref 1.4–7.0)
Neutrophils: 67 %
Platelets: 411 x10E3/uL (ref 150–450)
RBC: 5.31 x10E6/uL — ABNORMAL HIGH (ref 3.77–5.28)
RDW: 13.2 % (ref 11.7–15.4)
WBC: 7.6 x10E3/uL (ref 3.4–10.8)

## 2024-11-05 LAB — VITAMIN B12: Vitamin B-12: 384 pg/mL (ref 232–1245)

## 2024-11-05 LAB — TSH: TSH: 1.77 u[IU]/mL (ref 0.450–4.500)

## 2024-11-05 LAB — HEMOGLOBIN A1C
Est. average glucose Bld gHb Est-mCnc: 137 mg/dL
Hgb A1c MFr Bld: 6.4 % — ABNORMAL HIGH (ref 4.8–5.6)

## 2024-11-05 LAB — VITAMIN D 25 HYDROXY (VIT D DEFICIENCY, FRACTURES): Vit D, 25-Hydroxy: 24 ng/mL — ABNORMAL LOW (ref 30.0–100.0)

## 2024-11-05 LAB — INSULIN, RANDOM: INSULIN: 17 u[IU]/mL (ref 2.6–24.9)

## 2024-11-05 LAB — MAGNESIUM: Magnesium: 2 mg/dL (ref 1.6–2.3)

## 2024-11-06 ENCOUNTER — Other Ambulatory Visit (HOSPITAL_COMMUNITY): Payer: Self-pay

## 2024-11-06 ENCOUNTER — Telehealth: Payer: Self-pay

## 2024-11-06 NOTE — Telephone Encounter (Signed)
 Pharmacy Patient Advocate Encounter  Received notification from [facility] that Prior Authorization for Modafinil  200mg  tabs has been APPROVED from 11/06/24 to 11/06/25   PA #/Case ID/Reference #: [id number]

## 2024-11-06 NOTE — Telephone Encounter (Signed)
 Pharmacy Patient Advocate Encounter   Received notification from Patient Advice Request messages that prior authorization for Modafinil  200mg  tabs is required/requested.   Insurance verification completed.   The patient is insured through [facility].   Per test claim: PA required; PA submitted to above mentioned insurance via Latent Key/confirmation #/EOC [id number] Status is pending

## 2024-11-09 ENCOUNTER — Other Ambulatory Visit (HOSPITAL_BASED_OUTPATIENT_CLINIC_OR_DEPARTMENT_OTHER): Payer: Self-pay

## 2024-11-10 ENCOUNTER — Encounter (INDEPENDENT_AMBULATORY_CARE_PROVIDER_SITE_OTHER): Payer: Self-pay | Admitting: Nurse Practitioner

## 2024-11-10 ENCOUNTER — Encounter: Payer: Self-pay | Admitting: Pulmonary Disease

## 2024-11-10 ENCOUNTER — Ambulatory Visit: Admitting: Pulmonary Disease

## 2024-11-10 VITALS — BP 136/72 | HR 98 | Ht 64.0 in | Wt 305.0 lb

## 2024-11-10 DIAGNOSIS — G4733 Obstructive sleep apnea (adult) (pediatric): Secondary | ICD-10-CM

## 2024-11-10 DIAGNOSIS — R0609 Other forms of dyspnea: Secondary | ICD-10-CM

## 2024-11-10 NOTE — Progress Notes (Signed)
 Pamela [name]    [id number]    09-05-1961  Primary Care Physician:Wendling, Mabel Mt, DO  Referring Physician: Neda Jennet LABOR, MD [address] [place],  [place] [address]  Chief complaint:   Presenting for follow-up for obstructive lung disease  Discussed the use of AI scribe software for clinical note transcription with the patient, who gave verbal consent to proceed.  History of Present Illness Pamela [name] is a 63 year old female with chronic lung disease and sleep apnea who presents for evaluation of her respiratory symptoms and sleep study results.  She has a history of chronic lung disease and sleep apnea. A recent sleep study confirmed sleep apnea, although she did not sleep well during the study. She is unsure of her current CPAP settings and lacks a consistent medical supply company due to high costs.  Earlier this month, she experienced severe respiratory distress, feeling 'terrified' and unable to walk to the restroom without significant shortness of breath. She left messages for her medical team but did not receive a response, leading her to use Breztri  as a rescue inhaler, which provided relief. She later consulted with Dr. Annella, who prescribed a new medication for her nebulizer, which she started using last Saturday. Since then, she feels less terrified and notes a 'light at the end of the tunnel.'  Her current medications include Breztri , used twice daily, and a new nebulizer medication, Ultravir, used twice daily. Previous rescue inhaler and nebulizer treatments were ineffective. She quit smoking on August 19th and has not smoked since.  She experiences significant shortness of breath with minimal exertion, unable to walk a football field without wheezing. However, she engages in swimming and water  resistance exercises without issues. She has a stressful job that she enjoys and is concerned about the impact of her condition on her  work life.  A recent CT scan was performed, and the patient recalls undergoing multiple tests as recommended by her clinician. She has been practicing breathing techniques taught by a sleep study technician, which have helped manage her flare-ups. She expressed concern about her prognosis and asked about the possibility of needing a lung transplant.  She was recently started on Ensifentrine  and this seems to be helping  Outpatient Encounter Medications as of 11/10/2024  Medication Sig   acetaminophen  (TYLENOL ) 500 MG tablet Take 1,000 mg by mouth daily as needed for moderate pain.   albuterol  (PROVENTIL ) (2.5 MG/3ML) 0.083% nebulizer solution Take 3 mLs (2.5 mg total) by nebulization every 6 (six) hours as needed for wheezing or shortness of breath.   albuterol  (VENTOLIN  HFA) 108 (90 Base) MCG/ACT inhaler Inhale 1-2 puffs into the lungs every 6 (six) hours as needed for wheezing or shortness of breath.   amitriptyline  (ELAVIL ) 10 MG tablet Take 1 tablet (10 mg total) by mouth at bedtime.   aspirin  EC 81 MG tablet Take 81 mg by mouth daily. Swallow whole.   atorvastatin  (LIPITOR) 20 MG tablet Take 1 tablet (20 mg total) by mouth daily.   budesonide -glycopyrrolate -formoterol  (BREZTRI  AEROSPHERE) 160-9-4.8 MCG/ACT AERO inhaler Inhale 2 puffs into the lungs 2 (two) times daily. Rinse mouth after use   busPIRone  (BUSPAR ) 10 MG tablet Take 20 mg by mouth 3 (three) times daily.   clobetasol  (TEMOVATE ) 0.05 % external solution Apply 1 Application topically 2 (two) times daily as needed (irritation).   dicyclomine  (BENTYL ) 10 MG capsule Take 1 capsule (10 mg total) by mouth every  6 (six) hours as needed for spasms.   Ensifentrine  (OHTUVAYRE ) 3 MG/2.5ML SUSP    fluticasone  (FLONASE ) 50 MCG/ACT nasal spray Place 2 sprays into both nostrils 2 (two) times daily.   hydrOXYzine  (VISTARIL ) 50 MG capsule Take 50 mg by mouth 4 (four) times daily as needed.   levocetirizine (XYZAL  ALLERGY 24HR) 5 MG tablet Take 1  tablet (5 mg total) by mouth every evening.   levothyroxine  (SYNTHROID ) 150 MCG tablet Take 1 tablet (150 mcg total) by mouth daily before breakfast.   Melatonin 10 MG CAPS Take 10 mg by mouth at bedtime.   modafinil  (PROVIGIL ) 200 MG tablet Take 1 tablet (200 mg total) by mouth in the morning.   Multiple Vitamin (MULTIVITAMIN WITH MINERALS) TABS tablet Take 1 tablet by mouth daily.   omeprazole  (PRILOSEC) 40 MG capsule TAKE 1 CAPSULE(40 MG) BY MOUTH DAILY AS NEEDED FOR HEARTBURN   sertraline (ZOLOFT) 100 MG tablet Take 100 mg by mouth daily.   chlorhexidine  (PERIDEX ) 0.12 % solution Use as directed 15 mLs in the mouth or throat 2 (two) times daily. (Patient not taking: Reported on 11/04/2024)   ketoconazole  (NIZORAL ) 2 % cream APPLY TO AREA ON ARM DAILY FOR 2 WEEKS (Patient not taking: Reported on 11/04/2024)   No facility-administered encounter medications on file as of 11/10/2024.    Allergies as of 11/10/2024   (No Known Allergies)    Past Medical History:  Diagnosis Date   Alcohol abuse    Anemia    Anxiety    Asthma    B12 deficiency    Back pain    Colon polyps    Constipation    COPD (chronic obstructive pulmonary disease) (HCC)    Depression    GERD (gastroesophageal reflux disease)    Heart murmur    childhood   Heartburn    History of alcohol abuse    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)   History of blood transfusion 2006   HLD (hyperlipidemia)    Hypothyroidism    Obesity    Pre-diabetes    Sleep apnea    cpap   Thyroid  disease     Past Surgical History:  Procedure Laterality Date   AUGMENTATION MAMMAPLASTY Bilateral 1987   Saline   BREAST BIOPSY     LAPAROSCOPIC GASTRIC BANDING  2010   VENTRAL HERNIA REPAIR N/A 07/16/2019   Procedure: VENTRAL INCISIONAL HERNIA REPAIR WITH MESH;  Surgeon: Vernetta Berg, MD;  Location: [facility];  Service: General;  Laterality: N/A;   XI ROBOTIC ASSISTED VENTRAL HERNIA N/A 03/19/2023   Procedure: OPEN PRIMARY CLOSURE OF  RECURRENT INCISIONAL HERNIA AND REMOVAL OF INTRAPERITONEAL MESH;  Surgeon: Lyndel Deward PARAS, MD;  Location: [facility];  Service: General;  Laterality: N/A;    Family History  Problem Relation Age of Onset   Alcohol abuse Mother    Depression Mother    Early death Mother    Liver disease Mother    Hyperlipidemia Father    Alcohol abuse Father    Obesity Father    Cancer Father     Social History   Socioeconomic History   Marital status: Single    Spouse name: Not on file   Number of children: Not on file   Years of education: Not on file   Highest education level: Associate degree: occupational, scientist, product/process development, or vocational program  Occupational History   Not on file  Tobacco Use   Smoking status: Former    Current packs/day: 0.25  Average packs/day: 0.3 packs/day for 30.0 years (7.5 ttl pk-yrs)    Types: Cigarettes   Smokeless tobacco: Never   Tobacco comments:    Quit for 3 years. Patient quit 07/14/24  Vaping Use   Vaping status: Some Days  Substance and Sexual Activity   Alcohol use: No    Comment: 13 years sober   Drug use: No   Sexual activity: Not Currently  Other Topics Concern   Not on file  Social History Narrative   Not on file   Social Drivers of Health   Tobacco Use: Medium Risk (11/10/2024)   Patient History    Smoking Tobacco Use: Former    Smokeless Tobacco Use: Never    Passive Exposure: Not on file  Financial Resource Strain: Medium Risk (03/23/2024)   Overall Financial Resource Strain (CARDIA)    Difficulty of Paying Living Expenses: Somewhat hard  Food Insecurity: Food Insecurity Present (03/23/2024)   Hunger Vital Sign    Worried About Running Out of Food in the Last Year: Often true    Ran Out of Food in the Last Year: Sometimes true  Transportation Needs: No Transportation Needs (03/23/2024)   PRAPARE - Administrator, Civil Service (Medical): No    Lack of Transportation (Non-Medical): No  Physical Activity: Insufficiently  Active (03/23/2024)   Exercise Vital Sign    Days of Exercise per Week: 3 days    Minutes of Exercise per Session: 30 min  Stress: Stress Concern Present (03/23/2024)   [employer] of Occupational Health - Occupational Stress Questionnaire    Feeling of Stress : Very much  Social Connections: Moderately Integrated (03/23/2024)   Social Connection and Isolation Panel    Frequency of Communication with Friends and Family: More than three times a week    Frequency of Social Gatherings with Friends and Family: Once a week    Attends Religious Services: 1 to 4 times per year    Active Member of [employer] or Organizations: Yes    Attends Banker Meetings: More than 4 times per year    Marital Status: Divorced  Catering Manager Violence: Not on file  Depression (PHQ2-9): High Risk (11/04/2024)   Depression (PHQ2-9)    PHQ-2 Score: 15  Alcohol Screen: Not on file  Housing: Low Risk (03/23/2024)   Housing Stability Vital Sign    Unable to Pay for Housing in the Last Year: No    Number of Times Moved in the Last Year: 0    Homeless in the Last Year: No  Utilities: Not on file  Health Literacy: Not on file    Review of Systems  Respiratory:  Positive for shortness of breath.   Psychiatric/Behavioral:  Positive for sleep disturbance.     Vitals:   11/10/24 0915  BP: 136/72  Pulse: 98  SpO2: 95%     Physical Exam Constitutional:      Appearance: She is obese.  HENT:     Head: Normocephalic.     Nose: Nose normal.     Mouth/Throat:     Mouth: Mucous membranes are moist.  Eyes:     General: No scleral icterus. Cardiovascular:     Rate and Rhythm: Normal rate and regular rhythm.     Heart sounds: No murmur heard.    No friction rub.  Pulmonary:     Effort: No respiratory distress.     Breath sounds: No stridor. No wheezing or rhonchi.  Musculoskeletal:     Cervical  back: No rigidity or tenderness.  Skin:    General: Skin is warm.  Neurological:     General:  No focal deficit present.  Psychiatric:        Mood and Affect: Mood normal.    Data Reviewed: Sleep study was limited by insomnia with only 34% efficiency - Patient states this was secondary to not being on a CPAP - Study shows mild obstructive sleep apnea with an AHI of 11.6  PFT 08/24/2024 - Severe obstruction with significant bronchodilator response evidence of air trapping with normal diffusing capacity Assessment and Plan Assessment & Plan Obstructive sleep apnea Confirmed by sleep study. Due to severe lung disease, BiPAP may be necessary to manage both sleep apnea and lung function. BiPAP titration required to determine appropriate pressure settings. - Ordered BiPAP titration sleep study to determine appropriate pressure settings.  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 Lung function significantly reduced with FEV1 at 33% and FVC at 52%. Symptoms include shortness of breath and wheezing. Current treatment includes Breztri  and Ohtuvayre , with albuterol  as needed. Pulmonary rehabilitation discussed but declined due to work constraints. Emphasized importance of avoiding smoking and secondhand smoke exposure. Discussed potential for lung transplant but noted current BMI and lung function do not meet criteria. Encouraged maintaining a positive outlook and focusing on current improvements. - Continue Breztri  two puffs twice daily. - Continue Ohtuvayre  two nebulizer treatments daily. - Use albuterol  as needed, up to four times daily. - Encouraged exercise and maintaining physical activity. - Advised to avoid smoking and secondhand smoke exposure. - Discussed potential for lung transplant but noted current BMI and lung function do not meet criteria.  Schedule echocardiogram to assess right-sided cardiac functions  Class III obesity - Encouraged continued weight loss efforts  Follow-up in  about 3 months  Call with significant concerns   Orders Placed This Encounter  Procedures   ECHOCARDIOGRAM COMPLETE    Standing Status:   Future    Expiration Date:   11/10/2025    [facility]    Does the patient weigh less than or greater than 250 lbs?:   Patient weighs greater than 250 lbs    Perflutren DEFINITY (image enhancing agent) should be administered unless hypersensitivity or allergy exist:   Administer Perflutren    Reason for exam-Echo:   Dyspnea  R06.00   Bipap titration    Standing Status:   Future    Expiration Date:   11/10/2025    Where should this test be performed::   [facility]      Jennet Epley MD [facility] 11/10/2024, 9:56 AM  CC: Epley Jennet LABOR, MD

## 2024-11-10 NOTE — Patient Instructions (Addendum)
 We will schedule you for a titration study -Your sleep study was limited by inability to sleep and as you said it was because you were not on a CPAP -We will need to titrate you to a BiPAP -We may need to add oxygen to it  Continue breathing treatments- -Breztri  twice a day - Ohtuvayre  twice a day - Albuterol  as needed up to 4 times a day  Regular exercises  I will schedule you for an echocardiogram -To check the heart to make sure that the heart is okay as well  Weight loss efforts  Follow-up in 3 months  Although you swim on a regular basis, we should consider pulmonary rehab as this may help as well

## 2024-11-11 NOTE — Addendum Note (Signed)
 Addended by: DELORES CASTOR A on: 11/11/2024 10:22 AM   Modules accepted: Orders

## 2024-11-17 ENCOUNTER — Ambulatory Visit (INDEPENDENT_AMBULATORY_CARE_PROVIDER_SITE_OTHER): Admitting: Nurse Practitioner

## 2024-11-21 ENCOUNTER — Other Ambulatory Visit: Payer: Self-pay | Admitting: Pulmonary Disease

## 2024-11-21 DIAGNOSIS — J4489 Other specified chronic obstructive pulmonary disease: Secondary | ICD-10-CM

## 2024-11-23 ENCOUNTER — Ambulatory Visit (HOSPITAL_BASED_OUTPATIENT_CLINIC_OR_DEPARTMENT_OTHER)
Admission: RE | Admit: 2024-11-23 | Discharge: 2024-11-23 | Disposition: A | Discharge status: Home / Self Care | Source: Ambulatory Visit | Attending: Pulmonary Disease | Admitting: Pulmonary Disease

## 2024-11-23 ENCOUNTER — Other Ambulatory Visit: Payer: Self-pay | Admitting: Pulmonary Disease

## 2024-11-23 DIAGNOSIS — G4733 Obstructive sleep apnea (adult) (pediatric): Secondary | ICD-10-CM

## 2024-11-23 DIAGNOSIS — R0609 Other forms of dyspnea: Secondary | ICD-10-CM | POA: Diagnosis not present

## 2024-11-23 NOTE — Telephone Encounter (Signed)
 Refill sent for OHTUVAYRE  to [facility]  Dose: 3mg  by nebulizer twice daily   Last OV: 11/10/24 Provider: Dr. Neda  Next OV: March 2026, not yet scheduled  Routing to scheduling team for follow-up on appt scheduling  Aleck Puls, PharmD, BCPS Clinical Pharmacist  [facility]

## 2024-11-24 LAB — ECHOCARDIOGRAM COMPLETE
AR max vel: 3.26 cm2
AV Area VTI: 2.83 cm2
AV Area mean vel: 2.93 cm2
AV Mean grad: 6 mmHg
AV Peak grad: 9.9 mmHg
Ao pk vel: 1.57 m/s
Area-P 1/2: 4.06 cm2
Calc EF: 65.4 %
S' Lateral: 2 cm
Single Plane A2C EF: 66 %
Single Plane A4C EF: 64.6 %

## 2024-11-29 ENCOUNTER — Other Ambulatory Visit: Payer: Self-pay | Admitting: Family Medicine

## 2024-11-29 ENCOUNTER — Other Ambulatory Visit (HOSPITAL_COMMUNITY): Payer: Self-pay

## 2024-11-29 ENCOUNTER — Other Ambulatory Visit (INDEPENDENT_AMBULATORY_CARE_PROVIDER_SITE_OTHER): Payer: Self-pay | Admitting: Otolaryngology

## 2024-11-29 DIAGNOSIS — J36 Peritonsillar abscess: Secondary | ICD-10-CM

## 2024-11-29 DIAGNOSIS — G8929 Other chronic pain: Secondary | ICD-10-CM

## 2024-11-29 DIAGNOSIS — J449 Chronic obstructive pulmonary disease, unspecified: Secondary | ICD-10-CM

## 2024-11-30 ENCOUNTER — Other Ambulatory Visit: Payer: Self-pay

## 2024-11-30 ENCOUNTER — Other Ambulatory Visit (HOSPITAL_COMMUNITY): Payer: Self-pay

## 2024-11-30 MED ORDER — BREZTRI AEROSPHERE 160-9-4.8 MCG/ACT IN AERO
2.0000 | INHALATION_SPRAY | Freq: Two times a day (BID) | RESPIRATORY_TRACT | 3 refills | Status: AC
  Filled 2024-11-30: qty 10.7, 30d supply, fill #0
  Filled 2025-01-01: qty 10.7, 30d supply, fill #1

## 2024-11-30 MED ORDER — AMITRIPTYLINE HCL 10 MG PO TABS
10.0000 mg | ORAL_TABLET | Freq: Every day | ORAL | 3 refills | Status: AC
  Filled 2024-11-30: qty 90, 90d supply, fill #0
  Filled 2025-01-01: qty 90, 90d supply, fill #1

## 2024-11-30 MED ORDER — CHLORHEXIDINE GLUCONATE 0.12 % MT SOLN
15.0000 mL | Freq: Two times a day (BID) | OROMUCOSAL | 0 refills | Status: AC
  Filled 2024-11-30: qty 473, 16d supply, fill #0

## 2024-12-01 ENCOUNTER — Other Ambulatory Visit: Payer: Self-pay

## 2024-12-07 ENCOUNTER — Other Ambulatory Visit (INDEPENDENT_AMBULATORY_CARE_PROVIDER_SITE_OTHER): Payer: Self-pay | Admitting: Family Medicine

## 2024-12-07 ENCOUNTER — Other Ambulatory Visit (HOSPITAL_COMMUNITY): Payer: Self-pay

## 2024-12-07 ENCOUNTER — Other Ambulatory Visit: Payer: Self-pay

## 2024-12-07 ENCOUNTER — Encounter (HOSPITAL_BASED_OUTPATIENT_CLINIC_OR_DEPARTMENT_OTHER): Payer: Self-pay | Admitting: Pulmonary Disease

## 2024-12-07 MED ORDER — LEVOCETIRIZINE DIHYDROCHLORIDE 5 MG PO TABS
5.0000 mg | ORAL_TABLET | Freq: Every evening | ORAL | 3 refills | Status: AC
  Filled 2024-12-07: qty 30, 30d supply, fill #0
  Filled 2025-01-01: qty 30, 30d supply, fill #1

## 2024-12-08 ENCOUNTER — Ambulatory Visit (INDEPENDENT_AMBULATORY_CARE_PROVIDER_SITE_OTHER): Admitting: Nurse Practitioner

## 2024-12-14 ENCOUNTER — Other Ambulatory Visit: Payer: Self-pay

## 2024-12-14 ENCOUNTER — Encounter (HOSPITAL_BASED_OUTPATIENT_CLINIC_OR_DEPARTMENT_OTHER): Payer: Self-pay

## 2024-12-14 ENCOUNTER — Ambulatory Visit: Payer: Self-pay | Admitting: Pulmonary Disease

## 2024-12-14 ENCOUNTER — Inpatient Hospital Stay (HOSPITAL_BASED_OUTPATIENT_CLINIC_OR_DEPARTMENT_OTHER)
Admission: EM | Admit: 2024-12-14 | Discharge: 2024-12-24 | DRG: 177 | Disposition: A | Discharge status: Home / Self Care | Attending: Internal Medicine | Admitting: Internal Medicine

## 2024-12-14 ENCOUNTER — Emergency Department (HOSPITAL_BASED_OUTPATIENT_CLINIC_OR_DEPARTMENT_OTHER)

## 2024-12-14 DIAGNOSIS — R109 Unspecified abdominal pain: Secondary | ICD-10-CM | POA: Diagnosis present

## 2024-12-14 DIAGNOSIS — E039 Hypothyroidism, unspecified: Secondary | ICD-10-CM | POA: Diagnosis present

## 2024-12-14 DIAGNOSIS — R7303 Prediabetes: Secondary | ICD-10-CM | POA: Diagnosis present

## 2024-12-14 DIAGNOSIS — J449 Chronic obstructive pulmonary disease, unspecified: Secondary | ICD-10-CM | POA: Diagnosis present

## 2024-12-14 DIAGNOSIS — J9601 Acute respiratory failure with hypoxia: Secondary | ICD-10-CM | POA: Diagnosis present

## 2024-12-14 DIAGNOSIS — Z7982 Long term (current) use of aspirin: Secondary | ICD-10-CM

## 2024-12-14 DIAGNOSIS — F419 Anxiety disorder, unspecified: Secondary | ICD-10-CM | POA: Diagnosis not present

## 2024-12-14 DIAGNOSIS — J36 Peritonsillar abscess: Secondary | ICD-10-CM

## 2024-12-14 DIAGNOSIS — E7849 Other hyperlipidemia: Secondary | ICD-10-CM | POA: Diagnosis present

## 2024-12-14 DIAGNOSIS — Z789 Other specified health status: Secondary | ICD-10-CM | POA: Diagnosis not present

## 2024-12-14 DIAGNOSIS — J441 Chronic obstructive pulmonary disease with (acute) exacerbation: Secondary | ICD-10-CM | POA: Diagnosis present

## 2024-12-14 DIAGNOSIS — F32A Depression, unspecified: Secondary | ICD-10-CM | POA: Diagnosis present

## 2024-12-14 DIAGNOSIS — K219 Gastro-esophageal reflux disease without esophagitis: Secondary | ICD-10-CM | POA: Diagnosis present

## 2024-12-14 DIAGNOSIS — Z6841 Body Mass Index (BMI) 40.0 and over, adult: Secondary | ICD-10-CM

## 2024-12-14 DIAGNOSIS — Z79899 Other long term (current) drug therapy: Secondary | ICD-10-CM

## 2024-12-14 DIAGNOSIS — U071 COVID-19: Secondary | ICD-10-CM | POA: Diagnosis present

## 2024-12-14 DIAGNOSIS — Z79890 Hormone replacement therapy (postmenopausal): Secondary | ICD-10-CM | POA: Diagnosis not present

## 2024-12-14 DIAGNOSIS — Z87891 Personal history of nicotine dependence: Secondary | ICD-10-CM

## 2024-12-14 DIAGNOSIS — G8929 Other chronic pain: Secondary | ICD-10-CM

## 2024-12-14 DIAGNOSIS — Z83438 Family history of other disorder of lipoprotein metabolism and other lipidemia: Secondary | ICD-10-CM

## 2024-12-14 DIAGNOSIS — J432 Centrilobular emphysema: Secondary | ICD-10-CM | POA: Diagnosis present

## 2024-12-14 DIAGNOSIS — E66813 Obesity, class 3: Secondary | ICD-10-CM | POA: Diagnosis present

## 2024-12-14 LAB — BASIC METABOLIC PANEL WITH GFR
Anion gap: 12 (ref 5–15)
BUN: 10 mg/dL (ref 8–23)
CO2: 27 mmol/L (ref 22–32)
Calcium: 8.9 mg/dL (ref 8.9–10.3)
Chloride: 100 mmol/L (ref 98–111)
Creatinine, Ser: 0.64 mg/dL (ref 0.44–1.00)
GFR, Estimated: >60 mL/min
Glucose, Bld: 118 mg/dL — ABNORMAL HIGH (ref 70–99)
Potassium: 3.6 mmol/L (ref 3.5–5.1)
Sodium: 140 mmol/L (ref 135–145)

## 2024-12-14 LAB — CBC
HCT: 43.5 % (ref 36.0–46.0)
Hemoglobin: 14.1 g/dL (ref 12.0–15.0)
MCH: 27.6 pg (ref 26.0–34.0)
MCHC: 32.4 g/dL (ref 30.0–36.0)
MCV: 85.1 fL (ref 80.0–100.0)
Platelets: 324 K/uL (ref 150–400)
RBC: 5.11 MIL/uL (ref 3.87–5.11)
RDW: 14.7 % (ref 11.5–15.5)
WBC: 6.4 K/uL (ref 4.0–10.5)
nRBC: 0 % (ref 0.0–0.2)

## 2024-12-14 LAB — RESP PANEL BY RT-PCR (RSV, FLU A&B, COVID)  RVPGX2
Influenza A by PCR: NEGATIVE
Influenza B by PCR: NEGATIVE
Resp Syncytial Virus by PCR: NEGATIVE
SARS Coronavirus 2 by RT PCR: POSITIVE — AB

## 2024-12-14 MED ORDER — CHLORHEXIDINE GLUCONATE 0.12 % MT SOLN: 15.0000 mL | Freq: Two times a day (BID) | OROMUCOSAL | Status: DC | PRN

## 2024-12-14 MED ORDER — IPRATROPIUM-ALBUTEROL 0.5-2.5 (3) MG/3ML IN SOLN
3.0000 mL | RESPIRATORY_TRACT | Status: DC | PRN
  Administered 2024-12-14: 3 mL via RESPIRATORY_TRACT
  Filled 2024-12-14: qty 3

## 2024-12-14 MED ORDER — ADULT MULTIVITAMIN W/MINERALS CH
1.0000 | ORAL_TABLET | Freq: Every day | ORAL | Status: DC
  Administered 2024-12-15 – 2024-12-24 (×10): 1 via ORAL
  Filled 2024-12-14 (×10): qty 1

## 2024-12-14 MED ORDER — PANTOPRAZOLE SODIUM 40 MG PO TBEC
80.0000 mg | DELAYED_RELEASE_TABLET | Freq: Every day | ORAL | Status: DC | PRN
  Administered 2024-12-15 – 2024-12-23 (×3): 80 mg via ORAL
  Filled 2024-12-14 (×4): qty 2

## 2024-12-14 MED ORDER — ONDANSETRON HCL 4 MG PO TABS: 4.0000 mg | ORAL_TABLET | Freq: Four times a day (QID) | ORAL | Status: DC | PRN

## 2024-12-14 MED ORDER — ACETAMINOPHEN 325 MG PO TABS
650.0000 mg | ORAL_TABLET | Freq: Four times a day (QID) | ORAL | Status: AC | PRN
  Filled 2024-12-14: qty 2

## 2024-12-14 MED ORDER — ALBUTEROL SULFATE (2.5 MG/3ML) 0.083% IN NEBU
INHALATION_SOLUTION | RESPIRATORY_TRACT | Status: AC
  Administered 2024-12-14: 10 mg
  Filled 2024-12-14: qty 12

## 2024-12-14 MED ORDER — IPRATROPIUM-ALBUTEROL 0.5-2.5 (3) MG/3ML IN SOLN
3.0000 mL | Freq: Four times a day (QID) | RESPIRATORY_TRACT | Status: AC
  Administered 2024-12-14 – 2024-12-15 (×3): 3 mL via RESPIRATORY_TRACT
  Filled 2024-12-14 (×3): qty 3

## 2024-12-14 MED ORDER — LEVOTHYROXINE SODIUM 75 MCG PO TABS
150.0000 ug | ORAL_TABLET | Freq: Every day | ORAL | Status: DC
  Administered 2024-12-15 – 2024-12-24 (×8): 150 ug via ORAL
  Filled 2024-12-14 (×10): qty 2

## 2024-12-14 MED ORDER — SENNOSIDES-DOCUSATE SODIUM 8.6-50 MG PO TABS: 1.0000 | ORAL_TABLET | Freq: Every evening | ORAL | Status: DC | PRN

## 2024-12-14 MED ORDER — LORAZEPAM 1 MG PO TABS
1.0000 mg | ORAL_TABLET | Freq: Once | ORAL | Status: AC
  Administered 2024-12-14: 1 mg via ORAL
  Filled 2024-12-14: qty 1

## 2024-12-14 MED ORDER — LORAZEPAM 2 MG/ML IJ SOLN: 0.5000 mg | INTRAMUSCULAR | Status: DC | PRN

## 2024-12-14 MED ORDER — PREDNISONE 50 MG PO TABS
60.0000 mg | ORAL_TABLET | Freq: Once | ORAL | Status: AC
  Administered 2024-12-14: 60 mg via ORAL
  Filled 2024-12-14: qty 1

## 2024-12-14 MED ORDER — ATORVASTATIN CALCIUM 20 MG PO TABS
20.0000 mg | ORAL_TABLET | Freq: Every day | ORAL | Status: DC
  Administered 2024-12-14 – 2024-12-23 (×10): 20 mg via ORAL
  Filled 2024-12-14 (×10): qty 1

## 2024-12-14 MED ORDER — IPRATROPIUM-ALBUTEROL 0.5-2.5 (3) MG/3ML IN SOLN
3.0000 mL | Freq: Four times a day (QID) | RESPIRATORY_TRACT | Status: DC | PRN
  Administered 2024-12-15: 3 mL via RESPIRATORY_TRACT
  Filled 2024-12-14: qty 3

## 2024-12-14 MED ORDER — BUDESON-GLYCOPYRROL-FORMOTEROL 160-9-4.8 MCG/ACT IN AERO
2.0000 | INHALATION_SPRAY | Freq: Two times a day (BID) | RESPIRATORY_TRACT | Status: DC
  Administered 2024-12-14 – 2024-12-24 (×19): 2 via RESPIRATORY_TRACT
  Filled 2024-12-14 (×2): qty 5.9

## 2024-12-14 MED ORDER — SERTRALINE HCL 50 MG PO TABS
150.0000 mg | ORAL_TABLET | Freq: Every day | ORAL | Status: DC
  Administered 2024-12-15 – 2024-12-24 (×10): 150 mg via ORAL
  Filled 2024-12-14 (×10): qty 1

## 2024-12-14 MED ORDER — ALBUTEROL (5 MG/ML) CONTINUOUS INHALATION SOLN: 10.0000 mg/h | INHALATION_SOLUTION | Freq: Once | RESPIRATORY_TRACT | Status: DC

## 2024-12-14 MED ORDER — DICYCLOMINE HCL 10 MG PO CAPS
10.0000 mg | ORAL_CAPSULE | Freq: Four times a day (QID) | ORAL | Status: DC | PRN
  Administered 2024-12-20: 10 mg via ORAL
  Filled 2024-12-14 (×3): qty 1

## 2024-12-14 MED ORDER — FLUTICASONE PROPIONATE 50 MCG/ACT NA SUSP: 2.0000 | Freq: Two times a day (BID) | NASAL | Status: DC | PRN

## 2024-12-14 MED ORDER — AMITRIPTYLINE HCL 10 MG PO TABS
10.0000 mg | ORAL_TABLET | Freq: Every day | ORAL | Status: DC
  Administered 2024-12-14 – 2024-12-23 (×10): 10 mg via ORAL
  Filled 2024-12-14 (×11): qty 1

## 2024-12-14 MED ORDER — ACETAMINOPHEN 650 MG RE SUPP: 650.0000 mg | Freq: Four times a day (QID) | RECTAL | Status: AC | PRN

## 2024-12-14 MED ORDER — METHYLPREDNISOLONE SODIUM SUCC 125 MG IJ SOLR
80.0000 mg | Freq: Every day | INTRAMUSCULAR | Status: AC
  Administered 2024-12-14 – 2024-12-15 (×2): 80 mg via INTRAVENOUS
  Filled 2024-12-14 (×2): qty 2

## 2024-12-14 MED ORDER — ENOXAPARIN SODIUM 40 MG/0.4ML IJ SOSY
40.0000 mg | PREFILLED_SYRINGE | INTRAMUSCULAR | Status: DC
  Administered 2024-12-14 – 2024-12-23 (×10): 40 mg via SUBCUTANEOUS
  Filled 2024-12-14 (×10): qty 0.4

## 2024-12-14 MED ORDER — ONDANSETRON HCL 4 MG/2ML IJ SOLN: 4.0000 mg | Freq: Four times a day (QID) | INTRAMUSCULAR | Status: DC | PRN

## 2024-12-14 MED ORDER — LORATADINE 10 MG PO TABS
10.0000 mg | ORAL_TABLET | Freq: Every evening | ORAL | Status: DC
  Administered 2024-12-14 – 2024-12-23 (×10): 10 mg via ORAL
  Filled 2024-12-14 (×10): qty 1

## 2024-12-14 MED ORDER — IPRATROPIUM BROMIDE 0.02 % IN SOLN
0.5000 mg | Freq: Once | RESPIRATORY_TRACT | Status: AC
  Administered 2024-12-14: 0.5 mg via RESPIRATORY_TRACT
  Filled 2024-12-14: qty 2.5

## 2024-12-14 MED ORDER — BUSPIRONE HCL 10 MG PO TABS
20.0000 mg | ORAL_TABLET | Freq: Three times a day (TID) | ORAL | Status: DC
  Administered 2024-12-14 – 2024-12-24 (×29): 20 mg via ORAL
  Filled 2024-12-14: qty 2
  Filled 2024-12-14 (×4): qty 4
  Filled 2024-12-14 (×3): qty 2
  Filled 2024-12-14 (×3): qty 4
  Filled 2024-12-14 (×2): qty 2
  Filled 2024-12-14 (×2): qty 4
  Filled 2024-12-14 (×3): qty 2
  Filled 2024-12-14: qty 4
  Filled 2024-12-14 (×6): qty 2
  Filled 2024-12-14: qty 4
  Filled 2024-12-14 (×2): qty 2
  Filled 2024-12-14: qty 4
  Filled 2024-12-14 (×3): qty 2
  Filled 2024-12-14 (×3): qty 4
  Filled 2024-12-14: qty 2
  Filled 2024-12-14: qty 4
  Filled 2024-12-14 (×2): qty 2
  Filled 2024-12-14 (×3): qty 4
  Filled 2024-12-14: qty 2
  Filled 2024-12-14 (×3): qty 4
  Filled 2024-12-14 (×2): qty 2
  Filled 2024-12-14: qty 4
  Filled 2024-12-14: qty 2
  Filled 2024-12-14 (×2): qty 4
  Filled 2024-12-14: qty 2
  Filled 2024-12-14: qty 4
  Filled 2024-12-14: qty 2
  Filled 2024-12-14 (×3): qty 4
  Filled 2024-12-14: qty 2

## 2024-12-14 MED ORDER — ASPIRIN 81 MG PO TBEC
81.0000 mg | DELAYED_RELEASE_TABLET | Freq: Every day | ORAL | Status: DC
  Administered 2024-12-14 – 2024-12-24 (×11): 81 mg via ORAL
  Filled 2024-12-14 (×11): qty 1

## 2024-12-14 MED ORDER — MELATONIN 5 MG PO TABS
10.0000 mg | ORAL_TABLET | Freq: Every day | ORAL | Status: DC
  Administered 2024-12-14 – 2024-12-23 (×10): 10 mg via ORAL
  Filled 2024-12-14 (×10): qty 2

## 2024-12-14 MED ORDER — LEVOCETIRIZINE DIHYDROCHLORIDE 5 MG PO TABS: 5.0000 mg | ORAL_TABLET | Freq: Every evening | ORAL | Status: DC

## 2024-12-14 NOTE — Telephone Encounter (Signed)
 Noted, looks like patient is heading to the ED according to the Triage note.  Nothing further needed.

## 2024-12-14 NOTE — ED Notes (Signed)
 Called [facility] for transport @17 :20.  Spoke with Debby

## 2024-12-14 NOTE — Assessment & Plan Note (Signed)
 Secondary to COVID-19 leading to COPD exacerbation Symptomatic support Solu-Medrol  80 mg IV twice daily, 2 doses ordered DuoNebs 4 times daily, 3 doses scheduled ordered DuoNebs every 6 hours as needed for wheezing and shortness of breath, 3 days ordered Home Breztri  inhaler twice daily resumed Continue oxygen supplementation to maintain SpO2 greater than 92% Continuous pulse oximetry

## 2024-12-14 NOTE — Assessment & Plan Note (Signed)
Home atorvastatin 20 mg nightly resumed

## 2024-12-14 NOTE — Progress Notes (Signed)
" °  Progress Note   Patient: Pamela [name] DOB: 22-Aug-1961 DOA: 12/14/2024     0 DOS: the patient was seen and examined on 12/14/2024   Brief hospital course: Pamela [name] is a 64 year old female with history of severe COPD, anxiety, depression, hyperlipidemia, hypothyroid, GERD.  12/14/2024: She presents to the ED for chief concerns of shortness of breath, cough and wheezing for about 48 hours.  Vitals at the time my evaluation showed t of 97.8, rr 18, hr 99, blood pressure 130/106, SpO2 of 92% on 2 L nasal cannula.  Serum sodium is 140, potassium 2.6, chloride 100, bicarb 27, BUN of 10, serum creatinine 0.64, eGFR greater than 60, nonfasting blood glucose 118, WBC 6.5, hemoglobin 14.1, platelets of 324.  Patient tested positive for COVID-19 by PCR.  ED treatment: Albuterol  nebulizer one-time treatment, DuoNebs one-time treatment, ipratropium nebulizer one-time treatment, prednisone  40 mg p.o. one-time dose, Ativan  1 mg IV one-time dose.  12/14/2024: Patient admitted to hospital service for chief concerns of COPD exacerbation.  Patient was assessed and H&P completed via telemedicine encounter.  Assessment and Plan: * Acute hypoxic respiratory failure (HCC) Secondary to COVID-19 leading to COPD exacerbation Symptomatic support Solu-Medrol  80 mg IV twice daily, 2 doses ordered DuoNebs 4 times daily, 3 doses scheduled ordered DuoNebs every 6 hours as needed for wheezing and shortness of breath, 3 days ordered Home Breztri  inhaler twice daily resumed Continue oxygen supplementation to maintain SpO2 greater than 92% Continuous pulse oximetry  COVID-19 virus infection Airborne precautions  Chronic obstructive pulmonary disease (HCC) Continue home Breztri  Patient reports she has a nebulizer machine and nebulizer solution at home  Anxiety Home sertraline  150 mg daily, buspirone  20 mg 3 times daily, amitriptyline  100 mg nightly were resumed Lorazepam  0.5 mg IV every 4  hours as needed for anxiety, 3 doses ordered  Other hyperlipidemia Home atorvastatin  20 mg nightly resumed  Morbid obesity (HCC) This complicates overall care and prognosis.   Hypothyroidism Home levothyroxine  150 mcg daily before breakfast resumed      {Tip this will not be part of the note when signed There is no height or weight on file to calculate BMI. , ,  (Optional):26781}  Subjective: ***  Physical Exam: Vitals:   12/14/24 1745 12/14/24 1800 12/14/24 1830 12/14/24 1930  BP: (!) 135/90 (!) 141/91 (!) 146/94 (!) 147/102  Pulse: (!) 102 100 (!) 103   Resp: (!) 24 (!) 27 (!) 23 20  Temp: 98.2 F (36.8 C)  98.4 F (36.9 C) 98.4 F (36.9 C)  TempSrc: Oral  Oral Oral  SpO2: 92% 91% 92% 93%   *** Data Reviewed: {Tip this will not be part of the note when signed- Document your independent interpretation of telemetry tracing, EKG, lab, Radiology test or any other diagnostic tests. Add any new diagnostic test ordered today. (Optional):26781} {Results:26384}  Family Communication: ***  Disposition: Status is: Inpatient {Inpatient:23812}  Planned Discharge Destination: {DISCHARGE DESTINATION_TRH:27031} {Tip this will not be part of the note when signed  DVT Prophylaxis  ., Place ted hose  Enoxaparin  (lovenox ) injection 40 mg  (Optional):26781}   Time spent: *** minutes  Author: Camila DELENA Ned, MD 12/14/2024 9:19 PM  For on call review [email or web address].  "

## 2024-12-14 NOTE — ED Notes (Signed)
 Dr. Sherre on ipad for remote admission assessment.

## 2024-12-14 NOTE — Assessment & Plan Note (Signed)
 Home levothyroxine  150 mcg daily before breakfast resumed

## 2024-12-14 NOTE — H&P (Addendum)
 " History and Physical - Telemedicine  Pamela [name] FMW:[id number] DOB: Feb 17, 1961 DOA: 12/14/2024  PCP: Frann Mabel Mt, DO Patient coming from: home  Referring provider: Dr. Armenta, EDP Telemedicine provider: Dr. Sherre Patient location: [facility] Referring diagnosis: COPD exacerbation Patient name and DOB verified: Patient was able to verify her first and last name: Pamela [name], date of birth: May 06, 1961.  Patient consented to Telemedicine Evaluation: yes RN virtual assistant: Suzen Land, RN Video encounter time and date: 12/14/24 and at approximately: 15:27  Chief Concern: shortness of breath worsening, wheezing  HPI: [name] is a 64 year old female with history of severe COPD, anxiety, depression, hyperlipidemia, hypothyroid, GERD.  12/14/2024: She presents to the ED for chief concerns of shortness of breath, cough and wheezing for about 48 hours.  Vitals at the time my evaluation showed t of 97.8, rr 18, hr 99, blood pressure 130/106, SpO2 of 92% on 2 L nasal cannula.  Serum sodium is 140, potassium 2.6, chloride 100, bicarb 27, BUN of 10, serum creatinine 0.64, eGFR greater than 60, nonfasting blood glucose 118, WBC 6.5, hemoglobin 14.1, platelets of 324.  Patient tested positive for COVID-19 by PCR.  ED treatment: Albuterol  nebulizer one-time treatment, DuoNebs one-time treatment, ipratropium nebulizer one-time treatment, prednisone  40 mg p.o. one-time dose, Ativan  1 mg IV one-time dose.  12/14/2024: Patient admitted to hospital service for chief concerns of COPD exacerbation.  Patient was assessed and H&P completed via telemedicine encounter. ---------------------------- At bedside, patient was able to tell me her first last name, date of birth.  Patient reports she has been more short of breath with wheezing especially with ambulation for the last 48 hours.  She reports at baseline she is always short of breath due to severe COPD.  She reports that  at baseline she does not wear oxygen nor has this been prescribed to her in the past.  She endorses likely sick contacts that she is around a lot of people at work.  She reports mucus production that is white/clear.  She denies fever, chills, nausea, vomiting, diarrhea, dysuria, blood in her urine, blood in her stool, abdominal pain.  She reports she had some chest tightness that improved with the breathing treatments.  She reports she did use breathing treatments at home with her nebulizer with minimal improvements.  Social history: She lives at home alone with a dog.  She denies tobacco, EtOH, recreational drug use.  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  ROS: Constitutional: no weight change, no fever ENT/Mouth: no sore throat, no rhinorrhea Eyes: no eye pain, no vision changes Cardiovascular: no chest pain, no dyspnea,  no edema, no palpitations Respiratory: no cough, no sputum, no wheezing Gastrointestinal: no nausea, no vomiting, no diarrhea, no constipation Genitourinary: no urinary incontinence, no dysuria, no hematuria Musculoskeletal: no arthralgias, no myalgias Skin: no skin lesions, no pruritus, Neuro: + weakness, no loss of consciousness, no syncope Psych: no anxiety, no depression, + decrease appetite Heme/Lymph: no bruising, no bleeding  ED Course: Discussed with EDP, patient requiring hospitalization for chief concerns of COPD exacerbation.  Assessment/Plan  Principal Problem:   Acute hypoxic respiratory failure (HCC) Active Problems:   Chronic obstructive pulmonary disease (HCC)   COVID-19 virus infection   Anxiety   Hypothyroidism   Morbid obesity (HCC)   Other hyperlipidemia   Assessment and Plan:  * Acute hypoxic respiratory failure (HCC) Secondary to COVID-19 leading to COPD exacerbation Symptomatic support Solu-Medrol  80 mg IV twice daily, 2 doses  ordered DuoNebs 4 times daily, 3 doses scheduled ordered DuoNebs every 6 hours as  needed for wheezing and shortness of breath, 3 days ordered Home Breztri  inhaler twice daily resumed Continue oxygen supplementation to maintain SpO2 greater than 92% Continuous pulse oximetry  COVID-19 virus infection Airborne precautions  Chronic obstructive pulmonary disease (HCC) Continue home Breztri  Patient reports she has a nebulizer machine and nebulizer solution at home  Anxiety Home sertraline  150 mg daily, buspirone  20 mg 3 times daily, amitriptyline  100 mg nightly were resumed Lorazepam  0.5 mg IV every 4 hours as needed for anxiety, 3 doses ordered  Other hyperlipidemia Home atorvastatin  20 mg nightly resumed  Morbid obesity (HCC) This complicates overall care and prognosis.   Hypothyroidism Home levothyroxine  150 mcg daily before breakfast resumed  Chart reviewed.   DVT prophylaxis: Enoxaparin  Code Status: Full code Diet: Heart healthy Family Communication: A phone call was offered, patient declined Disposition Plan: Pending clinical course Consults called: None at this time Admission status: Telemetry  Past Medical History:  Diagnosis Date   Alcohol abuse    Anemia    Anxiety    Asthma    B12 deficiency    Back pain    Colon polyps    Constipation    COPD (chronic obstructive pulmonary disease) (HCC)    Depression    GERD (gastroesophageal reflux disease)    Heart murmur    childhood   Heartburn    History of alcohol abuse    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)   History of blood transfusion 2006   HLD (hyperlipidemia)    Hypothyroidism    Obesity    Pre-diabetes    Sleep apnea    cpap   Thyroid  disease    Past Surgical History:  Procedure Laterality Date   AUGMENTATION MAMMAPLASTY Bilateral 1987   [place]   BREAST BIOPSY     LAPAROSCOPIC GASTRIC BANDING  2010   VENTRAL HERNIA REPAIR N/A 07/16/2019   Procedure: VENTRAL INCISIONAL HERNIA REPAIR WITH MESH;  Surgeon: Vernetta Berg, MD;  Location: [facility] OR;  Service: General;  Laterality:  N/A;   XI ROBOTIC ASSISTED VENTRAL HERNIA N/A 03/19/2023   Procedure: OPEN PRIMARY CLOSURE OF RECURRENT INCISIONAL HERNIA AND REMOVAL OF INTRAPERITONEAL MESH;  Surgeon: Lyndel Deward PARAS, MD;  Location: [facility] ORS;  Service: General;  Laterality: N/A;    Social History:  reports that she has quit smoking. Her smoking use included cigarettes. She has a 7.5 pack-year smoking history. She has never used smokeless tobacco. She reports that she does not drink alcohol and does not use drugs.  Allergies[1] Family History  Problem Relation Age of Onset   Alcohol abuse Mother    Depression Mother    Early death Mother    Liver disease Mother    Hyperlipidemia Father    Alcohol abuse Father    Obesity Father    Cancer Father    Family history: Family history reviewed and not pertinent.  Prior to Admission medications  Medication Sig Start Date End Date Taking? Authorizing Provider  albuterol  (VENTOLIN  HFA) 108 (90 Base) MCG/ACT inhaler Inhale 1-2 puffs into the lungs every 6 (six) hours as needed for wheezing or shortness of breath. 08/03/24  Yes Frann Mabel Deward, DO  amitriptyline  (ELAVIL ) 10 MG tablet Take 1 tablet (10 mg total) by mouth at bedtime. 11/30/24  Yes Frann Mabel Deward, DO  aspirin  EC 81 MG tablet Take 81 mg by mouth daily. Swallow whole.   Yes [provider]  atorvastatin  (LIPITOR) 20 MG tablet Take 1 tablet (20 mg total) by mouth daily. 10/07/24  Yes Frann Mabel Mt, DO  budesonide -glycopyrrolate -formoterol  (BREZTRI  AEROSPHERE) 160-9-4.8 MCG/ACT AERO inhaler Inhale 2 puffs into the lungs 2 (two) times daily. Rinse mouth after use 11/30/24  Yes Wendling, Mabel Mt, DO  busPIRone  (BUSPAR ) 10 MG tablet Take 20 mg by mouth 3 (three) times daily.   Yes [provider]  chlorhexidine  (PERIDEX ) 0.12 % solution Use as directed 15 mLs in the mouth or throat 2 (two) times daily. 11/30/24  Yes Frann Mabel Mt, DO  clobetasol  (TEMOVATE ) 0.05 %  external solution Apply 1 Application topically 2 (two) times daily as needed (irritation). 10/28/24  Yes Frann Mabel Mt, DO  dicyclomine  (BENTYL ) 10 MG capsule Take 1 capsule (10 mg total) by mouth every 6 (six) hours as needed for spasms. 01/16/24  Yes Frann Mabel Mt, DO  fluticasone  (FLONASE ) 50 MCG/ACT nasal spray Place 2 sprays into both nostrils 2 (two) times daily. 03/26/24  Yes Soldatova, Liuba, MD  hydrOXYzine  (VISTARIL ) 50 MG capsule Take 50 mg by mouth 4 (four) times daily as needed. 05/19/24  Yes [provider]  ibuprofen (ADVIL) 200 MG tablet Take 200 mg by mouth every 6 (six) hours as needed.   Yes [provider]  levocetirizine (XYZAL  ALLERGY 24HR) 5 MG tablet Take 1 tablet (5 mg total) by mouth every evening. 12/07/24  Yes Frann Mabel Mt, DO  levothyroxine  (SYNTHROID ) 150 MCG tablet Take 1 tablet (150 mcg total) by mouth daily before breakfast. 09/02/24  Yes Wendling, Mabel Mt, DO  Melatonin 10 MG CAPS Take 10 mg by mouth at bedtime.   Yes [provider]  modafinil  (PROVIGIL ) 200 MG tablet Take 1 tablet (200 mg total) by mouth in the morning. 10/28/24  Yes Frann Mabel Mt, DO  OHTUVAYRE  3 MG/2.5ML SUSP Inhale one vial by nebulizer twice daily. Shake well before use. Do not mix with other nebulized medications. 11/23/24  Yes Olalere, Adewale A, MD  omeprazole  (PRILOSEC) 40 MG capsule TAKE 1 CAPSULE(40 MG) BY MOUTH DAILY AS NEEDED FOR HEARTBURN 11/30/24  Yes Wendling, Mabel Mt, DO  sertraline  (ZOLOFT ) 100 MG tablet Take 100 mg by mouth daily.   Yes [provider]  ketoconazole  (NIZORAL ) 2 % cream APPLY TO AREA ON ARM DAILY FOR 2 WEEKS Patient not taking: No sig reported 05/19/24   Frann Mabel Mt, DO   Physical Exam completed with assistance of: Suzen Land, RN, who was at bedside during this portion of the virtual encounter:  Vitals:   12/14/24 1505 12/14/24 1645 12/14/24 1745 12/14/24 1800  BP: (!)  130/106 (!) 138/102 (!) 135/90 (!) 141/91  Pulse: 98 (!) 113 (!) 102 100  Resp: 19 18 (!) 24 (!) 27  Temp: 97.8 F (36.6 C) 98 F (36.7 C) 98.2 F (36.8 C)   TempSrc: Oral Oral Oral   SpO2: 93% 92% 92% 91%   Constitutional: appears age-appropriate, weak, anxious Eyes: EOMI,  conjunctivae normal ENMT: Mucous membranes are moist. Hearing appropriate Neck: normal, supple, no masses, no thyromegaly Respiratory: Diminished lung sounds bilaterally, + expiratory wheezing diffusely. Increased respiratory effort.  Increased accessory muscle use.  Cardiovascular: Regular rate and rhythm, no murmurs. No extremity edema. 2+ pedal pulses. Abdomen: Obese abdomen, no tenderness. Bowel sounds positive.  Musculoskeletal: No joint deformity upper. Good ROM, no contractures, no atrophy. Skin: no rashes, ulcers on visible skin Neurologic: Strength is appropriate upper extremities.  Psychiatric: Normal judgment and insight. Alert  and oriented x 3. Anxious mood.   EKG: independently reviewed, showing sinus rhythm with rate of 99, QTc 460  Chest x-ray on Admission: I personally reviewed and I agree with radiologist reading as below.  DG Chest Portable 1 View Result Date: 12/14/2024 EXAM: 1 VIEW(S) XRAY OF THE CHEST 12/14/2024 12:07:38 PM COMPARISON: Conversion CT 08/17/2024. CLINICAL HISTORY: sob FINDINGS: LUNGS AND PLEURA: No focal pulmonary opacity. No pleural effusion. No pneumothorax. HEART AND MEDIASTINUM: No acute abnormality of the cardiac and mediastinal silhouettes. BONES AND SOFT TISSUES: No acute osseous abnormality. IMPRESSION: 1. No acute process. Electronically signed by: Katheleen Faes MD 12/14/2024 12:30 PM EST RP Workstation: [id number]   Labs on Admission: I have personally reviewed following labs  CBC: Recent Labs  Lab 12/14/24 1219  WBC 6.4  HGB 14.1  HCT 43.5  MCV 85.1  PLT 324   Basic Metabolic Panel: Recent Labs  Lab 12/14/24 1219  NA 140  K 3.6  CL 100  CO2 27   GLUCOSE 118*  BUN 10  CREATININE 0.64  CALCIUM  8.9   GFR: CrCl cannot be calculated (Unknown ideal weight.).  This document was prepared using Dragon Voice Recognition software and may include unintentional dictation errors.  Dr. Sherre [facility] My Location: [place]  If 7PM-7AM, please contact overnight-coverage provider If 7AM-7PM, please contact day attending provider [email or web address]  12/14/2024, 6:14 PM      [1] No Known Allergies  "

## 2024-12-14 NOTE — ED Notes (Signed)
Report received from Haley, RN. Assuming patient care at this time.

## 2024-12-14 NOTE — ED Triage Notes (Addendum)
 Reports SHOB, cough, congestion for 3 days.  Increased WOB while talking   Hx of COPD

## 2024-12-14 NOTE — ED Provider Notes (Signed)
 " [facility] Provider Note   CSN: [id number] Arrival date & time: 12/14/24  1121     Patient presents with: Shortness of Breath   Pamela [name] is a 64 y.o. female.   The history is provided by the patient.  Patient history of COPD presents with shortness of breath.  She reports over the past several days she has had increasing cough congestion and shortness of breath.  Her chest feels tight but no chest pain No fevers or vomiting No hemoptysis.  She is a previous smoker She called her pulmonologist who told her to go to the ER No lower extremity edema She reports she is not on home oxygen   Past Medical History:  Diagnosis Date   Alcohol abuse    Anemia    Anxiety    Asthma    B12 deficiency    Back pain    Colon polyps    Constipation    COPD (chronic obstructive pulmonary disease) (HCC)    Depression    GERD (gastroesophageal reflux disease)    Heart murmur    childhood   Heartburn    History of alcohol abuse    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)   History of blood transfusion 2006   HLD (hyperlipidemia)    Hypothyroidism    Obesity    Pre-diabetes    Sleep apnea    cpap   Thyroid  disease     Prior to Admission medications  Medication Sig Start Date End Date Taking? Authorizing Provider  acetaminophen  (TYLENOL ) 500 MG tablet Take 1,000 mg by mouth daily as needed for moderate pain. 04/05/23   [provider]  albuterol  (VENTOLIN  HFA) 108 (90 Base) MCG/ACT inhaler Inhale 1-2 puffs into the lungs every 6 (six) hours as needed for wheezing or shortness of breath. 08/03/24   Frann Mabel Mt, DO  amitriptyline  (ELAVIL ) 10 MG tablet Take 1 tablet (10 mg total) by mouth at bedtime. 11/30/24   Frann Mabel Mt, DO  aspirin  EC 81 MG tablet Take 81 mg by mouth daily. Swallow whole.    [provider]  atorvastatin  (LIPITOR) 20 MG tablet Take 1 tablet (20 mg total) by mouth daily. 10/07/24    Frann Mabel Mt, DO  budesonide -glycopyrrolate -formoterol  (BREZTRI  AEROSPHERE) 160-9-4.8 MCG/ACT AERO inhaler Inhale 2 puffs into the lungs 2 (two) times daily. Rinse mouth after use 11/30/24   Wendling, Mabel Mt, DO  busPIRone  (BUSPAR ) 10 MG tablet Take 20 mg by mouth 3 (three) times daily.    [provider]  chlorhexidine  (PERIDEX ) 0.12 % solution Use as directed 15 mLs in the mouth or throat 2 (two) times daily. 11/30/24   Frann Mabel Mt, DO  clobetasol  (TEMOVATE ) 0.05 % external solution Apply 1 Application topically 2 (two) times daily as needed (irritation). 10/28/24   Frann Mabel Mt, DO  dicyclomine  (BENTYL ) 10 MG capsule Take 1 capsule (10 mg total) by mouth every 6 (six) hours as needed for spasms. 01/16/24   Frann Mabel Mt, DO  fluticasone  (FLONASE ) 50 MCG/ACT nasal spray Place 2 sprays into both nostrils 2 (two) times daily. 03/26/24   Soldatova, Liuba, MD  hydrOXYzine  (VISTARIL ) 50 MG capsule Take 50 mg by mouth 4 (four) times daily as needed. 05/19/24   [provider]  ketoconazole  (NIZORAL ) 2 % cream APPLY TO AREA ON ARM DAILY FOR 2 WEEKS Patient not taking: Reported on 11/04/2024 05/19/24   Frann Mabel Mt, DO  levocetirizine (  XYZAL  ALLERGY 24HR) 5 MG tablet Take 1 tablet (5 mg total) by mouth every evening. 12/07/24   Frann Mabel Mt, DO  levothyroxine  (SYNTHROID ) 150 MCG tablet Take 1 tablet (150 mcg total) by mouth daily before breakfast. 09/02/24   Wendling, Mabel Mt, DO  Melatonin 10 MG CAPS Take 10 mg by mouth at bedtime.    [provider]  modafinil  (PROVIGIL ) 200 MG tablet Take 1 tablet (200 mg total) by mouth in the morning. 10/28/24   Frann Mabel Mt, DO  Multiple Vitamin (MULTIVITAMIN WITH MINERALS) TABS tablet Take 1 tablet by mouth daily.    [provider]  OHTUVAYRE  3 MG/2.5ML SUSP Inhale one vial by nebulizer twice daily. Shake well before use. Do not mix with other nebulized  medications. 11/23/24   Neda Jennet LABOR, MD  omeprazole  (PRILOSEC) 40 MG capsule TAKE 1 CAPSULE(40 MG) BY MOUTH DAILY AS NEEDED FOR HEARTBURN 11/30/24   Wendling, Mabel Mt, DO  sertraline  (ZOLOFT ) 100 MG tablet Take 100 mg by mouth daily.    [provider]    Allergies: Patient has no known allergies.    Review of Systems  Constitutional:  Negative for fever.  Respiratory:  Positive for cough, shortness of breath and wheezing.     Updated Vital Signs BP 115/73   Pulse 99   Temp 97.8 F (36.6 C) (Oral)   Resp (!) 23   SpO2 91%   Physical Exam CONSTITUTIONAL: Elderly and anxious HEAD: Normocephalic/atraumatic EYES: EOMI/PERRL ENMT: Mucous membranes moist, no stridor or drooling NECK: supple no meningeal signs SPINE/BACK:entire spine nontender CV: S1/S2 noted, no murmurs/rubs/gallops noted LUNGS: Wheezing bilaterally, no crackles, on nasal cannula, no distress ABDOMEN: soft, nontender NEURO: Pt is awake/alert/appropriate, moves all extremitiesx4.  No facial droop.   EXTREMITIES: pulses normal/equal, full ROM SKIN: warm, color normal PSYCH: no abnormalities of mood noted, alert and oriented to situation  (all labs ordered are listed, but only abnormal results are displayed) Labs Reviewed  RESP PANEL BY RT-PCR (RSV, FLU A&B, COVID)  RVPGX2 - Abnormal; Notable for the following components:      Result Value   SARS Coronavirus 2 by RT PCR POSITIVE (*)    All other components within normal limits  BASIC METABOLIC PANEL WITH GFR - Abnormal; Notable for the following components:   Glucose, Bld 118 (*)    All other components within normal limits  CBC    EKG: EKG Interpretation Date/Time:  Monday December 14 2024 11:39:07 EST Ventricular Rate:  99 PR Interval:  155 QRS Duration:  100 QT Interval:  358 QTC Calculation: 460 R Axis:   104  Text Interpretation: Sinus rhythm Right axis deviation Interpretation limited secondary to artifact Confirmed by  Midge Golas ([id number]) on 12/14/2024 11:42:14 AM  Radiology: [name] Chest Portable 1 View Result Date: 12/14/2024 EXAM: 1 VIEW(S) XRAY OF THE CHEST 12/14/2024 12:07:38 PM COMPARISON: Conversion CT 08/17/2024. CLINICAL HISTORY: sob FINDINGS: LUNGS AND PLEURA: No focal pulmonary opacity. No pleural effusion. No pneumothorax. HEART AND MEDIASTINUM: No acute abnormality of the cardiac and mediastinal silhouettes. BONES AND SOFT TISSUES: No acute osseous abnormality. IMPRESSION: 1. No acute process. Electronically signed by: Dayne Hassell MD 12/14/2024 12:30 PM EST RP Workstation: [id number]     .Critical Care  Performed by: Midge Golas, MD Authorized by: Midge Golas, MD   Critical care provider statement:    Critical care time (minutes):  60   Critical care start time:  12/14/2024 1:15 PM   Critical care end time:  12/14/2024 2:15 PM   Critical care time was exclusive of:  Separately billable procedures and treating other patients   Critical care was necessary to treat or prevent imminent or life-threatening deterioration of the following conditions:  Respiratory failure   Critical care was time spent personally by me on the following activities:  Obtaining history from patient or surrogate, examination of patient, evaluation of patient's response to treatment, development of treatment plan with patient or surrogate, ordering and review of laboratory studies, ordering and performing treatments and interventions, ordering and review of radiographic studies, pulse oximetry, re-evaluation of patient's condition and review of old charts   I assumed direction of critical care for this patient from another provider in my specialty: no      Medications Ordered in the ED  ipratropium-albuterol  (DUONEB) 0.5-2.5 (3) MG/3ML nebulizer solution 3 mL (3 mLs Nebulization Given 12/14/24 1207)  albuterol  (PROVENTIL ,VENTOLIN ) solution continuous neb (10 mg/hr Nebulization Not Given 12/14/24 1254)  predniSONE   (DELTASONE ) tablet 60 mg (60 mg Oral Given 12/14/24 1220)  ipratropium (ATROVENT ) nebulizer solution 0.5 mg (0.5 mg Nebulization Given 12/14/24 1207)  albuterol  (PROVENTIL ) (2.5 MG/3ML) 0.083% nebulizer solution (10 mg  Given 12/14/24 1254)  LORazepam  (ATIVAN ) tablet 1 mg (1 mg Oral Given 12/14/24 1413)    Clinical Course as of 12/14/24 1505  Mon Dec 14, 2024  1244 X-rays negative, patient still with wheezing, will give continuous albuterol  [DW]  1415 After multiple treatments, patient is still wheezing bilaterally and requiring oxygen She would need to be admitted.  Also incidentally found to have COVID-19 Patient reports she is going to have a panic attack [DW]  1504 Signed out to dr armenta at shift change to call report [DW]    Clinical Course User Index [DW] Midge Golas, MD                                 Medical Decision Making Amount and/or Complexity of Data Reviewed Labs: ordered. Radiology: ordered.  Risk Prescription drug management. Decision regarding hospitalization.   This patient presents to the ED for concern of shortness of breath, this involves an extensive number of treatment options, and is a complaint that carries with it a high risk of complications and morbidity.  The differential diagnosis includes but is not limited to Acute coronary syndrome, pneumonia, acute pulmonary edema, pneumothorax, acute anemia, pulmonary embolism Influenza  Comorbidities that complicate the patient evaluation: Patients presentation is complicated by their history of COPD  Social Determinants of Health: Patients previous tobacco use  increases the complexity of managing their presentation  Additional history obtained: Records reviewed pulmonology notes reviewed  Lab Tests: I Ordered, and personally interpreted labs.  The pertinent results include: Positive for COVID-19  Imaging Studies ordered: I ordered imaging studies including X-ray chest  I independently  visualized and interpreted imaging which showed no acute findings I agree with the radiologist interpretation  Medicines ordered and prescription drug management: I ordered medication including nebulizer therapy for shortness of breath Reevaluation of the patient after these medicines showed that the patient    stayed the same  Critical Interventions:   multiple nebulizer treatments, oxygen therapy   Reevaluation: After the interventions noted above, I reevaluated the patient and found that they have :stayed the same  Complexity of problems addressed: Patients presentation is most consistent with  acute presentation with potential threat to life or bodily function  Disposition: After consideration of the diagnostic  results and the patients response to treatment,  I feel that the patent would benefit from admission  .        Final diagnoses:  Chronic obstructive pulmonary disease with acute exacerbation (HCC)  COVID-19  Acute respiratory failure with hypoxia [facility])    ED Discharge Orders     None          Midge Golas, MD 12/14/24 1505  "

## 2024-12-14 NOTE — Assessment & Plan Note (Signed)
 -  This complicates overall care and prognosis.

## 2024-12-14 NOTE — ED Notes (Addendum)
 Patient departed [facility] ED with [facility] at this time.

## 2024-12-14 NOTE — Telephone Encounter (Signed)
 FYI Only or Action Required?: FYI only for provider: ED advised.  Patient is followed in Pulmonology for COPD, last seen on 11/10/2024 by Neda Jennet LABOR, MD.  Called Nurse Triage reporting Shortness of Breath and Cough.  Symptoms began several days ago.  Interventions attempted: Prescription medications: nebs and inhalers, Rescue inhaler, Maintenance inhaler, and Nebulizer treatments.  Symptoms are: unchanged.  Triage Disposition: Go to ED Now (Notify PCP)  Patient/caregiver understands and will follow disposition?:

## 2024-12-14 NOTE — Hospital Course (Signed)
 [name] is a 64 year old female with history of severe COPD, anxiety, depression, hyperlipidemia, hypothyroid, GERD.  12/14/2024: She presents to the ED for chief concerns of shortness of breath, cough and wheezing for about 48 hours.  Vitals at the time my evaluation showed t of 97.8, rr 18, hr 99, blood pressure 130/106, SpO2 of 92% on 2 L nasal cannula.  Serum sodium is 140, potassium 2.6, chloride 100, bicarb 27, BUN of 10, serum creatinine 0.64, eGFR greater than 60, nonfasting blood glucose 118, WBC 6.5, hemoglobin 14.1, platelets of 324.  Patient tested positive for COVID-19 by PCR.  ED treatment: Albuterol  nebulizer one-time treatment, DuoNebs one-time treatment, ipratropium nebulizer one-time treatment, prednisone  40 mg p.o. one-time dose, Ativan  1 mg IV one-time dose.  12/14/2024: Patient admitted to hospital service for chief concerns of COPD exacerbation.  Patient was assessed and H&P completed via telemedicine encounter.

## 2024-12-14 NOTE — ED Notes (Signed)
 SpO2 86% on room air placed back on 2 lpm [place].

## 2024-12-14 NOTE — Telephone Encounter (Signed)
" ° ° °  Reason for Triage: Pt reports she has severe COPD, with increased and worsening wheezing, difficulty breathing, and increased productive thick white mucuos, with chills.   Pt reports feels like she has a chest infection.   Pt is requesting an appt for evaluation.  Reason for Disposition  Wheezing can be heard across the room  Answer Assessment - Initial Assessment Questions Pt has history of severe COPD, she believes she has a chest infextion. She states the medicine she uses is not stopping the flare ups and there is more mucus involved now. She states last night she had to keep waking up in he middle of the night and getting up because she couldn't breath and was wheezing. She states she did have some chest pain this morning but taking a warm shower relieved that. She states she is scared at this point. She is taking the albuterol  inhaler more than every 6 hours and the nebulizer. Rn advised pt to go to the Er to be evaluated due to shortness of breath and using medications more than prescribed and they still aren't helping. Pt stated understanding. Pt decline RN assistance with 911. RN advised if chest pain returns to call them, but she was going to head in that direction.  Due to ER dispo, not all questions answered   1. RESPIRATORY STATUS: Describe your breathing? (e.g., wheezing, shortness of breath, unable to speak, severe coughing)      Shortness of breath, severe coughing 2. ONSET: When did this breathing problem begin?      The last couple of days.  3. PATTERN Does the difficult breathing come and go, or has it been constant since it started?       4. SEVERITY: How bad is your breathing? (e.g., mild, moderate, severe)      Moderate to severe at times 5. RECURRENT SYMPTOM: Have you had difficulty breathing before? If Yes, ask: When was the last time? and What happened that time?       6. CARDIAC HISTORY: Do you have any history of heart disease? (e.g., heart  attack, angina, bypass surgery, angioplasty)       7. LUNG HISTORY: Do you have any history of lung disease?  (e.g., pulmonary embolus, asthma, emphysema)     copd 8. CAUSE: What do you think is causing the breathing problem?       9. OTHER SYMPTOMS: Do you have any other symptoms? (e.g., chest pain, cough, dizziness, fever, runny nose)      10. O2 SATURATION MONITOR:  Do you use an oxygen saturation monitor (pulse oximeter) at home? If Yes, ask: What is your reading (oxygen level) today? What is your usual oxygen saturation reading? (e.g., 95%)        11. PREGNANCY: Is there any chance you are pregnant? When was your last menstrual period?        12. TRAVEL: Have you traveled out of the country in the last month? (e.g., travel history, exposures)  Protocols used: Breathing Difficulty-A-AH  "

## 2024-12-14 NOTE — Assessment & Plan Note (Signed)
 Home sertraline  150 mg daily, buspirone  20 mg 3 times daily, amitriptyline  100 mg nightly were resumed Lorazepam  0.5 mg IV every 4 hours as needed for anxiety, 3 doses ordered

## 2024-12-14 NOTE — Assessment & Plan Note (Signed)
Air borne precautions.

## 2024-12-14 NOTE — Assessment & Plan Note (Addendum)
 Continue home Breztri  Patient reports she has a nebulizer machine and nebulizer solution at home

## 2024-12-15 DIAGNOSIS — E039 Hypothyroidism, unspecified: Secondary | ICD-10-CM | POA: Diagnosis not present

## 2024-12-15 DIAGNOSIS — J9601 Acute respiratory failure with hypoxia: Secondary | ICD-10-CM | POA: Diagnosis not present

## 2024-12-15 DIAGNOSIS — J449 Chronic obstructive pulmonary disease, unspecified: Secondary | ICD-10-CM | POA: Diagnosis not present

## 2024-12-15 DIAGNOSIS — U071 COVID-19: Secondary | ICD-10-CM

## 2024-12-15 DIAGNOSIS — E7849 Other hyperlipidemia: Secondary | ICD-10-CM | POA: Diagnosis not present

## 2024-12-15 DIAGNOSIS — F419 Anxiety disorder, unspecified: Secondary | ICD-10-CM

## 2024-12-15 LAB — BASIC METABOLIC PANEL WITH GFR
Anion gap: 9 (ref 5–15)
BUN: 10 mg/dL (ref 8–23)
CO2: 29 mmol/L (ref 22–32)
Calcium: 9 mg/dL (ref 8.9–10.3)
Chloride: 102 mmol/L (ref 98–111)
Creatinine, Ser: 0.56 mg/dL (ref 0.44–1.00)
GFR, Estimated: >60 mL/min
Glucose, Bld: 129 mg/dL — ABNORMAL HIGH (ref 70–99)
Potassium: 3.8 mmol/L (ref 3.5–5.1)
Sodium: 139 mmol/L (ref 135–145)

## 2024-12-15 LAB — CBC
HCT: 41.2 % (ref 36.0–46.0)
Hemoglobin: 13.2 g/dL (ref 12.0–15.0)
MCH: 27.5 pg (ref 26.0–34.0)
MCHC: 32 g/dL (ref 30.0–36.0)
MCV: 85.8 fL (ref 80.0–100.0)
Platelets: 323 K/uL (ref 150–400)
RBC: 4.8 MIL/uL (ref 3.87–5.11)
RDW: 14.6 % (ref 11.5–15.5)
WBC: 5.9 K/uL (ref 4.0–10.5)
nRBC: 0 % (ref 0.0–0.2)

## 2024-12-15 LAB — HIV ANTIBODY (ROUTINE TESTING W REFLEX): HIV Screen 4th Generation wRfx: NONREACTIVE

## 2024-12-15 LAB — MRSA NEXT GEN BY PCR, NASAL: MRSA by PCR Next Gen: NOT DETECTED

## 2024-12-15 MED ORDER — PREDNISONE 50 MG PO TABS
50.0000 mg | ORAL_TABLET | Freq: Every day | ORAL | Status: AC
  Administered 2024-12-16 – 2024-12-20 (×5): 50 mg via ORAL
  Filled 2024-12-15 (×5): qty 1

## 2024-12-15 MED ORDER — CALCIUM CARBONATE ANTACID 500 MG PO CHEW
1.0000 | CHEWABLE_TABLET | ORAL | Status: DC | PRN
  Administered 2024-12-15 – 2024-12-23 (×6): 200 mg via ORAL
  Filled 2024-12-15 (×6): qty 1

## 2024-12-15 MED ORDER — HYDROXYZINE HCL 25 MG PO TABS
50.0000 mg | ORAL_TABLET | Freq: Four times a day (QID) | ORAL | Status: DC | PRN
  Administered 2024-12-16 – 2024-12-21 (×10): 50 mg via ORAL
  Filled 2024-12-15 (×11): qty 2

## 2024-12-15 MED ORDER — IPRATROPIUM-ALBUTEROL 0.5-2.5 (3) MG/3ML IN SOLN
3.0000 mL | RESPIRATORY_TRACT | Status: AC
  Administered 2024-12-15 (×2): 3 mL via RESPIRATORY_TRACT
  Filled 2024-12-15 (×2): qty 3

## 2024-12-15 NOTE — Progress Notes (Signed)
" °   12/15/24 2302  BiPAP/CPAP/SIPAP  BiPAP/CPAP/SIPAP Pt Type Adult  BiPAP/CPAP/SIPAP Resmed  Mask Type Full face mask  Dentures removed?  (per pt, pt took them out)  Mask Size Medium  Flow Rate 2 lpm  Patient Home Machine No  Patient Home Mask No  Patient Home Tubing No  Auto Titrate Yes  Minimum cmH2O 5 cmH2O  Maximum cmH2O 15 cmH2O  Device Plugged into RED Power Outlet Yes    "

## 2024-12-15 NOTE — Progress Notes (Signed)
 " Progress Note   Patient: Pamela [name] FMW:[id number] DOB: June 01, 1961 DOA: 12/14/2024     1 DOS: the patient was seen and examined on 12/15/2024   Brief hospital course: Pamela [name] is a 64 year old female with history of severe COPD, anxiety, depression, hyperlipidemia, hypothyroid, GERD.  12/14/2024: She presents to the ED for chief concerns of shortness of breath, cough and wheezing for about 48 hours.  Vitals at the time my evaluation showed t of 97.8, rr 18, hr 99, blood pressure 130/106, SpO2 of 92% on 2 L nasal cannula.  Serum sodium is 140, potassium 2.6, chloride 100, bicarb 27, BUN of 10, serum creatinine 0.64, eGFR greater than 60, nonfasting blood glucose 118, WBC 6.5, hemoglobin 14.1, platelets of 324.  Patient tested positive for COVID-19 by PCR.  12/14/2024: Patient admitted to hospital service for chief concerns of COPD exacerbation.  Patient was assessed and H&P completed via telemedicine encounter.  1/20: Vital and labs stable on 3L of oxygen, no baseline oxygen use.  Assessment and Plan: * Acute hypoxic respiratory failure (HCC) Secondary to COVID-19 leading to COPD exacerbation Symptomatic support Solu-Medrol  80 mg IV twice daily, 2 doses followed by prednisone  for 5 days DuoNebs 4 times daily,  DuoNebs every 6 hours as needed for wheezing and shortness of breath, Home Breztri  inhaler twice daily resumed Continue oxygen supplementation to maintain SpO2 greater than 92% Continuous pulse oximetry -Continue supplemental oxygen-wean as tolerated  Chronic obstructive pulmonary disease (HCC) Continue home Breztri  Patient reports she has a nebulizer machine and nebulizer solution at home  COVID-19 virus infection Airborne precautions  Hypothyroidism Home levothyroxine  150 mcg daily before breakfast resumed  Other hyperlipidemia Home atorvastatin  20 mg nightly resumed  Morbid obesity (HCC) Estimated body mass index is 52.11 kg/m as calculated from the  following:   Height as of this encounter: 5' 4 (1.626 m).   Weight as of this encounter: 137.7 kg.   This complicates overall care and prognosis.   Anxiety Home sertraline  150 mg daily, buspirone  20 mg 3 times daily, amitriptyline  100 mg nightly were resumed Lorazepam  0.5 mg IV every 4 hours as needed for anxiety, 3 doses ordered   Subjective: Patient was seen and examined today.  Still feeling short of breath.  Quit smoking last year in August.  No baseline oxygen use.  Physical Exam: Vitals:   12/15/24 0101 12/15/24 0505 12/15/24 0542 12/15/24 0802  BP: 113/65 129/77    Pulse: 71 64    Resp: 20 18    Temp: 98.2 F (36.8 C) 98.1 F (36.7 C)    TempSrc: Oral Oral    SpO2: 92% 92%  92%  Weight:   (!) 137.7 kg   Height:   5' 4 (1.626 m)    General.  Morbidly obese lady, in no acute distress. Pulmonary.  Scattered wheeze bilaterally, normal respiratory effort. CV.  Regular rate and rhythm, no JVD, rub or murmur. Abdomen.  Soft, nontender, nondistended, BS positive. CNS.  Alert and oriented .  No focal neurologic deficit. Extremities.  No edema,  pulses intact and symmetrical. Psychiatry.  Judgment and insight appears normal.   Data Reviewed: Prior data reviewed  Family Communication: Discussed with patient  Disposition: Status is: Inpatient Remains inpatient appropriate because: Severity of illness  Planned Discharge Destination: Home  DVT prophylaxis.  Lovenox  Time spent: 45 minutes  This record has been created using Conservation officer, historic buildings. Errors have been sought and corrected,but may not always be located. Such creation errors  do not reflect on the standard of care.   Author: Amaryllis Dare, MD 12/15/2024 1:25 PM  For on call review [email or web address].  "

## 2024-12-16 DIAGNOSIS — J9601 Acute respiratory failure with hypoxia: Secondary | ICD-10-CM

## 2024-12-16 MED ORDER — ALBUTEROL SULFATE (2.5 MG/3ML) 0.083% IN NEBU
2.5000 mg | INHALATION_SOLUTION | RESPIRATORY_TRACT | Status: DC | PRN
  Administered 2024-12-18 – 2024-12-19 (×3): 2.5 mg via RESPIRATORY_TRACT
  Filled 2024-12-16 (×3): qty 3

## 2024-12-16 MED ORDER — ALBUTEROL SULFATE (2.5 MG/3ML) 0.083% IN NEBU
2.5000 mg | INHALATION_SOLUTION | Freq: Two times a day (BID) | RESPIRATORY_TRACT | Status: DC
  Administered 2024-12-16 – 2024-12-18 (×5): 2.5 mg via RESPIRATORY_TRACT
  Filled 2024-12-16 (×4): qty 3

## 2024-12-16 NOTE — Progress Notes (Signed)
" °   12/16/24 2323  BiPAP/CPAP/SIPAP  BiPAP/CPAP/SIPAP Pt Type Adult  BiPAP/CPAP/SIPAP Resmed  Mask Type Nasal pillows  Dentures removed? Not applicable  Mask Size Small  Flow Rate 3 lpm  Patient Home Machine No  Patient Home Mask No  Patient Home Tubing No  Auto Titrate Yes  Minimum cmH2O 5 cmH2O  Maximum cmH2O 15 cmH2O  Device Plugged into RED Power Outlet Yes    "

## 2024-12-16 NOTE — Progress Notes (Signed)
 " PROGRESS NOTE    [name]  FMW:[id number] DOB: 03/18/1961 DOA: 12/14/2024 PCP: Frann Mabel Mt, DO  Chief Complaint  Patient presents with   Shortness of Breath    Brief Narrative:   [name] is Pamela [name] 64 year old female with history of severe COPD, anxiety, depression, hyperlipidemia, hypothyroid, GERD admitted with COPD exacerbation in setting of COVID 19 infection.  Assessment & Plan:   Principal Problem:   Acute hypoxic respiratory failure (HCC) Active Problems:   Chronic obstructive pulmonary disease (HCC)   COVID-19 virus infection   Hypothyroidism   Other hyperlipidemia   Morbid obesity (HCC)   Anxiety  Acute hypoxic respiratory failure (HCC) COVID-19 infection  COPD exacerbation Continue steroids, scheduled, and prn nebs CXR 1/19 without acute process Continued wheezing on exam Wean O2 as tolerated  Chronic obstructive pulmonary disease (HCC) Continue home Breztri  Patient reports she has Hoorain Kozakiewicz nebulizer machine and nebulizer solution at home   COVID-19 virus infection Airborne precautions   Hypothyroidism Home levothyroxine  150 mcg daily before breakfast resumed   Other hyperlipidemia Home atorvastatin  20 mg nightly resumed   Anxiety Home sertraline  150 mg daily, buspirone  20 mg 3 times daily, amitriptyline  100 mg nightly were resumed Lorazepam  0.5 mg IV every 4 hours as needed for anxiety, 3 doses ordered  Class III Obesity Body mass index is 52.11 kg/m.     DVT prophylaxis: lovenox  Code Status: full Family Communication: none Disposition:   Status is: Inpatient Remains inpatient appropriate because: need for continued inpatient care   Consultants:  none  Procedures:  none  Antimicrobials:  Anti-infectives (From admission, onward)    None       Subjective: C/o SOB  Objective: Vitals:   12/15/24 2300 12/16/24 0528 12/16/24 1135 12/16/24 1245  BP:  (!) 171/102  133/80  Pulse:  69  83  Resp:    (!) 22   Temp:  98.1 F (36.7 C)  98.6 F (37 C)  TempSrc:  Axillary  Oral  SpO2: 95% 97% 96% 98%  Weight:      Height:        Intake/Output Summary (Last 24 hours) at 12/16/2024 1925 Last data filed at 12/16/2024 1842 Gross per 24 hour  Intake 1320 ml  Output --  Net 1320 ml   Filed Weights   12/15/24 0542  Weight: (!) 137.7 kg    Examination:  General exam: obese, comfortable appearing Respiratory system: difficult exam with disposable stethoscope, audible wheezing from bedside Cardiovascular system: RRR Gastrointestinal system: Abdomen is nondistended, soft and nontender. Central nervous system: Alert and oriented. No focal neurological deficits. Extremities: no LEE    Data Reviewed: I have personally reviewed following labs and imaging studies  CBC: Recent Labs  Lab 12/14/24 1219 12/15/24 0513  WBC 6.4 5.9  HGB 14.1 13.2  HCT 43.5 41.2  MCV 85.1 85.8  PLT 324 323    Basic Metabolic Panel: Recent Labs  Lab 12/14/24 1219 12/15/24 0513  NA 140 139  K 3.6 3.8  CL 100 102  CO2 27 29  GLUCOSE 118* 129*  BUN 10 10  CREATININE 0.64 0.56  CALCIUM  8.9 9.0    GFR: Estimated Creatinine Clearance: 98.6 mL/min (by C-G formula based on SCr of 0.56 mg/dL).  Liver Function Tests: No results for input(s): AST, ALT, ALKPHOS, BILITOT, PROT, ALBUMIN  in the last 168 hours.  CBG: No results for input(s): GLUCAP in the last 168 hours.   Recent Results (from the past 240 hours)  Resp  panel by RT-PCR (RSV, Flu Giovonni Poirier&B, Covid) Anterior Nasal Swab     Status: Abnormal   Collection Time: 12/14/24 12:19 PM   Specimen: Anterior Nasal Swab  Result Value Ref Range Status   SARS Coronavirus 2 by RT PCR POSITIVE (Khylen Riolo) NEGATIVE Final    Comment: (NOTE) SARS-CoV-2 target nucleic acids are DETECTED.  The SARS-CoV-2 RNA is generally detectable in upper respiratory specimens during the acute phase of infection. Positive results are indicative of the presence of the  identified virus, but do not rule out bacterial infection or co-infection with other pathogens not detected by the test. Clinical correlation with patient history and other diagnostic information is necessary to determine patient infection status. The expected result is Negative.  Fact Sheet for Patients: bloggercourse.com  Fact Sheet for Healthcare Providers: seriousbroker.it  This test is not yet approved or cleared by the [place]  FDA and  has been authorized for detection and/or diagnosis of SARS-CoV-2 by FDA under an Emergency Use Authorization (EUA).  This EUA will remain in effect (meaning this test can be used) for the duration of  the COVID-19 declaration under Section 564(b)(1) of the Ernesta [place] ct, 21 U.S.C. section 360bbb-3(b)(1), unless the authorization is terminated or revoked sooner.     Influenza [place]a Phariss by PCR NEGATIVE NEGATIVE Final   Influenza B by PCR NEGATIVE NEGATIVE Final    Comment: (NOTE) The Xpert Xpress SARS-CoV-2/FLU/RSV plus assay is intended as an aid in the diagnosis of influenza from Nasopharyngeal swab specimens and should not be used as [place] sole basis for treatment. Nasal washings and aspirates are unacceptable for Xpert Xpress SARS-CoV-2/FLU/RSV testing.  Fact Sheet for Patients: bloggercourse.com  Fact Sheet for Healthcare Providers: seriousbroker.it  This test is not yet approved or cleared by the [place]  FDA and has been authorized for detection and/or diagnosis of SARS-CoV-2 by FDA under an Emergency Use Authorization (EUA). This EUA will remain in effect (meaning this test can be used) for the duration of the COVID-19 declaration under Section 564(b)(1) of the Act, 21 U.S.C. section 360bbb-3(b)(1), unless the authorization is terminated or revoked.     Resp Syncytial Virus by PCR NEGATIVE NEGATIVE Final    Comment: (NOTE) Fact Sheet for  Patients: bloggercourse.com  Fact Sheet for Healthcare Providers: seriousbroker.it  This test is not yet approved or cleared by the [place]  FDA and has been authorized for detection and/or diagnosis of SARS-CoV-2 by FDA under an Emergency Use Authorization (EUA). This EUA will remain in effect (meaning this test can be used) for the duration of the COVID-19 declaration under Section 564(b)(1) of the Act, 21 U.S.C. section 360bbb-3(b)(1), unless the authorization is terminated or revoked.  Performed at [facility], [address] [place], [place] [address]   MRSA Next Gen by PCR, Nasal     Status: None   Collection Time: 12/14/24  5:14 PM   Specimen: Nasal Mucosa; Nasal Swab  Result Value Ref Range Status   MRSA by PCR Next Gen NOT DETECTED NOT DETECTED Final    Comment: (NOTE) The GeneXpert MRSA Assay (FDA approved for NASAL specimens only), is one component of Gavan [place] comprehensive MRSA colonization surveillance program. It is not intended to diagnose MRSA infection nor to guide or monitor treatment for MRSA infections. Test performance is not FDA approved in patients less than 28 years old. Performed at [facility], [address], [place], [place] [address]          Radiology Studies: No results  found.      Scheduled Meds:  albuterol   2.5 mg Nebulization BID   amitriptyline   10 mg Oral QHS   aspirin  EC  81 mg Oral Daily   atorvastatin   20 mg Oral QHS   budesonide -glycopyrrolate -formoterol   2 puff Inhalation BID   busPIRone   20 mg Oral TID   enoxaparin  (LOVENOX ) injection  40 mg Subcutaneous Q24H   levothyroxine   150 mcg Oral QAC breakfast   loratadine   10 mg Oral QPM   melatonin  10 mg Oral QHS   multivitamin with minerals  1 tablet Oral Daily   predniSONE   50 mg Oral Q breakfast   sertraline   150 mg Oral Daily   Continuous Infusions:   LOS: 2 days    Time spent: over 30 min      Meliton Monte, MD [facility]   To contact the attending provider between 7A-7P or the covering provider during after hours 7P-7A, please log into the web site [email or web address] and access using universal [place] password for that web site. If you do not have the password, please call the hospital operator.  12/16/2024, 7:25 PM    "

## 2024-12-17 ENCOUNTER — Inpatient Hospital Stay (HOSPITAL_COMMUNITY)

## 2024-12-17 DIAGNOSIS — J9601 Acute respiratory failure with hypoxia: Secondary | ICD-10-CM | POA: Diagnosis not present

## 2024-12-17 LAB — CBC WITH DIFFERENTIAL/PLATELET
Abs Immature Granulocytes: 0.03 K/uL (ref 0.00–0.07)
Basophils Absolute: 0 K/uL (ref 0.0–0.1)
Basophils Relative: 0 %
Eosinophils Absolute: 0 K/uL (ref 0.0–0.5)
Eosinophils Relative: 0 %
HCT: 41 % (ref 36.0–46.0)
Hemoglobin: 13.2 g/dL (ref 12.0–15.0)
Immature Granulocytes: 0 %
Lymphocytes Relative: 27 %
Lymphs Abs: 1.8 K/uL (ref 0.7–4.0)
MCH: 27.5 pg (ref 26.0–34.0)
MCHC: 32.2 g/dL (ref 30.0–36.0)
MCV: 85.4 fL (ref 80.0–100.0)
Monocytes Absolute: 0.8 K/uL (ref 0.1–1.0)
Monocytes Relative: 12 %
Neutro Abs: 4 K/uL (ref 1.7–7.7)
Neutrophils Relative %: 61 %
Platelets: 305 K/uL (ref 150–400)
RBC: 4.8 MIL/uL (ref 3.87–5.11)
RDW: 14.3 % (ref 11.5–15.5)
WBC: 6.8 K/uL (ref 4.0–10.5)
nRBC: 0 % (ref 0.0–0.2)

## 2024-12-17 LAB — MAGNESIUM: Magnesium: 2.2 mg/dL (ref 1.7–2.4)

## 2024-12-17 LAB — COMPREHENSIVE METABOLIC PANEL WITH GFR
ALT: 11 U/L (ref 0–44)
AST: 16 U/L (ref 15–41)
Albumin: 3.2 g/dL — ABNORMAL LOW (ref 3.5–5.0)
Alkaline Phosphatase: 91 U/L (ref 38–126)
Anion gap: 8 (ref 5–15)
BUN: 12 mg/dL (ref 8–23)
CO2: 29 mmol/L (ref 22–32)
Calcium: 8.7 mg/dL — ABNORMAL LOW (ref 8.9–10.3)
Chloride: 101 mmol/L (ref 98–111)
Creatinine, Ser: 0.67 mg/dL (ref 0.44–1.00)
GFR, Estimated: >60 mL/min
Glucose, Bld: 108 mg/dL — ABNORMAL HIGH (ref 70–99)
Potassium: 3.8 mmol/L (ref 3.5–5.1)
Sodium: 138 mmol/L (ref 135–145)
Total Bilirubin: 0.3 mg/dL (ref 0.0–1.2)
Total Protein: 6.1 g/dL — ABNORMAL LOW (ref 6.5–8.1)

## 2024-12-17 LAB — PHOSPHORUS: Phosphorus: 3.9 mg/dL (ref 2.5–4.6)

## 2024-12-17 MED ORDER — SALINE SPRAY 0.65 % NA SOLN: 1.0000 | NASAL | Status: DC | PRN

## 2024-12-17 NOTE — Progress Notes (Signed)
 " PROGRESS NOTE    Pamela [name]  FMW:[id number] DOB: 10-11-1961 DOA: 12/14/2024 PCP: Frann Mabel Mt, DO  Chief Complaint  Patient presents with   Shortness of Breath    Brief Narrative:   Pamela [name] is Pamela [name] 64 year old female with history of severe COPD, anxiety, depression, hyperlipidemia, hypothyroid, GERD admitted with COPD exacerbation in setting of COVID 19 infection.  Assessment & Plan:   Principal Problem:   Acute hypoxic respiratory failure (HCC) Active Problems:   Chronic obstructive pulmonary disease (HCC)   COVID-19 virus infection   Hypothyroidism   Other hyperlipidemia   Morbid obesity (HCC)   Anxiety  Acute hypoxic respiratory failure (HCC) COVID-19 infection  COPD exacerbation Continue steroids, scheduled, and prn nebs CXR 1/22 without active disease Continued wheezing on exam Wean O2 as tolerated  Chronic obstructive pulmonary disease (HCC) Continue home Breztri  Patient reports she has Pamela [name] nebulizer machine and nebulizer solution at home   COVID-19 virus infection Airborne precautions   Hypothyroidism Home levothyroxine  150 mcg daily before breakfast resumed   Other hyperlipidemia Home atorvastatin  20 mg nightly resumed   Anxiety Home sertraline  150 mg daily, buspirone  20 mg 3 times daily, amitriptyline  100 mg nightly were resumed Lorazepam  0.5 mg IV every 4 hours as needed for anxiety, 3 doses ordered  Class III Obesity Body mass index is 52.11 kg/m.     DVT prophylaxis: lovenox  Code Status: full Family Communication: none Disposition:   Status is: Inpatient Remains inpatient appropriate because: need for continued inpatient care   Consultants:  none  Procedures:  none  Antimicrobials:  Anti-infectives (From admission, onward)    None       Subjective: Feels poorly Coughing up phlegm  Objective: Vitals:   12/16/24 2107 12/17/24 0541 12/17/24 0803 12/17/24 1315  BP: 119/71 (!) 147/97  136/82  Pulse:  72 63  73  Resp:  19  16  Temp: 98.4 F (36.9 C) 98.2 F (36.8 C)  98.6 F (37 C)  TempSrc: Oral Oral  Oral  SpO2: 94% 96% 95% 94%  Weight:      Height:        Intake/Output Summary (Last 24 hours) at 12/17/2024 1702 Last data filed at 12/17/2024 0930 Gross per 24 hour  Intake 720 ml  Output --  Net 720 ml   Filed Weights   12/15/24 0542  Weight: (!) 137.7 kg    Examination:  General: No acute distress. Cardiovascular: RRR Lungs: transmitted upper airway sounds, faint expiratory wheezing - unlabored Neurological: Alert and oriented 3. Moves all extremities 4 with equal strength. Cranial nerves II through XII grossly intact Extremities: No clubbing or cyanosis. No edema.    Data Reviewed: I have personally reviewed following labs and imaging studies  CBC: Recent Labs  Lab 12/14/24 1219 12/15/24 0513 12/17/24 0532  WBC 6.4 5.9 6.8  NEUTROABS  --   --  4.0  HGB 14.1 13.2 13.2  HCT 43.5 41.2 41.0  MCV 85.1 85.8 85.4  PLT 324 323 305    Basic Metabolic Panel: Recent Labs  Lab 12/14/24 1219 12/15/24 0513 12/17/24 0532  NA 140 139 138  K 3.6 3.8 3.8  CL 100 102 101  CO2 27 29 29   GLUCOSE 118* 129* 108*  BUN 10 10 12   CREATININE 0.64 0.56 0.67  CALCIUM  8.9 9.0 8.7*  MG  --   --  2.2  PHOS  --   --  3.9    GFR: Estimated Creatinine Clearance: 98.6 mL/min (  by C-G formula based on SCr of 0.67 mg/dL).  Liver Function Tests: Recent Labs  Lab 12/17/24 0532  AST 16  ALT 11  ALKPHOS 91  BILITOT 0.3  PROT 6.1*  ALBUMIN  3.2*    CBG: No results for input(s): GLUCAP in the last 168 hours.   Recent Results (from the past 240 hours)  Resp panel by RT-PCR (RSV, Flu Pamela [name]&B, Covid) Anterior Nasal Swab     Status: Abnormal   Collection Time: 12/14/24 12:19 PM   Specimen: Anterior Nasal Swab  Result Value Ref Range Status   SARS Coronavirus 2 by RT PCR POSITIVE (Pamela [name]) NEGATIVE Final    Comment: (NOTE) SARS-CoV-2 target nucleic acids are DETECTED.  The  SARS-CoV-2 RNA is generally detectable in upper respiratory specimens during the acute phase of infection. Positive results are indicative of the presence of the identified virus, but do not rule out bacterial infection or co-infection with other pathogens not detected by the test. Clinical correlation with patient history and other diagnostic information is necessary to determine patient infection status. The expected result is Negative.  Fact Sheet for Patients: bloggercourse.com  Fact Sheet for Healthcare Providers: seriousbroker.it  This test is not yet approved or cleared by the [place]  FDA and  has been authorized for detection and/or diagnosis of SARS-CoV-2 by FDA under an Emergency Use Authorization (EUA).  This EUA will remain in effect (meaning this test can be used) for the duration of  the COVID-19 declaration under Section 564(b)(1) of the Pamela [name], 21 U.S.C. section 360bbb-3(b)(1), unless the authorization is terminated or revoked sooner.     Influenza Pamela [name] by PCR NEGATIVE NEGATIVE Final   Influenza B by PCR NEGATIVE NEGATIVE Final    Comment: (NOTE) The Xpert Xpress SARS-CoV-2/FLU/RSV plus assay is intended as an aid in the diagnosis of influenza from Nasopharyngeal swab specimens and should not be used as Pamela [name] sole basis for treatment. Nasal washings and aspirates are unacceptable for Xpert Xpress SARS-CoV-2/FLU/RSV testing.  Fact Sheet for Patients: bloggercourse.com  Fact Sheet for Healthcare Providers: seriousbroker.it  This test is not yet approved or cleared by the [place]  FDA and has been authorized for detection and/or diagnosis of SARS-CoV-2 by FDA under an Emergency Use Authorization (EUA). This EUA will remain in effect (meaning this test can be used) for the duration of the COVID-19 declaration under Section 564(b)(1) of the Pamela [name], 21 U.S.C. section  360bbb-3(b)(1), unless the authorization is terminated or revoked.     Resp Syncytial Virus by PCR NEGATIVE NEGATIVE Final    Comment: (NOTE) Fact Sheet for Patients: bloggercourse.com  Fact Sheet for Healthcare Providers: seriousbroker.it  This test is not yet approved or cleared by the [place]  FDA and has been authorized for detection and/or diagnosis of SARS-CoV-2 by FDA under an Emergency Use Authorization (EUA). This EUA will remain in effect (meaning this test can be used) for the duration of the COVID-19 declaration under Section 564(b)(1) of the Pamela [name], 21 U.S.C. section 360bbb-3(b)(1), unless the authorization is terminated or revoked.  Performed at [facility], [address] [place], [place] [address]   MRSA Next Gen by PCR, Nasal     Status: None   Collection Time: 12/14/24  5:14 PM   Specimen: Nasal Mucosa; Nasal Swab  Result Value Ref Range Status   MRSA by PCR Next Gen NOT DETECTED NOT DETECTED Final    Comment: (NOTE) The GeneXpert MRSA Assay (FDA approved for NASAL specimens only), is one component of  Brandon Wiechman comprehensive MRSA colonization surveillance program. It is not intended to diagnose MRSA infection nor to guide or monitor treatment for MRSA infections. Test performance is not FDA approved in patients less than 38 years old. Performed at [facility], [address] [place], [place] [address]          Radiology Studies: DG CHEST PORT 1 VIEW Result Date: 12/17/2024 CLINICAL DATA:  Cough EXAM: PORTABLE CHEST 1 VIEW COMPARISON:  December 14, 2024 FINDINGS: Stable cardiomediastinal silhouette. Both lungs are clear. The visualized skeletal structures are unremarkable. IMPRESSION: No active disease. Electronically Signed   By: Lynwood Landy Raddle M.D.   On: 12/17/2024 15:54        Scheduled Meds:  albuterol   2.5 mg Nebulization BID   amitriptyline   10 mg Oral QHS   aspirin  EC  81 mg  Oral Daily   atorvastatin   20 mg Oral QHS   budesonide -glycopyrrolate -formoterol   2 puff Inhalation BID   busPIRone   20 mg Oral TID   enoxaparin  (LOVENOX ) injection  40 mg Subcutaneous Q24H   levothyroxine   150 mcg Oral QAC breakfast   loratadine   10 mg Oral QPM   melatonin  10 mg Oral QHS   multivitamin with minerals  1 tablet Oral Daily   predniSONE   50 mg Oral Q breakfast   sertraline   150 mg Oral Daily   Continuous Infusions:   LOS: 3 days    Time spent: over 30 min     Meliton Monte, MD [facility]   To contact the attending provider between 7A-7P or the covering provider during after hours 7P-7A, please log into the web site [email or web address] and access using universal [place] password for that web site. If you do not have the password, please call the hospital operator.  12/17/2024, 5:02 PM    "

## 2024-12-17 NOTE — Progress Notes (Signed)
" °   12/17/24 1948  BiPAP/CPAP/SIPAP  BiPAP/CPAP/SIPAP Pt Type Adult  BiPAP/CPAP/SIPAP Resmed (self adm)  Mask Type Nasal mask  Dentures removed? Not applicable  Mask Size Small  Flow Rate 3 lpm  Patient Home Machine No  Patient Home Mask No  Patient Home Tubing No  Auto Titrate Yes  Minimum cmH2O 5 cmH2O  Maximum cmH2O 15 cmH2O  CPAP/SIPAP surface wiped down Yes  Device Plugged into RED Power Outlet Yes  BiPAP/CPAP /SiPAP Vitals  Resp 17  Bilateral Breath Sounds Expiratory wheezes  MEWS Score/Color  MEWS Score 0  MEWS Score Color Green    "

## 2024-12-17 NOTE — TOC Initial Note (Signed)
 Transition of Care [facility]) - Initial/Assessment Note    Patient Details  Name: Pamela [name] MRN: [id number] Date of Birth: March 20, 1961  Transition of Care [facility]) CM/SW Contact:    Bascom Service, RN Phone Number: 12/17/2024, 2:41 PM  Clinical Narrative:  d/c plan home. Has own transport home.                 Expected Discharge Plan: Home/Self Care Barriers to Discharge: Continued Medical Work up   Patient Goals and CMS Choice Patient states their goals for this hospitalization and ongoing recovery are:: Home CMS Medicare.gov Compare Post Acute Care list provided to:: Patient Choice offered to / list presented to : Patient [place] ownership interest in [facility].provided to:: Patient    Expected Discharge Plan and Services   Discharge Planning Services: CM Consult   Living arrangements for the past 2 months: Single Family Home                                      Prior Living Arrangements/Services Living arrangements for the past 2 months: Single Family Home Lives with:: Self                   Activities of Daily Living   ADL Screening (condition at time of admission) Independently performs ADLs?: Yes (appropriate for developmental age) Is the patient deaf or have difficulty hearing?: No Does the patient have difficulty seeing, even when wearing glasses/contacts?: No Does the patient have difficulty concentrating, remembering, or making decisions?: No  Permission Sought/Granted                  Emotional Assessment              Admission diagnosis:  Abdominal cramping [R10.9] Chronic abdominal pain [R10.9, G89.29] Acute respiratory failure with hypoxia (HCC) [J96.01] Tonsillar abscess [J36] Chronic obstructive pulmonary disease with acute exacerbation (HCC) [J44.1] Other hyperlipidemia [E78.49] Hypothyroidism, unspecified type [E03.9] Chronic obstructive pulmonary disease, unspecified COPD type (HCC) [J44.9] Acute  hypoxic respiratory failure (HCC) [J96.01] COVID-19 [U07.1] Patient Active Problem List   Diagnosis Date Noted   Acute hypoxic respiratory failure (HCC) 12/14/2024   COVID-19 virus infection 12/14/2024   Tonsillar abscess 05/03/2023   Anxiety 05/03/2023   Tongue lesion 05/03/2023   Strain of right biceps 04/03/2021   Bilateral hearing loss 04/03/2021   Prediabetes 04/03/2021   Other hyperlipidemia 04/03/2021   Vitamin D  deficiency 06/23/2018   Insulin  resistance 06/23/2018   Morbid obesity (HCC) 03/13/2018   COPD (chronic obstructive pulmonary disease) (HCC)    Chronic obstructive pulmonary disease (HCC) 01/27/2018   Chronic pain of left ankle 01/27/2018   Hypothyroidism 01/27/2018   PCP:  Frann Mabel Mt, DO Pharmacy:   [facility] [place] [facility] [address] [place] [address] Phone: [phone] Fax: [phone]  [id number] [place], [place] - [address] AT [facility] [address] [place] [address] Phone: [phone] Fax: [phone]  [facility] [place], [place] - [address] [place] [address] Phone: [phone] Fax: [phone]     Social Drivers of Health (SDOH) Social History: SDOH Screenings   Food Insecurity: Unknown (12/16/2024)  Recent Concern: Food Insecurity - Food Insecurity Present (12/14/2024)  Housing: Low Risk (12/14/2024)  Transportation Needs: No Transportation Needs (12/14/2024)  Utilities:  Not At Risk (12/14/2024)  Depression (PHQ2-9): High Risk (11/04/2024)  Financial Resource Strain: Medium Risk (03/23/2024)  Physical Activity: Insufficiently Active (03/23/2024)  Social Connections: Moderately Integrated (03/23/2024)  Stress: Stress Concern Present (03/23/2024)  Tobacco Use: Medium Risk (12/14/2024)   SDOH Interventions: Food Insecurity Interventions: Intervention Not Indicated, Inpatient TOC   Readmission Risk Interventions      No data to display

## 2024-12-18 DIAGNOSIS — J9601 Acute respiratory failure with hypoxia: Secondary | ICD-10-CM | POA: Diagnosis not present

## 2024-12-18 LAB — CBC WITH DIFFERENTIAL/PLATELET
Abs Immature Granulocytes: 0.05 K/uL (ref 0.00–0.07)
Basophils Absolute: 0.1 K/uL (ref 0.0–0.1)
Basophils Relative: 1 %
Eosinophils Absolute: 0.1 K/uL (ref 0.0–0.5)
Eosinophils Relative: 1 %
HCT: 42.7 % (ref 36.0–46.0)
Hemoglobin: 13.7 g/dL (ref 12.0–15.0)
Immature Granulocytes: 1 %
Lymphocytes Relative: 33 %
Lymphs Abs: 2.7 K/uL (ref 0.7–4.0)
MCH: 27.8 pg (ref 26.0–34.0)
MCHC: 32.1 g/dL (ref 30.0–36.0)
MCV: 86.8 fL (ref 80.0–100.0)
Monocytes Absolute: 1 K/uL (ref 0.1–1.0)
Monocytes Relative: 12 %
Neutro Abs: 4.3 K/uL (ref 1.7–7.7)
Neutrophils Relative %: 52 %
Platelets: 335 K/uL (ref 150–400)
RBC: 4.92 MIL/uL (ref 3.87–5.11)
RDW: 14.3 % (ref 11.5–15.5)
WBC: 8.1 K/uL (ref 4.0–10.5)
nRBC: 0 % (ref 0.0–0.2)

## 2024-12-18 LAB — MAGNESIUM: Magnesium: 2.2 mg/dL (ref 1.7–2.4)

## 2024-12-18 LAB — COMPREHENSIVE METABOLIC PANEL WITH GFR
ALT: 12 U/L (ref 0–44)
AST: 16 U/L (ref 15–41)
Albumin: 3.7 g/dL (ref 3.5–5.0)
Alkaline Phosphatase: 99 U/L (ref 38–126)
Anion gap: 9 (ref 5–15)
BUN: 16 mg/dL (ref 8–23)
CO2: 31 mmol/L (ref 22–32)
Calcium: 8.9 mg/dL (ref 8.9–10.3)
Chloride: 101 mmol/L (ref 98–111)
Creatinine, Ser: 0.79 mg/dL (ref 0.44–1.00)
GFR, Estimated: >60 mL/min
Glucose, Bld: 110 mg/dL — ABNORMAL HIGH (ref 70–99)
Potassium: 3.5 mmol/L (ref 3.5–5.1)
Sodium: 141 mmol/L (ref 135–145)
Total Bilirubin: 0.2 mg/dL (ref 0.0–1.2)
Total Protein: 6.5 g/dL (ref 6.5–8.1)

## 2024-12-18 LAB — C-REACTIVE PROTEIN: CRP: 0.5 mg/dL

## 2024-12-18 LAB — PHOSPHORUS: Phosphorus: 5.1 mg/dL — ABNORMAL HIGH (ref 2.5–4.6)

## 2024-12-18 MED ORDER — ALBUTEROL SULFATE (2.5 MG/3ML) 0.083% IN NEBU
2.5000 mg | INHALATION_SOLUTION | Freq: Three times a day (TID) | RESPIRATORY_TRACT | Status: DC
  Administered 2024-12-18 – 2024-12-24 (×15): 2.5 mg via RESPIRATORY_TRACT
  Filled 2024-12-18 (×16): qty 3

## 2024-12-18 NOTE — Plan of Care (Signed)
   Problem: Education: Goal: Knowledge of risk factors and measures for prevention of condition will improve Outcome: Progressing   Problem: Coping: Goal: Psychosocial and spiritual needs will be supported Outcome: Progressing   Problem: Respiratory: Goal: Will maintain a patent airway Outcome: Progressing

## 2024-12-18 NOTE — Progress Notes (Signed)
" °   12/18/24 2136  BiPAP/CPAP/SIPAP  BiPAP/CPAP/SIPAP Pt Type Adult (pt prefers self placement)  BiPAP/CPAP/SIPAP Resmed  Mask Type Nasal mask  Dentures removed? Not applicable  Mask Size Small  Flow Rate 3 lpm  Patient Home Machine No  Patient Home Mask No  Patient Home Tubing No  Auto Titrate Yes  Minimum cmH2O 5 cmH2O  Maximum cmH2O 15 cmH2O  Device Plugged into RED Power Outlet Yes    "

## 2024-12-18 NOTE — Evaluation (Signed)
 Physical Therapy Evaluation Patient Details Name: Pamela [name] MRN: [id number] DOB: [birth year] Today's Date: 12/18/2024  History of Present Illness  64 yo female presents to therapy following hospital admission on 12/14/2024 due to SOB. Pt found to have acute respiratory failure secondary to COVID and COPD exacerbation.  Pt PMH includes but is not limited to: anemia, anxiety, asthma, COPD, depression, GERD, HLD, hypothyroidism, LBP, and OSA on CPAP.  Clinical Impression  Pt admitted with above diagnosis.  Pt currently with functional limitations due to the deficits listed below (see PT Problem List). Pt seated in recliner when PT arrived. Pt on 3.5 L/min and O2 saturation 94%, pt states that she is fine at rest and agreeable to doffing [place] tube with pt 93% at rest on RA. Pt reported difficulty with breathing and having to implement energy conservation strategies and has been seeing OP pulmonologist. Pt expressed element of fear when she is SOB.  Gait assessment on RA, no AD no overt LOB 3 therapeutic standing rest breaks and coaching for pursed lip breathing with pt amb 200 feet and desaturating to 85% on RA, quickly recovered once seated and pt requested to don supplemental O2 at end of session for comfort. Pt left seated in recliner and all needs in place. Pt will benefit from acute skilled PT to increase their independence and safety with mobility to allow discharge.         If plan is discharge home, recommend the following: A little help with walking and/or transfers;A little help with bathing/dressing/bathroom;Assistance with cooking/housework;Assist for transportation   Can travel by private vehicle        Equipment Recommendations None recommended by PT  Recommendations for Other Services       Functional Status Assessment Patient has had a recent decline in their functional status and demonstrates the ability to make significant improvements in function in a reasonable and  predictable amount of time.     Precautions / Restrictions Precautions Precautions: Fall (monitor O2) Restrictions Weight Bearing Restrictions Per Provider Order: No      Mobility  Bed Mobility               General bed mobility comments: seated in recliner when PT arrived    Transfers Overall transfer level: Modified independent Equipment used: None                    Ambulation/Gait Ambulation/Gait assistance: Supervision Gait Distance (Feet): 200 Feet Assistive device: None Gait Pattern/deviations: Step-through pattern Gait velocity: cues for decreasing cadence     General Gait Details: no AD and on RA (93% at rest on RA and 94% on RA at 3.5 L/min) and no overt LOB, 3 standing rest breaks due to SOB, pt reports feeling anxious when SOB and has been ongoing for over 6 months with pt unaware of COVID + prior to arrival to hospital , PT coached pt on pursed lip breathing and decreasing RR with RR 27 and HR up to 105 and O2 saturation decreased to 85% on RA once seated pt able to recover to 91% on RA with breathing stratagies. pt reported feeling more safe and comfortable with supplemental O2 donned and PT placed [place] tube and at 2 L/min O2 saturation 94%  Stairs            Wheelchair Mobility     Tilt Bed    Modified Rankin (Stroke Patients Only)       Balance Overall balance assessment: No apparent  balance deficits (not formally assessed)                                           Pertinent Vitals/Pain Pain Assessment Pain Assessment: No/denies pain    Home Living Family/patient expects to be discharged to:: Private residence Living Arrangements: Alone Available Help at Discharge: Family Type of Home: Apartment Home Access: Level entry       Home Layout: One level Home Equipment: Shower seat;Grab bars - tub/shower      Prior Function Prior Level of Function : Independent/Modified Independent;Driving              Mobility Comments: IND no AD for all ADLs, self care tasks and IADLs       Extremity/Trunk Assessment        Lower Extremity Assessment Lower Extremity Assessment: Generalized weakness    Cervical / Trunk Assessment Cervical / Trunk Assessment: Normal  Communication   Communication Communication: No apparent difficulties    Cognition Arousal: Alert Behavior During Therapy: WFL for tasks assessed/performed   PT - Cognitive impairments: No apparent impairments                         Following commands: Intact       Cueing       General Comments      Exercises     Assessment/Plan    PT Assessment Patient needs continued PT services  PT Problem List Decreased activity tolerance;Decreased mobility;Cardiopulmonary status limiting activity       PT Treatment Interventions Gait training;Functional mobility training;Therapeutic activities;Therapeutic exercise;Balance training;Neuromuscular re-education;Patient/family education    PT Goals (Current goals can be found in the Care Plan section)  Acute Rehab PT Goals Patient Stated Goal: to be able to feel like I breathe again PT Goal Formulation: With patient Time For Goal Achievement: 01/01/25 Potential to Achieve Goals: Good    Frequency Min 3X/week     Co-evaluation               AM-PAC PT 6 Clicks Mobility  Outcome Measure Help needed turning from your back to your side while in a flat bed without using bedrails?: None Help needed moving from lying on your back to sitting on the side of a flat bed without using bedrails?: None Help needed moving to and from a bed to a chair (including a wheelchair)?: None Help needed standing up from a chair using your arms (e.g., wheelchair or bedside chair)?: None Help needed to walk in hospital room?: A Little Help needed climbing 3-5 steps with a railing? : A Lot 6 Click Score: 21    End of Session Equipment Utilized During Treatment: Gait  belt Activity Tolerance: Other (comment) (SOB) Patient left: in chair;with call bell/phone within reach Nurse Communication: Mobility status PT Visit Diagnosis: Difficulty in walking, not elsewhere classified (R26.2)    Time: 8769-8742 PT Time Calculation (min) (ACUTE ONLY): 27 min   Charges:   PT Evaluation $PT Eval Low Complexity: 1 Low PT Treatments $Gait Training: 8-22 mins PT General Charges $$ ACUTE PT VISIT: 1 Visit         [place], PT Acute Rehab   [place] VEAR Drone 12/18/2024, 1:25 PM

## 2024-12-18 NOTE — Progress Notes (Signed)
 " PROGRESS NOTE    Pamela [name]  FMW:[id number] DOB: 1961/04/29 DOA: 12/14/2024 PCP: Frann Mabel Mt, DO  Chief Complaint  Patient presents with   Shortness of Breath    Brief Narrative:   Pamela [name] is Pamela [name] 64 year old female with history of severe COPD, anxiety, depression, hyperlipidemia, hypothyroid, GERD admitted with COPD exacerbation in setting of COVID 19 infection.  Assessment & Plan:   Principal Problem:   Acute hypoxic respiratory failure (HCC) Active Problems:   Chronic obstructive pulmonary disease (HCC)   COVID-19 virus infection   Hypothyroidism   Other hyperlipidemia   Morbid obesity (HCC)   Anxiety  Acute hypoxic respiratory failure (HCC) COVID-19 infection  COPD exacerbation Continue steroids, scheduled, and prn nebs CXR 1/22 without active disease Again, continued wheezing on exam today Wean O2 as tolerated - she remains on 3 L  Chronic obstructive pulmonary disease (HCC) Continue home Breztri  Patient reports she has Pamela [name] nebulizer machine and nebulizer solution at home   COVID-19 virus infection Airborne precautions   Hypothyroidism Home levothyroxine  150 mcg daily before breakfast resumed   Other hyperlipidemia Home atorvastatin  20 mg nightly resumed   Anxiety Home sertraline  150 mg daily, buspirone  20 mg 3 times daily, amitriptyline  100 mg nightly were resumed  Class III Obesity Body mass index is 52.11 kg/m.     DVT prophylaxis: lovenox  Code Status: full Family Communication: none Disposition:   Status is: Inpatient Remains inpatient appropriate because: need for continued inpatient care   Consultants:  none  Procedures:  none  Antimicrobials:  Anti-infectives (From admission, onward)    None       Subjective: Issues with cpap overnight Continues to feel poorly, anxious  Objective: Vitals:   12/18/24 0603 12/18/24 0617 12/18/24 0952 12/18/24 1410  BP: 125/76 137/81  137/78  Pulse: 60 63  86   Resp:  19  20  Temp: 97.8 F (36.6 C) 97.9 F (36.6 C)  98.6 F (37 C)  TempSrc: Axillary Axillary  Oral  SpO2: 100% 98% 97% 94%  Weight:      Height:        Intake/Output Summary (Last 24 hours) at 12/18/2024 1459 Last data filed at 12/17/2024 2115 Gross per 24 hour  Intake 240 ml  Output --  Net 240 ml   Filed Weights   12/15/24 0542  Weight: (!) 137.7 kg    Examination:  General: No acute distress. Cardiovascular: RRR Lungs: continued wheezing and transmitted upper airway sounds Neurological: Alert and oriented 3. Moves all extremities 4 with equal strength. Cranial nerves II through XII grossly intact. Extremities: No clubbing or cyanosis. No edema.   Data Reviewed: I have personally reviewed following labs and imaging studies  CBC: Recent Labs  Lab 12/14/24 1219 12/15/24 0513 12/17/24 0532 12/18/24 0520  WBC 6.4 5.9 6.8 8.1  NEUTROABS  --   --  4.0 4.3  HGB 14.1 13.2 13.2 13.7  HCT 43.5 41.2 41.0 42.7  MCV 85.1 85.8 85.4 86.8  PLT 324 323 305 335    Basic Metabolic Panel: Recent Labs  Lab 12/14/24 1219 12/15/24 0513 12/17/24 0532 12/18/24 0520  NA 140 139 138 141  K 3.6 3.8 3.8 3.5  CL 100 102 101 101  CO2 27 29 29 31   GLUCOSE 118* 129* 108* 110*  BUN 10 10 12 16   CREATININE 0.64 0.56 0.67 0.79  CALCIUM  8.9 9.0 8.7* 8.9  MG  --   --  2.2 2.2  PHOS  --   --  3.9 5.1*    GFR: Estimated Creatinine Clearance: 98.6 mL/min (by C-G formula based on SCr of 0.79 mg/dL).  Liver Function Tests: Recent Labs  Lab 12/17/24 0532 12/18/24 0520  AST 16 16  ALT 11 12  ALKPHOS 91 99  BILITOT 0.3 0.2  PROT 6.1* 6.5  ALBUMIN  3.2* 3.7    CBG: No results for input(s): GLUCAP in the last 168 hours.   Recent Results (from the past 240 hours)  Resp panel by RT-PCR (RSV, Flu Pamela [name]&B, Covid) Anterior Nasal Swab     Status: Abnormal   Collection Time: 12/14/24 12:19 PM   Specimen: Anterior Nasal Swab  Result Value Ref Range Status   SARS Coronavirus  2 by RT PCR POSITIVE (Pamela [name]) NEGATIVE Final    Comment: (NOTE) SARS-CoV-2 target nucleic acids are DETECTED.  The SARS-CoV-2 RNA is generally detectable in upper respiratory specimens during the acute phase of infection. Positive results are indicative of the presence of the identified virus, but do not rule out bacterial infection or co-infection with other pathogens not detected by the test. Clinical correlation with patient history and other diagnostic information is necessary to determine patient infection status. The expected result is Negative.  Fact Sheet for Patients: bloggercourse.com  Fact Sheet for Healthcare Providers: seriousbroker.it  This test is not yet approved or cleared by the [place]  FDA and  has been authorized for detection and/or diagnosis of SARS-CoV-2 by FDA under an Emergency Use Authorization (EUA).  This EUA will remain in effect (meaning this test can be used) for the duration of  the COVID-19 declaration under Section 564(b)(1) of the [place] ct, 21 U.S.C. section 360bbb-3(b)(1), unless the authorization is terminated or revoked sooner.     Influenza Pamela [name] by PCR NEGATIVE NEGATIVE Final   Influenza B by PCR NEGATIVE NEGATIVE Final    Comment: (NOTE) The Xpert Xpress SARS-CoV-2/FLU/RSV plus assay is intended as an aid in the diagnosis of influenza from Nasopharyngeal swab specimens and should not be used as Pamela [name] sole basis for treatment. Nasal washings and aspirates are unacceptable for Xpert Xpress SARS-CoV-2/FLU/RSV testing.  Fact Sheet for Patients: bloggercourse.com  Fact Sheet for Healthcare Providers: seriousbroker.it  This test is not yet approved or cleared by the [place]  FDA and has been authorized for detection and/or diagnosis of SARS-CoV-2 by FDA under an Emergency Use Authorization (EUA). This EUA will remain in effect (meaning this test  can be used) for the duration of the COVID-19 declaration under Section 564(b)(1) of the Act, 21 U.S.C. section 360bbb-3(b)(1), unless the authorization is terminated or revoked.     Resp Syncytial Virus by PCR NEGATIVE NEGATIVE Final    Comment: (NOTE) Fact Sheet for Patients: bloggercourse.com  Fact Sheet for Healthcare Providers: seriousbroker.it  This test is not yet approved or cleared by the [place]  FDA and has been authorized for detection and/or diagnosis of SARS-CoV-2 by FDA under an Emergency Use Authorization (EUA). This EUA will remain in effect (meaning this test can be used) for the duration of the COVID-19 declaration under Section 564(b)(1) of the Act, 21 U.S.C. section 360bbb-3(b)(1), unless the authorization is terminated or revoked.  Performed at [facility], [address] [place], [place] [address]   MRSA Next Gen by PCR, Nasal     Status: None   Collection Time: 12/14/24  5:14 PM   Specimen: Nasal Mucosa; Nasal Swab  Result Value Ref Range Status   MRSA by PCR Next Gen NOT DETECTED NOT DETECTED Final  Comment: (NOTE) The GeneXpert MRSA Assay (FDA approved for NASAL specimens only), is one component of Pamela [name] comprehensive MRSA colonization surveillance program. It is not intended to diagnose MRSA infection nor to guide or monitor treatment for MRSA infections. Test performance is not FDA approved in patients less than 28 years old. Performed at [facility], [address] [place], [place] [address]          Radiology Studies: DG CHEST PORT 1 VIEW Result Date: 12/17/2024 CLINICAL DATA:  Cough EXAM: PORTABLE CHEST 1 VIEW COMPARISON:  December 14, 2024 FINDINGS: Stable cardiomediastinal silhouette. Both lungs are clear. The visualized skeletal structures are unremarkable. IMPRESSION: No active disease. Electronically Signed   By: Pamela [name] M.D.   On: 12/17/2024 15:54         Scheduled Meds:  albuterol   2.5 mg Nebulization BID   amitriptyline   10 mg Oral QHS   aspirin  EC  81 mg Oral Daily   atorvastatin   20 mg Oral QHS   budesonide -glycopyrrolate -formoterol   2 puff Inhalation BID   busPIRone   20 mg Oral TID   enoxaparin  (LOVENOX ) injection  40 mg Subcutaneous Q24H   levothyroxine   150 mcg Oral QAC breakfast   loratadine   10 mg Oral QPM   melatonin  10 mg Oral QHS   multivitamin with minerals  1 tablet Oral Daily   predniSONE   50 mg Oral Q breakfast   sertraline   150 mg Oral Daily   Continuous Infusions:   LOS: 4 days    Time spent: over 30 min     Meliton Monte, MD [facility]   To contact the attending provider between 7A-7P or the covering provider during after hours 7P-7A, please log into the web site [email or web address] and access using universal [place] password for that web site. If you do not have the password, please call the hospital operator.  12/18/2024, 2:59 PM    "

## 2024-12-18 NOTE — Progress Notes (Signed)
 OT Cancellation Note  Patient Details Name: Pamela [name] MRN: [id number] DOB: Mar 14, 1961   Cancelled Treatment:    Reason Eval/Treat Not Completed: OT screened, no needs identified, will sign off Per PT, patient has no acute OT needs. Patient is ambulating, and main concern is oxygen saturation levels. OT will sign off at this time, and promoting work with PT and mobility during this acute stay. Please re-consult if further acute needs arise.   Pamela [name], OTR/L Acute Rehabilitation Services [phone]   Pamela [name] Salt 12/18/2024, 2:02 PM

## 2024-12-19 ENCOUNTER — Inpatient Hospital Stay (HOSPITAL_COMMUNITY)

## 2024-12-19 DIAGNOSIS — J9601 Acute respiratory failure with hypoxia: Secondary | ICD-10-CM | POA: Diagnosis not present

## 2024-12-19 LAB — CBC WITH DIFFERENTIAL/PLATELET
Abs Immature Granulocytes: 0.07 10*3/uL (ref 0.00–0.07)
Basophils Absolute: 0.1 10*3/uL (ref 0.0–0.1)
Basophils Relative: 1 %
Eosinophils Absolute: 0.1 10*3/uL (ref 0.0–0.5)
Eosinophils Relative: 1 %
HCT: 42.5 % (ref 36.0–46.0)
Hemoglobin: 13.1 g/dL (ref 12.0–15.0)
Immature Granulocytes: 1 %
Lymphocytes Relative: 32 %
Lymphs Abs: 3 10*3/uL (ref 0.7–4.0)
MCH: 27.1 pg (ref 26.0–34.0)
MCHC: 30.8 g/dL (ref 30.0–36.0)
MCV: 88 fL (ref 80.0–100.0)
Monocytes Absolute: 0.8 10*3/uL (ref 0.1–1.0)
Monocytes Relative: 8 %
Neutro Abs: 5.5 10*3/uL (ref 1.7–7.7)
Neutrophils Relative %: 57 %
Platelets: 346 10*3/uL (ref 150–400)
RBC: 4.83 MIL/uL (ref 3.87–5.11)
RDW: 14.2 % (ref 11.5–15.5)
WBC: 9.5 10*3/uL (ref 4.0–10.5)
nRBC: 0 % (ref 0.0–0.2)

## 2024-12-19 LAB — COMPREHENSIVE METABOLIC PANEL WITH GFR
ALT: 15 U/L (ref 0–44)
AST: 18 U/L (ref 15–41)
Albumin: 3.4 g/dL — ABNORMAL LOW (ref 3.5–5.0)
Alkaline Phosphatase: 98 U/L (ref 38–126)
Anion gap: 8 (ref 5–15)
BUN: 14 mg/dL (ref 8–23)
CO2: 33 mmol/L — ABNORMAL HIGH (ref 22–32)
Calcium: 8.9 mg/dL (ref 8.9–10.3)
Chloride: 100 mmol/L (ref 98–111)
Creatinine, Ser: 0.67 mg/dL (ref 0.44–1.00)
GFR, Estimated: >60 mL/min
Glucose, Bld: 110 mg/dL — ABNORMAL HIGH (ref 70–99)
Potassium: 3.8 mmol/L (ref 3.5–5.1)
Sodium: 141 mmol/L (ref 135–145)
Total Bilirubin: 0.3 mg/dL (ref 0.0–1.2)
Total Protein: 6.2 g/dL — ABNORMAL LOW (ref 6.5–8.1)

## 2024-12-19 LAB — MAGNESIUM: Magnesium: 2.2 mg/dL (ref 1.7–2.4)

## 2024-12-19 LAB — PRO BRAIN NATRIURETIC PEPTIDE: Pro Brain Natriuretic Peptide: <50 pg/mL

## 2024-12-19 LAB — PHOSPHORUS: Phosphorus: 4.2 mg/dL (ref 2.5–4.6)

## 2024-12-19 MED ORDER — IOHEXOL 350 MG/ML SOLN
75.0000 mL | Freq: Once | INTRAVENOUS | Status: AC | PRN
  Administered 2024-12-19: 75 mL via INTRAVENOUS

## 2024-12-19 NOTE — Progress Notes (Signed)
" °   12/19/24 2317  BiPAP/CPAP/SIPAP  BiPAP/CPAP/SIPAP Pt Type Adult  BiPAP/CPAP/SIPAP Resmed (prefers self placement)  Mask Type Nasal mask  Dentures removed? Not applicable  Mask Size Small  Flow Rate 3 lpm  Patient Home Machine No  Patient Home Mask No  Patient Home Tubing No  Auto Titrate Yes  Minimum cmH2O 5 cmH2O  Maximum cmH2O 12 cmH2O  Device Plugged into RED Power Outlet Yes    "

## 2024-12-19 NOTE — Plan of Care (Signed)
   Problem: Education: Goal: Knowledge of risk factors and measures for prevention of condition will improve Outcome: Progressing   Problem: Coping: Goal: Psychosocial and spiritual needs will be supported Outcome: Progressing   Problem: Respiratory: Goal: Will maintain a patent airway Outcome: Progressing

## 2024-12-19 NOTE — Progress Notes (Signed)
 " PROGRESS NOTE    Pamela Pamela [name]  FMW:[id number] DOB: [birth year] DOA: 12/14/2024 PCP: Pamela Pamela [name]  Chief Complaint  Patient presents with   Shortness of Breath    Brief Narrative:   Pamela Pamela [name] is General Wearing 64 year old Pamela [name] with history of severe COPD, anxiety, depression, hyperlipidemia, hypothyroid, GERD admitted with COPD exacerbation in setting of COVID 19 infection.  Assessment & Plan:   Principal Problem:   Acute hypoxic respiratory failure (HCC) Active Problems:   Chronic obstructive pulmonary disease (HCC)   COVID-19 virus infection   Hypothyroidism   Other hyperlipidemia   Morbid obesity (HCC)   Anxiety  Acute hypoxic respiratory failure (HCC) COVID-19 infection  COPD exacerbation Continue steroids, scheduled, and prn nebs CXR 1/22 without active disease Reports worse SOB today, CT PE protocol without PE Anxiety is playing Pamela Pamela [name] role, continue reassurance and supportive care Wean O2 as tolerated - she remains on 3 L - will try to wean this   Chronic obstructive pulmonary disease (HCC) Continue home Breztri  Patient reports she has Pamela Pamela [name] nebulizer machine and nebulizer solution at home   COVID-19 virus infection Airborne precautions   Hypothyroidism Home levothyroxine  150 mcg daily before breakfast resumed   Other hyperlipidemia Home atorvastatin  20 mg nightly resumed   Anxiety Home sertraline  150 mg daily, buspirone  20 mg 3 times daily, amitriptyline  100 mg nightly were resumed  Class III Obesity Body mass index is 52.11 kg/m.     DVT prophylaxis: lovenox  Code Status: full Family Communication: none Disposition:   Status is: Inpatient Remains inpatient appropriate because: need for continued inpatient care   Consultants:  none  Procedures:  none  Antimicrobials:  Anti-infectives (From admission, onward)    None       Subjective:  Feels poorly Sputum is yellow   Objective: Vitals:   12/18/24 2035 12/18/24 2106  12/19/24 0538 12/19/24 1148  BP:  (!) 142/95 (!) 147/95   Pulse:  95 80   Resp:  17 19   Temp:  97.9 F (36.6 C) 98.4 F (36.9 C)   TempSrc:  Oral Oral   SpO2: 99% 93% 94% 95%  Weight:      Height:        Intake/Output Summary (Last 24 hours) at 12/19/2024 1322 Last data filed at 12/19/2024 0907 Gross per 24 hour  Intake 1080 ml  Output --  Net 1080 ml   Filed Weights   12/15/24 0542  Weight: (!) 137.7 kg    Examination:  General: No acute distress. Cardiovascular: RRR Lungs: diffuse scattered wheezing - transmitted upper airway sounds Neurological: Alert and oriented 3. Moves all extremities 4 with equal strength. Cranial nerves II through XII grossly intact. Extremities: No clubbing or cyanosis. No edema.   Data Reviewed: I have personally reviewed following labs and imaging studies  CBC: Recent Labs  Lab 12/14/24 1219 12/15/24 0513 12/17/24 0532 12/18/24 0520 12/19/24 0503  WBC 6.4 5.9 6.8 8.1 9.5  NEUTROABS  --   --  4.0 4.3 5.5  HGB 14.1 13.2 13.2 13.7 13.1  HCT 43.5 41.2 41.0 42.7 42.5  MCV 85.1 85.8 85.4 86.8 88.0  PLT 324 323 305 335 346    Basic Metabolic Panel: Recent Labs  Lab 12/14/24 1219 12/15/24 0513 12/17/24 0532 12/18/24 0520 12/19/24 0503  NA 140 139 138 141 141  K 3.6 3.8 3.8 3.5 3.8  CL 100 102 101 101 100  CO2 27 29 29 31  33*  GLUCOSE 118* 129* 108* 110* 110*  BUN 10 10 12 16 14   CREATININE 0.64 0.56 0.67 0.79 0.67  CALCIUM  8.9 9.0 8.7* 8.9 8.9  MG  --   --  2.2 2.2 2.2  PHOS  --   --  3.9 5.1* 4.2    GFR: Estimated Creatinine Clearance: 98.6 mL/min (by C-G formula based on SCr of 0.67 mg/dL).  Liver Function Tests: Recent Labs  Lab 12/17/24 0532 12/18/24 0520 12/19/24 0503  AST 16 16 18   ALT 11 12 15   ALKPHOS 91 99 98  BILITOT 0.3 0.2 0.3  PROT 6.1* 6.5 6.2*  ALBUMIN  3.2* 3.7 3.4*    CBG: No results for input(s): GLUCAP in the last 168 hours.   Recent Results (from the past 240 hours)  Resp panel by  RT-PCR (RSV, Flu Pamela Pamela [name]&B, Covid) Anterior Nasal Swab     Status: Abnormal   Collection Time: 12/14/24 12:19 PM   Specimen: Anterior Nasal Swab  Result Value Ref Range Status   SARS Coronavirus 2 by RT PCR POSITIVE (Pamela Pamela [name]) NEGATIVE Final    Comment: (NOTE) SARS-CoV-2 target nucleic acids are DETECTED.  The SARS-CoV-2 RNA is generally detectable in upper respiratory specimens during the acute phase of infection. Positive results are indicative of the presence of the identified virus, but [name] not rule out bacterial infection or co-infection with other pathogens not detected by the test. Clinical correlation with patient history and other diagnostic information is necessary to determine patient infection status. The expected result is Negative.  Fact Sheet for Patients: bloggercourse.com  Fact Sheet for Healthcare Providers: seriousbroker.it  This test is not yet approved or cleared by the [place]  FDA and  has been authorized for detection and/or diagnosis of SARS-CoV-2 by FDA under an Emergency Use Authorization (EUA).  This EUA will remain in effect (meaning this test can be used) for the duration of  the COVID-19 declaration under Section 564(b)(1) of the Pamela Pamela [name] ct, 21 U.S.C. section 360bbb-3(b)(1), unless the authorization is terminated or revoked sooner.     Influenza Pamela Pamela [name] by PCR NEGATIVE NEGATIVE Final   Influenza B by PCR NEGATIVE NEGATIVE Final    Comment: (NOTE) The Xpert Xpress SARS-CoV-2/FLU/RSV plus assay is intended as an aid in the diagnosis of influenza from Nasopharyngeal swab specimens and should not be used as Pamela Pamela [name] sole basis for treatment. Nasal washings and aspirates are unacceptable for Xpert Xpress SARS-CoV-2/FLU/RSV testing.  Fact Sheet for Patients: bloggercourse.com  Fact Sheet for Healthcare Providers: seriousbroker.it  This test is not yet approved or cleared by the  [place]  FDA and has been authorized for detection and/or diagnosis of SARS-CoV-2 by FDA under an Emergency Use Authorization (EUA). This EUA will remain in effect (meaning this test can be used) for the duration of the COVID-19 declaration under Section 564(b)(1) of the Act, 21 U.S.C. section 360bbb-3(b)(1), unless the authorization is terminated or revoked.     Resp Syncytial Virus by PCR NEGATIVE NEGATIVE Final    Comment: (NOTE) Fact Sheet for Patients: bloggercourse.com  Fact Sheet for Healthcare Providers: seriousbroker.it  This test is not yet approved or cleared by the [place]  FDA and has been authorized for detection and/or diagnosis of SARS-CoV-2 by FDA under an Emergency Use Authorization (EUA). This EUA will remain in effect (meaning this test can be used) for the duration of the COVID-19 declaration under Section 564(b)(1) of the Act, 21 U.S.C. section 360bbb-3(b)(1), unless the authorization is terminated or revoked.  Performed at [facility], [address] [place], [place]  [address]   MRSA Next Gen by PCR, Nasal     Status: None   Collection Time: 12/14/24  5:14 PM   Specimen: Nasal Mucosa; Nasal Swab  Result Value Ref Range Status   MRSA by PCR Next Gen NOT DETECTED NOT DETECTED Final    Comment: (NOTE) The GeneXpert MRSA Assay (FDA approved for NASAL specimens only), is one component of Kumiko Fishman comprehensive MRSA colonization surveillance program. It is not intended to diagnose MRSA infection nor to guide or monitor treatment for MRSA infections. Test performance is not FDA approved in patients less than 67 years old. Performed at [facility], [address] [place], [place] [address]          Radiology Studies: CT Angio Chest Pulmonary Embolism (PE) W or WO Contrast Result Date: 12/19/2024 EXAM: CTA of the Chest with contrast for PE 12/19/2024 12:41:45 PM TECHNIQUE: CTA of  the chest was performed without and with the administration of 75 mL of iohexol  (OMNIPAQUE ) 350 MG/ML injection. Multiplanar reformatted images are provided for review. MIP images are provided for review. Automated exposure control, iterative reconstruction, and/or weight based adjustment of the mA/kV was utilized to reduce the radiation dose to as low as reasonably achievable. COMPARISON: 08/17/2024 CLINICAL HISTORY: Shortness of breath, worsening in setting of COVID-19 infection. FINDINGS: PULMONARY ARTERIES: Pulmonary arteries are adequately opacified for evaluation. Mild dilatation of central pulmonary arteries. No pulmonary embolism. MEDIASTINUM: The heart and pericardium demonstrate no acute abnormality. 4cm mild dilatation of the proximal ascending thoracic aorta. Arch measures 3.5 cm diameter, descending segment normal in caliber, mildly tortuous with scattered partially calcified plaque. LYMPH NODES: No mediastinal, hilar or axillary lymphadenopathy. LUNGS AND PLEURA: Perifissural nodule adjacent to the minor fissure stable since previous. Minimal linear scarring or subsegmental atelectasis in the medial right lower lobe and lateral left lower lobe, stable. Mild centrilobular emphysema as before. Prominent motion degrades some of the images through the lung bases. No pleural effusion or pneumothorax. UPPER ABDOMEN: 7 mm partially calcified gallstone in the dependent aspect of the nondilated gallbladder. Small hiatal hernia. Retroaortic left renal vein, anatomic variant. SOFT TISSUES AND BONES: Bilateral breast implants, partially calcified. Vertebral endplate spurring at multiple levels in the lower thoracic spine. IMPRESSION: 1. No pulmonary embolism. 2. Mild centrilobular emphysema, stable, an independent risk factor for lung cancer, so recommend consideration for evaluation for Ilka Lovick low-dose CT lung cancer screening program. 3. Mild dilatation of the central pulmonary arteries and proximal ascending  thoracic aorta. Electronically signed by: Dayne Hassell MD 12/19/2024 01:15 PM EST RP Workstation: [id number]        Scheduled Meds:  albuterol   2.5 mg Nebulization TID   amitriptyline   10 mg Oral QHS   aspirin  EC  81 mg Oral Daily   atorvastatin   20 mg Oral QHS   budesonide -glycopyrrolate -formoterol   2 puff Inhalation BID   busPIRone   20 mg Oral TID   enoxaparin  (LOVENOX ) injection  40 mg Subcutaneous Q24H   levothyroxine   150 mcg Oral QAC breakfast   loratadine   10 mg Oral QPM   melatonin  10 mg Oral QHS   multivitamin with minerals  1 tablet Oral Daily   predniSONE   50 mg Oral Q breakfast   sertraline   150 mg Oral Daily   Continuous Infusions:   LOS: 5 days    Time spent: over 30 min     Meliton Monte, MD [facility]   To contact the attending provider between 7A-7P or the covering provider during after  hours 7P-7A, please log into the web site [email or web address] and access using universal [place] password for that web site. If you [name] not have the password, please call the hospital operator.  12/19/2024, 1:22 PM    "

## 2024-12-20 DIAGNOSIS — J9601 Acute respiratory failure with hypoxia: Secondary | ICD-10-CM | POA: Diagnosis not present

## 2024-12-20 LAB — COMPREHENSIVE METABOLIC PANEL WITH GFR
ALT: 12 U/L (ref 0–44)
AST: 17 U/L (ref 15–41)
Albumin: 3.4 g/dL — ABNORMAL LOW (ref 3.5–5.0)
Alkaline Phosphatase: 90 U/L (ref 38–126)
Anion gap: 5 (ref 5–15)
BUN: 11 mg/dL (ref 8–23)
CO2: 34 mmol/L — ABNORMAL HIGH (ref 22–32)
Calcium: 8.7 mg/dL — ABNORMAL LOW (ref 8.9–10.3)
Chloride: 100 mmol/L (ref 98–111)
Creatinine, Ser: 0.64 mg/dL (ref 0.44–1.00)
GFR, Estimated: >60 mL/min
Glucose, Bld: 108 mg/dL — ABNORMAL HIGH (ref 70–99)
Potassium: 3.6 mmol/L (ref 3.5–5.1)
Sodium: 139 mmol/L (ref 135–145)
Total Bilirubin: 0.3 mg/dL (ref 0.0–1.2)
Total Protein: 6 g/dL — ABNORMAL LOW (ref 6.5–8.1)

## 2024-12-20 LAB — CBC WITH DIFFERENTIAL/PLATELET
Abs Immature Granulocytes: 0.09 10*3/uL — ABNORMAL HIGH (ref 0.00–0.07)
Basophils Absolute: 0.1 10*3/uL (ref 0.0–0.1)
Basophils Relative: 1 %
Eosinophils Absolute: 0.1 10*3/uL (ref 0.0–0.5)
Eosinophils Relative: 1 %
HCT: 39 % (ref 36.0–46.0)
Hemoglobin: 12.4 g/dL (ref 12.0–15.0)
Immature Granulocytes: 1 %
Lymphocytes Relative: 35 %
Lymphs Abs: 3.4 10*3/uL (ref 0.7–4.0)
MCH: 27.3 pg (ref 26.0–34.0)
MCHC: 31.8 g/dL (ref 30.0–36.0)
MCV: 85.7 fL (ref 80.0–100.0)
Monocytes Absolute: 0.7 10*3/uL (ref 0.1–1.0)
Monocytes Relative: 8 %
Neutro Abs: 5.3 10*3/uL (ref 1.7–7.7)
Neutrophils Relative %: 54 %
Platelets: 364 10*3/uL (ref 150–400)
RBC: 4.55 MIL/uL (ref 3.87–5.11)
RDW: 14.3 % (ref 11.5–15.5)
WBC: 9.7 10*3/uL (ref 4.0–10.5)
nRBC: 0 % (ref 0.0–0.2)

## 2024-12-20 LAB — PHOSPHORUS: Phosphorus: 4 mg/dL (ref 2.5–4.6)

## 2024-12-20 LAB — MAGNESIUM: Magnesium: 2.2 mg/dL (ref 1.7–2.4)

## 2024-12-20 MED ORDER — PREDNISONE 5 MG PO TABS: 10.0000 mg | ORAL_TABLET | Freq: Every day | ORAL | Status: DC

## 2024-12-20 MED ORDER — PREDNISONE 20 MG PO TABS: 20.0000 mg | ORAL_TABLET | Freq: Every day | ORAL | Status: DC

## 2024-12-20 MED ORDER — PREDNISONE 5 MG PO TABS
30.0000 mg | ORAL_TABLET | Freq: Every day | ORAL | Status: DC
  Administered 2024-12-24: 30 mg via ORAL
  Filled 2024-12-20 (×2): qty 2

## 2024-12-20 MED ORDER — PREDNISONE 20 MG PO TABS
40.0000 mg | ORAL_TABLET | Freq: Every day | ORAL | Status: AC
  Administered 2024-12-21 – 2024-12-23 (×3): 40 mg via ORAL
  Filled 2024-12-20 (×3): qty 2

## 2024-12-20 MED ORDER — METHYLPREDNISOLONE SODIUM SUCC 40 MG IJ SOLR
40.0000 mg | Freq: Once | INTRAMUSCULAR | Status: AC
  Administered 2024-12-20: 40 mg via INTRAVENOUS
  Filled 2024-12-20: qty 1

## 2024-12-20 NOTE — Progress Notes (Signed)
 " PROGRESS NOTE    Pamela [name]  FMW:[id number] DOB: [birth year] DOA: 12/14/2024 PCP: Frann Mabel Mt, DO  Chief Complaint  Patient presents with   Shortness of Breath    Brief Narrative:   [name] is Pamela [name] 64 year old female with history of severe COPD, anxiety, depression, hyperlipidemia, hypothyroid, GERD admitted with COPD exacerbation in setting of COVID 19 infection.  Assessment & Plan:   Principal Problem:   Acute hypoxic respiratory failure (HCC) Active Problems:   Chronic obstructive pulmonary disease (HCC)   COVID-19 virus infection   Hypothyroidism   Other hyperlipidemia   Morbid obesity (HCC)   Anxiety  Acute hypoxic respiratory failure (HCC) COVID-19 infection  COPD exacerbation Continue steroids, scheduled, and prn nebs - will give extra dose of IV steroids and plan for prolonged steroid taper given her persistent symptoms CXR 1/22 without active disease Reports worse SOB today, CT PE protocol without PE Anxiety is playing Pernell Lenoir role, continue reassurance and supportive care Wean O2 as tolerated - she remains on 3 L - will try to wean this   Chronic obstructive pulmonary disease (HCC) Continue home Breztri  Patient reports she has Jamont Mellin nebulizer machine and nebulizer solution at home Continue low dose CT cancer screening outpatient   COVID-19 virus infection Airborne precautions   Hypothyroidism Home levothyroxine  150 mcg daily before breakfast resumed   Other hyperlipidemia Home atorvastatin  20 mg nightly resumed   Anxiety Home sertraline  150 mg daily, buspirone  20 mg 3 times daily, amitriptyline  100 mg nightly were resumed  Class III Obesity Body mass index is 52.11 kg/m.     DVT prophylaxis: lovenox  Code Status: full Family Communication: none Disposition:   Status is: Inpatient Remains inpatient appropriate because: need for continued inpatient care   Consultants:  none  Procedures:  none  Antimicrobials:   Anti-infectives (From admission, onward)    None       Subjective:  No new complaints Feeling about the same, continued SOB  Objective: Vitals:   12/19/24 2040 12/19/24 2230 12/20/24 0420 12/20/24 0829  BP: 130/81  (!) 140/101   Pulse: 92  63 62  Resp: 16  18 18   Temp: 98.1 F (36.7 C)  97.9 F (36.6 C)   TempSrc: Oral  Oral   SpO2: 97% 99% 98%   Weight:      Height:        Intake/Output Summary (Last 24 hours) at 12/20/2024 1205 Last data filed at 12/19/2024 2100 Gross per 24 hour  Intake 480 ml  Output --  Net 480 ml   Filed Weights   12/15/24 0542  Weight: (!) 137.7 kg    Examination:  General: No acute distress. Cardiovascular: RRR Lungs: diminished, expiratory wheezing - transmitted upper airway sounds Neurological: Alert and oriented 3. Moves all extremities 4 with equal strength. Cranial nerves II through XII grossly intact. Extremities: No clubbing or cyanosis. No edema.  Data Reviewed: I have personally reviewed following labs and imaging studies  CBC: Recent Labs  Lab 12/15/24 0513 12/17/24 0532 12/18/24 0520 12/19/24 0503 12/20/24 0528  WBC 5.9 6.8 8.1 9.5 9.7  NEUTROABS  --  4.0 4.3 5.5 5.3  HGB 13.2 13.2 13.7 13.1 12.4  HCT 41.2 41.0 42.7 42.5 39.0  MCV 85.8 85.4 86.8 88.0 85.7  PLT 323 305 335 346 364    Basic Metabolic Panel: Recent Labs  Lab 12/15/24 0513 12/17/24 0532 12/18/24 0520 12/19/24 0503 12/20/24 0528  NA 139 138 141 141 139  K 3.8 3.8  3.5 3.8 3.6  CL 102 101 101 100 100  CO2 29 29 31  33* 34*  GLUCOSE 129* 108* 110* 110* 108*  BUN 10 12 16 14 11   CREATININE 0.56 0.67 0.79 0.67 0.64  CALCIUM  9.0 8.7* 8.9 8.9 8.7*  MG  --  2.2 2.2 2.2 2.2  PHOS  --  3.9 5.1* 4.2 4.0    GFR: Estimated Creatinine Clearance: 98.6 mL/min (by C-G formula based on SCr of 0.64 mg/dL).  Liver Function Tests: Recent Labs  Lab 12/17/24 0532 12/18/24 0520 12/19/24 0503 12/20/24 0528  AST 16 16 18 17   ALT 11 12 15 12    ALKPHOS 91 99 98 90  BILITOT 0.3 0.2 0.3 0.3  PROT 6.1* 6.5 6.2* 6.0*  ALBUMIN  3.2* 3.7 3.4* 3.4*    CBG: No results for input(s): GLUCAP in the last 168 hours.   Recent Results (from the past 240 hours)  Resp panel by RT-PCR (RSV, Flu Pinkie Manger&B, Covid) Anterior Nasal Swab     Status: Abnormal   Collection Time: 12/14/24 12:19 PM   Specimen: Anterior Nasal Swab  Result Value Ref Range Status   SARS Coronavirus 2 by RT PCR POSITIVE (Yaire Kreher) NEGATIVE Final    Comment: (NOTE) SARS-CoV-2 target nucleic acids are DETECTED.  The SARS-CoV-2 RNA is generally detectable in upper respiratory specimens during the acute phase of infection. Positive results are indicative of the presence of the identified virus, but do not rule out bacterial infection or co-infection with other pathogens not detected by the test. Clinical correlation with patient history and other diagnostic information is necessary to determine patient infection status. The expected result is Negative.  Fact Sheet for Patients: bloggercourse.com  Fact Sheet for Healthcare Providers: seriousbroker.it  This test is not yet approved or cleared by the [place]  FDA and  has been authorized for detection and/or diagnosis of SARS-CoV-2 by FDA under an Emergency Use Authorization (EUA).  This EUA will remain in effect (meaning this test can be used) for the duration of  the COVID-19 declaration under Section 564(b)(1) of the Yolunda Kloos ct, 21 U.S.C. section 360bbb-3(b)(1), unless the authorization is terminated or revoked sooner.     Influenza Mohannad Olivero by PCR NEGATIVE NEGATIVE Final   Influenza B by PCR NEGATIVE NEGATIVE Final    Comment: (NOTE) The Xpert Xpress SARS-CoV-2/FLU/RSV plus assay is intended as an aid in the diagnosis of influenza from Nasopharyngeal swab specimens and should not be used as Sourish Allender sole basis for treatment. Nasal washings and aspirates are unacceptable for Xpert Xpress  SARS-CoV-2/FLU/RSV testing.  Fact Sheet for Patients: bloggercourse.com  Fact Sheet for Healthcare Providers: seriousbroker.it  This test is not yet approved or cleared by the [place]  FDA and has been authorized for detection and/or diagnosis of SARS-CoV-2 by FDA under an Emergency Use Authorization (EUA). This EUA will remain in effect (meaning this test can be used) for the duration of the COVID-19 declaration under Section 564(b)(1) of the Act, 21 U.S.C. section 360bbb-3(b)(1), unless the authorization is terminated or revoked.     Resp Syncytial Virus by PCR NEGATIVE NEGATIVE Final    Comment: (NOTE) Fact Sheet for Patients: bloggercourse.com  Fact Sheet for Healthcare Providers: seriousbroker.it  This test is not yet approved or cleared by the [place]  FDA and has been authorized for detection and/or diagnosis of SARS-CoV-2 by FDA under an Emergency Use Authorization (EUA). This EUA will remain in effect (meaning this test can be used) for the duration of the COVID-19 declaration under Section  564(b)(1) of the Act, 21 U.S.C. section 360bbb-3(b)(1), unless the authorization is terminated or revoked.  Performed at [facility], [address] [place], [place] [address]   MRSA Next Gen by PCR, Nasal     Status: None   Collection Time: 12/14/24  5:14 PM   Specimen: Nasal Mucosa; Nasal Swab  Result Value Ref Range Status   MRSA by PCR Next Gen NOT DETECTED NOT DETECTED Final    Comment: (NOTE) The GeneXpert MRSA Assay (FDA approved for NASAL specimens only), is one component of Esequiel Kleinfelter comprehensive MRSA colonization surveillance program. It is not intended to diagnose MRSA infection nor to guide or monitor treatment for MRSA infections. Test performance is not FDA approved in patients less than 75 years old. Performed at [facility], [address], [place], [place] [address]          Radiology Studies: CT Angio Chest Pulmonary Embolism (PE) W or WO Contrast Result Date: 12/19/2024 EXAM: CTA of the Chest with contrast for PE 12/19/2024 12:41:45 PM TECHNIQUE: CTA of the chest was performed without and with the administration of 75 mL of iohexol  (OMNIPAQUE ) 350 MG/ML injection. Multiplanar reformatted images are provided for review. MIP images are provided for review. Automated exposure control, iterative reconstruction, and/or weight based adjustment of the mA/kV was utilized to reduce the radiation dose to as low as reasonably achievable. COMPARISON: 08/17/2024 CLINICAL HISTORY: Shortness of breath, worsening in setting of COVID-19 infection. FINDINGS: PULMONARY ARTERIES: Pulmonary arteries are adequately opacified for evaluation. Mild dilatation of central pulmonary arteries. No pulmonary embolism. MEDIASTINUM: The heart and pericardium demonstrate no acute abnormality. 4cm mild dilatation of the proximal ascending thoracic aorta. Arch measures 3.5 cm diameter, descending segment normal in caliber, mildly tortuous with scattered partially calcified plaque. LYMPH NODES: No mediastinal, hilar or axillary lymphadenopathy. LUNGS AND PLEURA: Perifissural nodule adjacent to the minor fissure stable since previous. Minimal linear scarring or subsegmental atelectasis in the medial right lower lobe and lateral left lower lobe, stable. Mild centrilobular emphysema as before. Prominent motion degrades some of the images through the lung bases. No pleural effusion or pneumothorax. UPPER ABDOMEN: 7 mm partially calcified gallstone in the dependent aspect of the nondilated gallbladder. Small hiatal hernia. Retroaortic left renal vein, anatomic variant. SOFT TISSUES AND BONES: Bilateral breast implants, partially calcified. Vertebral endplate spurring at multiple levels in the lower thoracic spine. IMPRESSION: 1. No pulmonary embolism. 2. Mild centrilobular  emphysema, stable, an independent risk factor for lung cancer, so recommend consideration for evaluation for Lisbeth Puller low-dose CT lung cancer screening program. 3. Mild dilatation of the central pulmonary arteries and proximal ascending thoracic aorta. Electronically signed by: Dayne Hassell MD 12/19/2024 01:15 PM EST RP Workstation: [id number]        Scheduled Meds:  albuterol   2.5 mg Nebulization TID   amitriptyline   10 mg Oral QHS   aspirin  EC  81 mg Oral Daily   atorvastatin   20 mg Oral QHS   budesonide -glycopyrrolate -formoterol   2 puff Inhalation BID   busPIRone   20 mg Oral TID   enoxaparin  (LOVENOX ) injection  40 mg Subcutaneous Q24H   levothyroxine   150 mcg Oral QAC breakfast   loratadine   10 mg Oral QPM   melatonin  10 mg Oral QHS   multivitamin with minerals  1 tablet Oral Daily   sertraline   150 mg Oral Daily   Continuous Infusions:   LOS: 6 days    Time spent: over 30 min  Meliton Monte, MD [facility]   To contact the attending provider between 7A-7P or the covering provider during after hours 7P-7A, please log into the web site [email or web address] and access using universal [place] password for that web site. If you do not have the password, please call the hospital operator.  12/20/2024, 12:05 PM    "

## 2024-12-21 DIAGNOSIS — J9601 Acute respiratory failure with hypoxia: Secondary | ICD-10-CM | POA: Diagnosis not present

## 2024-12-21 MED ORDER — FUROSEMIDE 10 MG/ML IJ SOLN
40.0000 mg | Freq: Once | INTRAMUSCULAR | Status: AC
  Administered 2024-12-21: 40 mg via INTRAVENOUS
  Filled 2024-12-21: qty 4

## 2024-12-21 MED ORDER — POTASSIUM CHLORIDE CRYS ER 20 MEQ PO TBCR
40.0000 meq | EXTENDED_RELEASE_TABLET | Freq: Once | ORAL | Status: AC
  Administered 2024-12-21: 40 meq via ORAL
  Filled 2024-12-21: qty 2

## 2024-12-21 NOTE — TOC Progression Note (Signed)
 Transition of Care [facility]) - Progression Note    Patient Details  Name: Pamela [name] MRN: [id number] Date of Birth: [birth year]  Transition of Care [facility]) CM/SW Contact  Samiyah Stupka, Nathanel, RN Phone Number: 12/21/2024, 9:25 AM  Clinical Narrative: Noted Patient not homebound-works FT. May need to be assess for home indep vs otpt PT.      Expected Discharge Plan: Home/Self Care Barriers to Discharge: Continued Medical Work up               Expected Discharge Plan and Services   Discharge Planning Services: CM Consult   Living arrangements for the past 2 months: Single Family Home                                       Social Drivers of Health (SDOH) Interventions SDOH Screenings   Food Insecurity: Unknown (12/16/2024)  Recent Concern: Food Insecurity - Food Insecurity Present (12/14/2024)  Housing: Low Risk (12/14/2024)  Transportation Needs: No Transportation Needs (12/14/2024)  Utilities: Not At Risk (12/14/2024)  Depression (PHQ2-9): High Risk (11/04/2024)  Financial Resource Strain: Medium Risk (03/23/2024)  Physical Activity: Insufficiently Active (03/23/2024)  Social Connections: Moderately Integrated (03/23/2024)  Stress: Stress Concern Present (03/23/2024)  Tobacco Use: Medium Risk (12/14/2024)    Readmission Risk Interventions     No data to display

## 2024-12-21 NOTE — Progress Notes (Addendum)
 Physical Therapy Treatment Patient Details Name: Pamela [name] MRN: [id number] DOB: 1961/11/22 Today's Date: 12/21/2024   History of Present Illness 64 yo female presents to therapy following hospital admission on 12/14/2024 due to SOB. Pt found to have acute respiratory failure secondary to COVID and COPD exacerbation.  Pt PMH includes but is not limited to: anemia, anxiety, asthma, COPD, depression, GERD, HLD, hypothyroidism, LBP, and OSA on CPAP.    PT Comments  Pt ambulated 120' without an assistive device, 3/4 dyspnea, SpO2 86% on room air walking, HR 130. VCs for pursed lip breathing. Instructed pt in energy conservation techniques (slowing pace of activity, taking rest breaks, sit for showering) and in seated BUE/LE exercises.     If plan is discharge home, recommend the following:     Can travel by private vehicle        Equipment Recommendations  None recommended by PT    Recommendations for Other Services  OT -for energy conservation     Precautions / Restrictions Precautions Precautions: Fall Recall of Precautions/Restrictions: Intact Precaution/Restrictions Comments: monitor O2 Restrictions Weight Bearing Restrictions Per Provider Order: No     Mobility  Bed Mobility               General bed mobility comments: seated in recliner when PT arrived    Transfers Overall transfer level: Independent Equipment used: None                    Ambulation/Gait Ambulation/Gait assistance: Supervision Gait Distance (Feet): 120 Feet Assistive device: None Gait Pattern/deviations: Step-through pattern Gait velocity: cues for decreasing cadence     General Gait Details: steady, no loss of balance, 3/4 dyspnea, VCs for pursed lip breathing, SpO2 86% on room air walking, HR 130 walking;   Stairs             Wheelchair Mobility     Tilt Bed    Modified Rankin (Stroke Patients Only)       Balance Overall balance assessment: No apparent  balance deficits (not formally assessed)                                          Communication Communication Communication: No apparent difficulties  Cognition Arousal: Alert Behavior During Therapy: WFL for tasks assessed/performed   PT - Cognitive impairments: No apparent impairments                         Following commands: Intact      Cueing    Exercises General Exercises - Upper Extremity Shoulder Flexion: AROM, Both, 10 reps, Seated General Exercises - Lower Extremity Long Arc Quad: AROM, Both, 15 reps, Seated Hip Flexion/Marching: AROM, Both, 15 reps, Seated    General Comments        Pertinent Vitals/Pain Pain Assessment Pain Assessment: No/denies pain    Home Living                          Prior Function            PT Goals (current goals can now be found in the care plan section) Acute Rehab PT Goals Patient Stated Goal: to be able to feel like I breathe again; return to work and swimming PT Goal Formulation: With patient Time For Goal Achievement: 01/01/25 Potential to Achieve  Goals: Good Progress towards PT goals: Progressing toward goals    Frequency    Min 3X/week      PT Plan      Co-evaluation              AM-PAC PT 6 Clicks Mobility   Outcome Measure  Help needed turning from your back to your side while in a flat bed without using bedrails?: None Help needed moving from lying on your back to sitting on the side of a flat bed without using bedrails?: None Help needed moving to and from a bed to a chair (including a wheelchair)?: None Help needed standing up from a chair using your arms (e.g., wheelchair or bedside chair)?: None Help needed to walk in hospital room?: None Help needed climbing 3-5 steps with a railing? : None 6 Click Score: 24    End of Session Equipment Utilized During Treatment: Gait belt Activity Tolerance: Patient limited by fatigue Patient left: in chair;with  call bell/phone within reach Nurse Communication: Mobility status PT Visit Diagnosis: Difficulty in walking, not elsewhere classified (R26.2)     Time: 1323-1350 PT Time Calculation (min) (ACUTE ONLY): 27 min  Charges:    $Gait Training: 8-22 mins $Therapeutic Exercise: 8-22 mins PT General Charges $$ ACUTE PT VISIT: 1 Visit                    Pamela [name] PT 12/21/2024  Acute Rehabilitation Services  Office [phone]

## 2024-12-21 NOTE — TOC Transition Note (Signed)
 Transition of Care [facility]) - Discharge Note   Patient Details  Name: Pamela [name] MRN: [id number] Date of Birth: 09-Mar-1961  [facility]) CM/SW Contact:  Bascom Service, RN Phone Number: 12/21/2024, 2:23 PM   Clinical Narrative:   delivery to rm prior d/c. Qualifies for home 02-[employer] rep Jermaine. No further CM needs.   Final next level of care: Home/Self Care Barriers to Discharge: No Barriers Identified   Patient Goals and CMS Choice Patient states their goals for this hospitalization and ongoing recovery are:: Home CMS Medicare.gov Compare Post Acute Care list provided to:: Patient Choice offered to / list presented to : Patient [place] ownership interest in [facility].provided to:: Patient    Discharge Placement                       Discharge Plan and Services Additional resources added to the After Visit Summary for     Discharge Planning Services: CM Consult Post Acute Care Choice: Durable Medical Equipment          DME Arranged: Oxygen DME Agency: [employer] Date DME Agency Contacted: 12/21/24 Time DME Agency Contacted: [phone] Representative spoke with at DME Agency: [place]            Social Drivers of Health (SDOH) Interventions SDOH Screenings   Food Insecurity: Unknown (12/16/2024)  Recent Concern: Food Insecurity - Food Insecurity Present (12/14/2024)  Housing: Low Risk (12/14/2024)  Transportation Needs: No Transportation Needs (12/14/2024)  Utilities: Not At Risk (12/14/2024)  Depression (PHQ2-9): High Risk (11/04/2024)  Financial Resource Strain: Medium Risk (03/23/2024)  Physical Activity: Insufficiently Active (03/23/2024)  Social Connections: Moderately Integrated (03/23/2024)  Stress: Stress Concern Present (03/23/2024)  Tobacco Use: Medium Risk (12/14/2024)     Readmission Risk Interventions     No data to display

## 2024-12-21 NOTE — Progress Notes (Signed)
" °   12/21/24 0005  BiPAP/CPAP/SIPAP  $ Non-Invasive Home Ventilator  Subsequent  BiPAP/CPAP/SIPAP Pt Type Adult  BiPAP/CPAP/SIPAP Resmed  Mask Type Nasal mask  Dentures removed? Not applicable  Mask Size Small  Flow Rate 1 lpm  Patient Home Machine No  Patient Home Mask No  Patient Home Tubing No  Auto Titrate Yes  Minimum cmH2O 5 cmH2O  Maximum cmH2O 12 cmH2O  Device Plugged into RED Power Outlet Yes    "

## 2024-12-21 NOTE — Evaluation (Signed)
 Occupational Therapy Evaluation Patient Details Name: Pamela [name] MRN: [id number] DOB: 1961/08/10 Today's Date: 12/21/2024   History of Present Illness   64 yo female presents to therapy following hospital admission on 12/14/2024 due to SOB. Pt found to have acute respiratory failure secondary to COVID and COPD exacerbation.  Pt PMH includes but is not limited to: anemia, anxiety, asthma, COPD, depression, GERD, HLD, hypothyroidism, LBP, and OSA on CPAP.     Clinical Impressions Pt lives alone and is modified independent in ADLs and IADLs. 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 Pt presents with decreased activity tolerance. Educated in detail in energy conservation strategies, breathing techniques and provided written handouts to reinforce. Pt receptive of all information. Reports she has been considering lifestyle changes to accommodate her COPD including hiring a house cleaning service. She recently bought at shower seat and hand held shower head. Pt is able to perform figure 4 to reach her feet, recommended long handled bath sponge, reacher, slip on shoes without ties. Pt verbalized and/or demonstrated understanding of all education. No further OT needs.      If plan is discharge home, recommend the following:   Assistance with cooking/housework     Functional Status Assessment   Patient has had a recent decline in their functional status and demonstrates the ability to make significant improvements in function in a reasonable and predictable amount of time.     Equipment Recommendations   None recommended by OT     Recommendations for Other Services         Precautions/Restrictions   Precautions Precautions: Other (comment) Recall of Precautions/Restrictions: Intact Precaution/Restrictions Comments: monitor O2 Restrictions Weight Bearing Restrictions Per Provider Order: No     Mobility Bed Mobility Overal bed mobility: Modified  Independent                  Transfers Overall transfer level: Independent Equipment used: None                      Balance Overall balance assessment: No apparent balance deficits (not formally assessed)                                         ADL either performed or assessed with clinical judgement   ADL Overall ADL's : Modified independent                                       General ADL Comments: Educated pt, at length, in energy conservation strategies during ADLs, IADLs, pursed lip breathing and use of O2 in shower.     Vision Baseline Vision/History: 1 Wears glasses Ability to See in Adequate Light: 0 Adequate Patient Visual Report: No change from baseline       Perception         Praxis         Pertinent Vitals/Pain Pain Assessment Pain Assessment: No/denies pain     Extremity/Trunk Assessment Upper Extremity Assessment Upper Extremity Assessment: Overall WFL for tasks assessed   Lower Extremity Assessment Lower Extremity Assessment: Defer to PT evaluation   Cervical / Trunk Assessment Cervical / Trunk Assessment: Normal   Communication Communication Communication: No apparent difficulties   Cognition Arousal: Alert Behavior During Therapy: [facility] for tasks  assessed/performed Cognition: No apparent impairments             OT - Cognition Comments: pt very receptive to all education                 Following commands: Intact       Cueing  General Comments   Cueing Techniques: Verbal cues      Exercises     Shoulder Instructions      Home Living Family/patient expects to be discharged to:: Private residence Living Arrangements: Alone Available Help at Discharge: Family;Available PRN/intermittently Type of Home: Apartment Home Access: Level entry     Home Layout: One level     Bathroom Shower/Tub: Chief Strategy Officer: Standard     Home Equipment:  Shower seat;Grab bars - tub/shower;Hand held shower head          Prior Functioning/Environment Prior Level of Function : Independent/Modified Independent;Driving;Working/employed               ADLs Comments: recently bought a shower seat and hand held shower head    OT Problem List:     OT Treatment/Interventions:        OT Goals(Current goals can be found in the care plan section)       OT Frequency:       Co-evaluation              AM-PAC OT 6 Clicks Daily Activity     Outcome Measure Help from another person eating meals?: None Help from another person taking care of personal grooming?: None Help from another person toileting, which includes using toliet, bedpan, or urinal?: None Help from another person bathing (including washing, rinsing, drying)?: None Help from another person to put on and taking off regular upper body clothing?: None Help from another person to put on and taking off regular lower body clothing?: None 6 Click Score: 24   End of Session Equipment Utilized During Treatment: Oxygen  Activity Tolerance: Patient tolerated treatment well Patient left: in bed;with call bell/phone within reach  OT Visit Diagnosis: Other (comment) (decreased activity tolerance)                Time: 8494-8460 OT Time Calculation (min): 34 min Charges:  OT General Charges $OT Visit: 1 Visit OT Evaluation $OT Eval Low Complexity: 1 Low OT Treatments $Self Care/Home Management : 8-22 mins  Mliss HERO, OTR/L Acute Rehabilitation Services Office: [phone]   Kennth Mliss Helling 12/21/2024, 3:47 PM

## 2024-12-21 NOTE — Progress Notes (Addendum)
 " PROGRESS NOTE    Pamela [name]  FMW:[id number] DOB: 1961-01-13 DOA: 12/14/2024 PCP: Frann Mabel Mt, DO  Chief Complaint  Patient presents with   Shortness of Breath    Brief Narrative:   Pamela [name] is Pamela [name] 64 year old female with history of severe COPD, anxiety, depression, hyperlipidemia, hypothyroid, GERD admitted with COPD exacerbation in setting of COVID 19 infection.  Assessment & Plan:   Principal Problem:   Acute hypoxic respiratory failure (HCC) Active Problems:   Chronic obstructive pulmonary disease (HCC)   COVID-19 virus infection   Hypothyroidism   Other hyperlipidemia   Morbid obesity (HCC)   Anxiety  Acute hypoxic respiratory failure (HCC) COVID-19 infection  COPD exacerbation Continue steroids, scheduled, and prn nebs  CT PE protocol with centrilobular emphysema, no PE on 1/24 She notes continued, unchanging shortness of breath - for now, continuing supportive care with steroids and nebs.  Trial lasix  to see if this helps at all.  Anxiety is playing Pamela [name] role, continue reassurance and supportive care - discussed possibility that she may not get back to her previous baseline.  May need oxygen at discharge. Today on 1 L, RA at times (hypoxia noted today while on RA) Will ask pulm to see tmrw if not improving.  Chronic obstructive pulmonary disease (HCC) Continue home Breztri  Patient reports she has Pamela [name] nebulizer machine and nebulizer solution at home Continue low dose CT cancer screening outpatient   COVID-19 virus infection Airborne precautions   Hypothyroidism Home levothyroxine  150 mcg daily before breakfast resumed   Other hyperlipidemia Home atorvastatin  20 mg nightly resumed   Anxiety Home sertraline  150 mg daily, buspirone  20 mg 3 times daily, amitriptyline  100 mg nightly were resumed  Class III Obesity Body mass index is 52.11 kg/m.     DVT prophylaxis: lovenox  Code Status: full Family Communication: none Disposition:    Status is: Inpatient Remains inpatient appropriate because: need for continued inpatient care   Consultants:  none  Procedures:  none  Antimicrobials:  Anti-infectives (From admission, onward)    None       Subjective:  Feels the same Really anxious, asks if she's ever going to get better  Objective: Vitals:   12/21/24 0921 12/21/24 1354 12/21/24 1446 12/21/24 1601  BP:    (!) 142/96  Pulse:    98  Resp:    16  Temp:    97.8 F (36.6 C)  TempSrc:    Oral  SpO2: 92% (!) 86% 95% 93%  Weight:      Height:        Intake/Output Summary (Last 24 hours) at 12/21/2024 1642 Last data filed at 12/21/2024 1500 Gross per 24 hour  Intake 600 ml  Output --  Net 600 ml   Filed Weights   12/15/24 0542  Weight: (!) 137.7 kg    Examination:  General: No acute distress. Cardiovascular: RRR Lungs: continued expiratory wheezing Neurological: Alert and oriented 3. Moves all extremities 4 with equal strength. Cranial nerves II through XII grossly intact. Extremities: No clubbing or cyanosis. No edema.   Data Reviewed: I have personally reviewed following labs and imaging studies  CBC: Recent Labs  Lab 12/15/24 0513 12/17/24 0532 12/18/24 0520 12/19/24 0503 12/20/24 0528  WBC 5.9 6.8 8.1 9.5 9.7  NEUTROABS  --  4.0 4.3 5.5 5.3  HGB 13.2 13.2 13.7 13.1 12.4  HCT 41.2 41.0 42.7 42.5 39.0  MCV 85.8 85.4 86.8 88.0 85.7  PLT 323 305 335 346 364  Basic Metabolic Panel: Recent Labs  Lab 12/15/24 0513 12/17/24 0532 12/18/24 0520 12/19/24 0503 12/20/24 0528  NA 139 138 141 141 139  K 3.8 3.8 3.5 3.8 3.6  CL 102 101 101 100 100  CO2 29 29 31  33* 34*  GLUCOSE 129* 108* 110* 110* 108*  BUN 10 12 16 14 11   CREATININE 0.56 0.67 0.79 0.67 0.64  CALCIUM  9.0 8.7* 8.9 8.9 8.7*  MG  --  2.2 2.2 2.2 2.2  PHOS  --  3.9 5.1* 4.2 4.0    GFR: Estimated Creatinine Clearance: 98.6 mL/min (by C-G formula based on SCr of 0.64 mg/dL).  Liver Function Tests: Recent  Labs  Lab 12/17/24 0532 12/18/24 0520 12/19/24 0503 12/20/24 0528  AST 16 16 18 17   ALT 11 12 15 12   ALKPHOS 91 99 98 90  BILITOT 0.3 0.2 0.3 0.3  PROT 6.1* 6.5 6.2* 6.0*  ALBUMIN  3.2* 3.7 3.4* 3.4*    CBG: No results for input(s): GLUCAP in the last 168 hours.   Recent Results (from the past 240 hours)  Resp panel by RT-PCR (RSV, Flu Pamela [name]&B, Covid) Anterior Nasal Swab     Status: Abnormal   Collection Time: 12/14/24 12:19 PM   Specimen: Anterior Nasal Swab  Result Value Ref Range Status   SARS Coronavirus 2 by RT PCR POSITIVE (Pamela [name]) NEGATIVE Final    Comment: (NOTE) SARS-CoV-2 target nucleic acids are DETECTED.  The SARS-CoV-2 RNA is generally detectable in upper respiratory specimens during the acute phase of infection. Positive results are indicative of the presence of the identified virus, but do not rule out bacterial infection or co-infection with other pathogens not detected by the test. Clinical correlation with patient history and other diagnostic information is necessary to determine patient infection status. The expected result is Negative.  Fact Sheet for Patients: bloggercourse.com  Fact Sheet for Healthcare Providers: seriousbroker.it  This test is not yet approved or cleared by the [place]  FDA and  has been authorized for detection and/or diagnosis of SARS-CoV-2 by FDA under an Emergency Use Authorization (EUA).  This EUA will remain in effect (meaning this test can be used) for the duration of  the COVID-19 declaration under Section 564(b)(1) of the Pamela [name] ct, 21 U.S.C. section 360bbb-3(b)(1), unless the authorization is terminated or revoked sooner.     Influenza Pamela [name] by PCR NEGATIVE NEGATIVE Final   Influenza B by PCR NEGATIVE NEGATIVE Final    Comment: (NOTE) The Xpert Xpress SARS-CoV-2/FLU/RSV plus assay is intended as an aid in the diagnosis of influenza from Nasopharyngeal swab specimens and should  not be used as Pamela [name] sole basis for treatment. Nasal washings and aspirates are unacceptable for Xpert Xpress SARS-CoV-2/FLU/RSV testing.  Fact Sheet for Patients: bloggercourse.com  Fact Sheet for Healthcare Providers: seriousbroker.it  This test is not yet approved or cleared by the [place]  FDA and has been authorized for detection and/or diagnosis of SARS-CoV-2 by FDA under an Emergency Use Authorization (EUA). This EUA will remain in effect (meaning this test can be used) for the duration of the COVID-19 declaration under Section 564(b)(1) of the Act, 21 U.S.C. section 360bbb-3(b)(1), unless the authorization is terminated or revoked.     Resp Syncytial Virus by PCR NEGATIVE NEGATIVE Final    Comment: (NOTE) Fact Sheet for Patients: bloggercourse.com  Fact Sheet for Healthcare Providers: seriousbroker.it  This test is not yet approved or cleared by the [place]  FDA and has been authorized for detection and/or diagnosis of SARS-CoV-2 by  FDA under an Emergency Use Authorization (EUA). This EUA will remain in effect (meaning this test can be used) for the duration of the COVID-19 declaration under Section 564(b)(1) of the Act, 21 U.S.C. section 360bbb-3(b)(1), unless the authorization is terminated or revoked.  Performed at [facility], [address] [place], [place] [address]   MRSA Next Gen by PCR, Nasal     Status: None   Collection Time: 12/14/24  5:14 PM   Specimen: Nasal Mucosa; Nasal Swab  Result Value Ref Range Status   MRSA by PCR Next Gen NOT DETECTED NOT DETECTED Final    Comment: (NOTE) The GeneXpert MRSA Assay (FDA approved for NASAL specimens only), is one component of Kimaya Whitlatch comprehensive MRSA colonization surveillance program. It is not intended to diagnose MRSA infection nor to guide or monitor treatment for MRSA infections. Test  performance is not FDA approved in patients less than 48 years old. Performed at [facility], [address], [place], [place] [address]          Radiology Studies: No results found.       Scheduled Meds:  albuterol   2.5 mg Nebulization TID   amitriptyline   10 mg Oral QHS   aspirin  EC  81 mg Oral Daily   atorvastatin   20 mg Oral QHS   budesonide -glycopyrrolate -formoterol   2 puff Inhalation BID   busPIRone   20 mg Oral TID   enoxaparin  (LOVENOX ) injection  40 mg Subcutaneous Q24H   levothyroxine   150 mcg Oral QAC breakfast   loratadine   10 mg Oral QPM   melatonin  10 mg Oral QHS   multivitamin with minerals  1 tablet Oral Daily   predniSONE   40 mg Oral Q breakfast   Followed by   NOREEN ON 12/24/2024] predniSONE   30 mg Oral Q breakfast   Followed by   NOREEN ON 12/27/2024] predniSONE   20 mg Oral Q breakfast   Followed by   NOREEN ON 12/30/2024] predniSONE   10 mg Oral Q breakfast   sertraline   150 mg Oral Daily   Continuous Infusions:   LOS: 7 days    Time spent: over 30 min     Meliton Monte, MD [facility]   To contact the attending provider between 7A-7P or the covering provider during after hours 7P-7A, please log into the web site [email or web address] and access using universal [place] password for that web site. If you do not have the password, please call the hospital operator.  12/21/2024, 4:42 PM    "

## 2024-12-21 NOTE — Progress Notes (Signed)
 SATURATION QUALIFICATIONS: (This note is used to comply with regulatory documentation for home oxygen)  Patient Saturations on Room Air at Rest = 91%  Patient Saturations on Room Air while Ambulating = 86%  Patient Saturations on 1 Liters of oxygen while Ambulating = 87%  Please briefly explain why patient needs home oxygen: to maintain appropriate SpO2 levels.   Sylvan Nest Kistler PT 12/21/2024  Acute Rehabilitation Services  Office [phone]

## 2024-12-21 NOTE — Progress Notes (Signed)
 PT Cancellation Note  Patient Details Name: Princella Jaskiewicz MRN: [id number] DOB: [birth year]   Cancelled Treatment:    Reason Eval/Treat Not Completed: Fatigue/lethargy limiting ability to participate (pt reports she's too tired to do any PT at present, she just walked to the bathroom and did some ADLs and is short of breath from that. SpO2 94% on 1 L O2 at rest. Will follow.)   Sylvan Delon Copp PT 12/21/2024  Acute Rehabilitation Services  Office [phone]

## 2024-12-22 DIAGNOSIS — J9601 Acute respiratory failure with hypoxia: Secondary | ICD-10-CM | POA: Diagnosis not present

## 2024-12-22 LAB — CBC WITH DIFFERENTIAL/PLATELET
Abs Immature Granulocytes: 0.17 10*3/uL — ABNORMAL HIGH (ref 0.00–0.07)
Basophils Absolute: 0.1 10*3/uL (ref 0.0–0.1)
Basophils Relative: 1 %
Eosinophils Absolute: 0.1 10*3/uL (ref 0.0–0.5)
Eosinophils Relative: 1 %
HCT: 39.9 % (ref 36.0–46.0)
Hemoglobin: 13 g/dL (ref 12.0–15.0)
Immature Granulocytes: 2 %
Lymphocytes Relative: 32 %
Lymphs Abs: 3.4 10*3/uL (ref 0.7–4.0)
MCH: 27.6 pg (ref 26.0–34.0)
MCHC: 32.6 g/dL (ref 30.0–36.0)
MCV: 84.7 fL (ref 80.0–100.0)
Monocytes Absolute: 0.9 10*3/uL (ref 0.1–1.0)
Monocytes Relative: 9 %
Neutro Abs: 6 10*3/uL (ref 1.7–7.7)
Neutrophils Relative %: 55 %
Platelets: 389 10*3/uL (ref 150–400)
RBC: 4.71 MIL/uL (ref 3.87–5.11)
RDW: 14.5 % (ref 11.5–15.5)
WBC: 10.7 10*3/uL — ABNORMAL HIGH (ref 4.0–10.5)
nRBC: 0 % (ref 0.0–0.2)

## 2024-12-22 LAB — COMPREHENSIVE METABOLIC PANEL WITH GFR
ALT: 17 U/L (ref 0–44)
AST: 16 U/L (ref 15–41)
Albumin: 3.3 g/dL — ABNORMAL LOW (ref 3.5–5.0)
Alkaline Phosphatase: 87 U/L (ref 38–126)
Anion gap: 8 (ref 5–15)
BUN: 15 mg/dL (ref 8–23)
CO2: 31 mmol/L (ref 22–32)
Calcium: 9 mg/dL (ref 8.9–10.3)
Chloride: 100 mmol/L (ref 98–111)
Creatinine, Ser: 0.67 mg/dL (ref 0.44–1.00)
GFR, Estimated: >60 mL/min
Glucose, Bld: 93 mg/dL (ref 70–99)
Potassium: 3.8 mmol/L (ref 3.5–5.1)
Sodium: 139 mmol/L (ref 135–145)
Total Bilirubin: 0.3 mg/dL (ref 0.0–1.2)
Total Protein: 6 g/dL — ABNORMAL LOW (ref 6.5–8.1)

## 2024-12-22 LAB — PHOSPHORUS: Phosphorus: 5.4 mg/dL — ABNORMAL HIGH (ref 2.5–4.6)

## 2024-12-22 LAB — MAGNESIUM: Magnesium: 2.4 mg/dL (ref 1.7–2.4)

## 2024-12-22 MED ORDER — ORAL CARE MOUTH RINSE: 15.0000 mL | OROMUCOSAL | Status: DC | PRN

## 2024-12-22 MED ORDER — POTASSIUM CHLORIDE CRYS ER 20 MEQ PO TBCR
40.0000 meq | EXTENDED_RELEASE_TABLET | Freq: Once | ORAL | Status: AC
  Administered 2024-12-22: 40 meq via ORAL
  Filled 2024-12-22: qty 2

## 2024-12-22 MED ORDER — FUROSEMIDE 10 MG/ML IJ SOLN
40.0000 mg | Freq: Once | INTRAMUSCULAR | Status: AC
  Administered 2024-12-22: 40 mg via INTRAVENOUS
  Filled 2024-12-22: qty 4

## 2024-12-22 NOTE — Progress Notes (Signed)
" °   12/22/24 0058  BiPAP/CPAP/SIPAP  BiPAP/CPAP/SIPAP Pt Type Adult  BiPAP/CPAP/SIPAP Resmed (self placement)  Mask Type Nasal mask  Mask Size Small  Patient Home Machine No  Patient Home Mask No  Patient Home Tubing No  Auto Titrate Yes  Minimum cmH2O 5 cmH2O  Maximum cmH2O 12 cmH2O  Device Plugged into RED Power Outlet Yes    "

## 2024-12-22 NOTE — Plan of Care (Signed)
  Problem: Education: Goal: Knowledge of risk factors and measures for prevention of condition will improve Outcome: Progressing   Problem: Coping: Goal: Psychosocial and spiritual needs will be supported Outcome: Progressing   Problem: Respiratory: Goal: Will maintain a patent airway Outcome: Progressing Goal: Complications related to the disease process, condition or treatment will be avoided or minimized Outcome: Progressing   Problem: Education: Goal: Knowledge of General Education information will improve Description: Including pain rating scale, medication(s)/side effects and non-pharmacologic comfort measures Outcome: Progressing   Problem: Health Behavior/Discharge Planning: Goal: Ability to manage health-related needs will improve Outcome: Progressing   Problem: Clinical Measurements: Goal: Ability to maintain clinical measurements within normal limits will improve Outcome: Progressing Goal: Will remain free from infection Outcome: Progressing Goal: Diagnostic test results will improve Outcome: Progressing Goal: Respiratory complications will improve Outcome: Progressing Goal: Cardiovascular complication will be avoided Outcome: Progressing   Problem: Activity: Goal: Risk for activity intolerance will decrease Outcome: Progressing   Problem: Nutrition: Goal: Adequate nutrition will be maintained Outcome: Progressing   Problem: Coping: Goal: Level of anxiety will decrease Outcome: Progressing   Problem: Elimination: Goal: Will not experience complications related to bowel motility Outcome: Progressing Goal: Will not experience complications related to urinary retention Outcome: Progressing   Problem: Pain Managment: Goal: General experience of comfort will improve and/or be controlled Outcome: Progressing   Problem: Safety: Goal: Ability to remain free from injury will improve Outcome: Progressing   Problem: Skin Integrity: Goal: Risk for impaired  skin integrity will decrease Outcome: Progressing   Problem: Education: Goal: Knowledge of disease or condition will improve Outcome: Progressing Goal: Knowledge of the prescribed therapeutic regimen will improve Outcome: Progressing Goal: Individualized Educational Video(s) Outcome: Progressing   Problem: Activity: Goal: Ability to tolerate increased activity will improve Outcome: Progressing Goal: Will verbalize the importance of balancing activity with adequate rest periods Outcome: Progressing   Problem: Respiratory: Goal: Ability to maintain a clear airway will improve Outcome: Progressing Goal: Levels of oxygenation will improve Outcome: Progressing Goal: Ability to maintain adequate ventilation will improve Outcome: Progressing

## 2024-12-22 NOTE — Progress Notes (Addendum)
 " PROGRESS NOTE    Pamela [name]  FMW:[id number] DOB: 11/17/1961 DOA: 12/14/2024 PCP: Frann Mabel Mt, DO  Chief Complaint  Patient presents with   Shortness of Breath    Brief Narrative:   [name] is [name] 64 year old female with history of severe COPD, anxiety, depression, hyperlipidemia, hypothyroid, GERD admitted with COPD exacerbation in setting of COVID 19 infection.  She's had slow improvement.  Continues to have DOE.  Discharge pending further improvement in her respiratory status.  Hopefully soon, but she's rather anxious which is playing Pamela [name] role.    Assessment & Plan:   Principal Problem:   Acute hypoxic respiratory failure (HCC) Active Problems:   Chronic obstructive pulmonary disease (HCC)   COVID-19 virus infection   Hypothyroidism   Other hyperlipidemia   Morbid obesity (HCC)   Anxiety  Acute hypoxic respiratory failure (HCC) COVID-19 infection  COPD exacerbation Continue steroids, scheduled, and prn nebs  Ohtuvarye if someone can bring it, but unfortunately, no one available to help bring it in CT PE protocol with centrilobular emphysema, no PE on 1/24 She notes continued shortness of breath especially with exertion - for now, continuing supportive care with steroids and nebs.   Seemed to have better day after lasix  yesterday - will continue this as tolerated (ordering 1x dose today and will follow) Will see if anyone is available to bring in her home OHTUVARYE  On 1-2 L , RA at times She's concerned about returning home by herself.  Anxiety is playing Pamela [name] role, continue reassurance and supportive care - discussed possibility (and even the likelihood) that she may not get back to her previous baseline.  Will likely need oxygen at discharge.  Chronic obstructive pulmonary disease (HCC) Continue home Breztri  Patient reports she has Yailyn Strack nebulizer machine and nebulizer solution at home Continue low dose CT cancer screening outpatient   COVID-19 virus  infection Airborne precautions   Hypothyroidism Home levothyroxine  150 mcg daily before breakfast resumed   Other hyperlipidemia Home atorvastatin  20 mg nightly resumed   Anxiety Home sertraline  150 mg daily, buspirone  20 mg 3 times daily, amitriptyline  100 mg nightly were resumed  Class III Obesity Body mass index is 52.11 kg/m.     DVT prophylaxis: lovenox  Code Status: full Family Communication: none Disposition:   Status is: Inpatient Remains inpatient appropriate because: need for continued inpatient care   Consultants:  none  Procedures:  none  Antimicrobials:  Anti-infectives (From admission, onward)    None       Subjective:  Yesterday was Pamela [name] better day She remains extremely anxious about discharge home Continued DOE   Objective: Vitals:   12/22/24 0814 12/22/24 0815 12/22/24 1315 12/22/24 1403  BP:   113/84   Pulse:   76   Resp:   18   Temp:   98.7 F (37.1 C)   TempSrc:   Oral   SpO2: 93% 93% 93% 94%  Weight:      Height:        Intake/Output Summary (Last 24 hours) at 12/22/2024 1428 Last data filed at 12/22/2024 0900 Gross per 24 hour  Intake 696 ml  Output --  Net 696 ml   Filed Weights   12/15/24 0542  Weight: (!) 137.7 kg    Examination:  General: No acute distress. Cardiovascular: RRR Lungs: continued expiratory wheezing, transmitted upper airway sounds Neurological: Alert and oriented 3. Moves all extremities 4 with equal strength. Cranial nerves II through XII grossly intact. Extremities: No clubbing or  cyanosis. No edema.   Data Reviewed: I have personally reviewed following labs and imaging studies  CBC: Recent Labs  Lab 12/17/24 0532 12/18/24 0520 12/19/24 0503 12/20/24 0528 12/22/24 0532  WBC 6.8 8.1 9.5 9.7 10.7*  NEUTROABS 4.0 4.3 5.5 5.3 6.0  HGB 13.2 13.7 13.1 12.4 13.0  HCT 41.0 42.7 42.5 39.0 39.9  MCV 85.4 86.8 88.0 85.7 84.7  PLT 305 335 346 364 389    Basic Metabolic Panel: Recent Labs   Lab 12/17/24 0532 12/18/24 0520 12/19/24 0503 12/20/24 0528 12/22/24 0532  NA 138 141 141 139 139  K 3.8 3.5 3.8 3.6 3.8  CL 101 101 100 100 100  CO2 29 31 33* 34* 31  GLUCOSE 108* 110* 110* 108* 93  BUN 12 16 14 11 15   CREATININE 0.67 0.79 0.67 0.64 0.67  CALCIUM  8.7* 8.9 8.9 8.7* 9.0  MG 2.2 2.2 2.2 2.2 2.4  PHOS 3.9 5.1* 4.2 4.0 5.4*    GFR: Estimated Creatinine Clearance: 98.6 mL/min (by C-G formula based on SCr of 0.67 mg/dL).  Liver Function Tests: Recent Labs  Lab 12/17/24 0532 12/18/24 0520 12/19/24 0503 12/20/24 0528 12/22/24 0532  AST 16 16 18 17 16   ALT 11 12 15 12 17   ALKPHOS 91 99 98 90 87  BILITOT 0.3 0.2 0.3 0.3 0.3  PROT 6.1* 6.5 6.2* 6.0* 6.0*  ALBUMIN  3.2* 3.7 3.4* 3.4* 3.3*    CBG: No results for input(s): GLUCAP in the last 168 hours.   Recent Results (from the past 240 hours)  Resp panel by RT-PCR (RSV, Flu Myrakle Wingler&B, Covid) Anterior Nasal Swab     Status: Abnormal   Collection Time: 12/14/24 12:19 PM   Specimen: Anterior Nasal Swab  Result Value Ref Range Status   SARS Coronavirus 2 by RT PCR POSITIVE ([name]) NEGATIVE Final    Comment: (NOTE) SARS-CoV-2 target nucleic acids are DETECTED.  The SARS-CoV-2 RNA is generally detectable in upper respiratory specimens during the acute phase of infection. Positive results are indicative of the presence of the identified virus, but do not rule out bacterial infection or co-infection with other pathogens not detected by the test. Clinical correlation with patient history and other diagnostic information is necessary to determine patient infection status. The expected result is Negative.  Fact Sheet for Patients: bloggercourse.com  Fact Sheet for Healthcare Providers: seriousbroker.it  This test is not yet approved or cleared by the [place]  FDA and  has been authorized for detection and/or diagnosis of SARS-CoV-2 by FDA under an Emergency Use  Authorization (EUA).  This EUA will remain in effect (meaning this test can be used) for the duration of  the COVID-19 declaration under Section 564(b)(1) of the Laronda Lisby ct, 21 U.S.C. section 360bbb-3(b)(1), unless the authorization is terminated or revoked sooner.     Influenza Jeaneane Adamec by PCR NEGATIVE NEGATIVE Final   Influenza B by PCR NEGATIVE NEGATIVE Final    Comment: (NOTE) The Xpert Xpress SARS-CoV-2/FLU/RSV plus assay is intended as an aid in the diagnosis of influenza from Nasopharyngeal swab specimens and should not be used as Hector Taft sole basis for treatment. Nasal washings and aspirates are unacceptable for Xpert Xpress SARS-CoV-2/FLU/RSV testing.  Fact Sheet for Patients: bloggercourse.com  Fact Sheet for Healthcare Providers: seriousbroker.it  This test is not yet approved or cleared by the [place]  FDA and has been authorized for detection and/or diagnosis of SARS-CoV-2 by FDA under an Emergency Use Authorization (EUA). This EUA will remain in effect (meaning this test  can be used) for the duration of the COVID-19 declaration under Section 564(b)(1) of the Act, 21 U.S.C. section 360bbb-3(b)(1), unless the authorization is terminated or revoked.     Resp Syncytial Virus by PCR NEGATIVE NEGATIVE Final    Comment: (NOTE) Fact Sheet for Patients: bloggercourse.com  Fact Sheet for Healthcare Providers: seriousbroker.it  This test is not yet approved or cleared by the [place]  FDA and has been authorized for detection and/or diagnosis of SARS-CoV-2 by FDA under an Emergency Use Authorization (EUA). This EUA will remain in effect (meaning this test can be used) for the duration of the COVID-19 declaration under Section 564(b)(1) of the Act, 21 U.S.C. section 360bbb-3(b)(1), unless the authorization is terminated or revoked.  Performed at [facility], [address] [place], [place] [address]   MRSA Next Gen by PCR, Nasal     Status: None   Collection Time: 12/14/24  5:14 PM   Specimen: Nasal Mucosa; Nasal Swab  Result Value Ref Range Status   MRSA by PCR Next Gen NOT DETECTED NOT DETECTED Final    Comment: (NOTE) The GeneXpert MRSA Assay (FDA approved for NASAL specimens only), is one component of Giavonna Pflum comprehensive MRSA colonization surveillance program. It is not intended to diagnose MRSA infection nor to guide or monitor treatment for MRSA infections. Test performance is not FDA approved in patients less than 59 years old. Performed at [facility], [address] [place], [place] [address]          Radiology Studies: No results found.       Scheduled Meds:  albuterol   2.5 mg Nebulization TID   amitriptyline   10 mg Oral QHS   aspirin  EC  81 mg Oral Daily   atorvastatin   20 mg Oral QHS   budesonide -glycopyrrolate -formoterol   2 puff Inhalation BID   busPIRone   20 mg Oral TID   enoxaparin  (LOVENOX ) injection  40 mg Subcutaneous Q24H   levothyroxine   150 mcg Oral QAC breakfast   loratadine   10 mg Oral QPM   melatonin  10 mg Oral QHS   multivitamin with minerals  1 tablet Oral Daily   predniSONE   40 mg Oral Q breakfast   Followed by   NOREEN ON 12/24/2024] predniSONE   30 mg Oral Q breakfast   Followed by   NOREEN ON 12/27/2024] predniSONE   20 mg Oral Q breakfast   Followed by   NOREEN ON 12/30/2024] predniSONE   10 mg Oral Q breakfast   sertraline   150 mg Oral Daily   Continuous Infusions:   LOS: 8 days    Time spent: over 30 min     Meliton Monte, MD [facility]   To contact the attending provider between 7A-7P or the covering provider during after hours 7P-7A, please log into the web site [email or web address] and access using universal [place] password for that web site. If you do not have the password, please call the hospital operator.  12/22/2024, 2:28 PM    "

## 2024-12-23 LAB — BASIC METABOLIC PANEL WITH GFR
Anion gap: 9 (ref 5–15)
BUN: 15 mg/dL (ref 8–23)
CO2: 31 mmol/L (ref 22–32)
Calcium: 9.2 mg/dL (ref 8.9–10.3)
Chloride: 100 mmol/L (ref 98–111)
Creatinine, Ser: 0.84 mg/dL (ref 0.44–1.00)
GFR, Estimated: >60 mL/min
Glucose, Bld: 95 mg/dL (ref 70–99)
Potassium: 4.4 mmol/L (ref 3.5–5.1)
Sodium: 140 mmol/L (ref 135–145)

## 2024-12-23 LAB — CBC
HCT: 40.8 % (ref 36.0–46.0)
Hemoglobin: 13.6 g/dL (ref 12.0–15.0)
MCH: 28.3 pg (ref 26.0–34.0)
MCHC: 33.3 g/dL (ref 30.0–36.0)
MCV: 84.8 fL (ref 80.0–100.0)
Platelets: 359 10*3/uL (ref 150–400)
RBC: 4.81 MIL/uL (ref 3.87–5.11)
RDW: 14.6 % (ref 11.5–15.5)
WBC: 11.6 10*3/uL — ABNORMAL HIGH (ref 4.0–10.5)
nRBC: 0 % (ref 0.0–0.2)

## 2024-12-23 MED ORDER — FUROSEMIDE 10 MG/ML IJ SOLN
40.0000 mg | Freq: Once | INTRAMUSCULAR | Status: AC
  Administered 2024-12-23: 40 mg via INTRAVENOUS
  Filled 2024-12-23: qty 4

## 2024-12-23 NOTE — Progress Notes (Signed)
" °   12/23/24 0000  BiPAP/CPAP/SIPAP  BiPAP/CPAP/SIPAP Pt Type Adult  BiPAP/CPAP/SIPAP Resmed (self placement)  Mask Type Nasal mask  Dentures removed? Not applicable  Mask Size Small  Patient Home Machine No  Patient Home Mask No  Patient Home Tubing No  Auto Titrate Yes  Minimum cmH2O 5 cmH2O  Maximum cmH2O 12 cmH2O  Device Plugged into RED Power Outlet Yes    "

## 2024-12-23 NOTE — Progress Notes (Signed)
" °   12/23/24 2322  BiPAP/CPAP/SIPAP  BiPAP/CPAP/SIPAP Pt Type Adult  BiPAP/CPAP/SIPAP Resmed  Mask Type Nasal mask  Dentures removed? Not applicable  Flow Rate 1 lpm  Patient Home Machine No  Patient Home Mask No  Patient Home Tubing No  Auto Titrate Yes  BiPAP/CPAP /SiPAP Vitals  SpO2 94 %  Bilateral Breath Sounds Clear;Diminished    "

## 2024-12-23 NOTE — Plan of Care (Signed)
  Problem: Education: Goal: Knowledge of risk factors and measures for prevention of condition will improve Outcome: Progressing   Problem: Coping: Goal: Psychosocial and spiritual needs will be supported Outcome: Progressing   Problem: Respiratory: Goal: Will maintain a patent airway Outcome: Progressing Goal: Complications related to the disease process, condition or treatment will be avoided or minimized Outcome: Progressing   Problem: Education: Goal: Knowledge of General Education information will improve Description: Including pain rating scale, medication(s)/side effects and non-pharmacologic comfort measures Outcome: Progressing   Problem: Health Behavior/Discharge Planning: Goal: Ability to manage health-related needs will improve Outcome: Progressing   Problem: Clinical Measurements: Goal: Ability to maintain clinical measurements within normal limits will improve Outcome: Progressing Goal: Will remain free from infection Outcome: Progressing Goal: Diagnostic test results will improve Outcome: Progressing Goal: Respiratory complications will improve Outcome: Progressing Goal: Cardiovascular complication will be avoided Outcome: Progressing   Problem: Activity: Goal: Risk for activity intolerance will decrease Outcome: Progressing   Problem: Nutrition: Goal: Adequate nutrition will be maintained Outcome: Progressing   Problem: Coping: Goal: Level of anxiety will decrease Outcome: Progressing   Problem: Elimination: Goal: Will not experience complications related to bowel motility Outcome: Progressing Goal: Will not experience complications related to urinary retention Outcome: Progressing   Problem: Pain Managment: Goal: General experience of comfort will improve and/or be controlled Outcome: Progressing   Problem: Safety: Goal: Ability to remain free from injury will improve Outcome: Progressing   Problem: Skin Integrity: Goal: Risk for impaired  skin integrity will decrease Outcome: Progressing   Problem: Education: Goal: Knowledge of disease or condition will improve Outcome: Progressing Goal: Knowledge of the prescribed therapeutic regimen will improve Outcome: Progressing Goal: Individualized Educational Video(s) Outcome: Progressing   Problem: Activity: Goal: Ability to tolerate increased activity will improve Outcome: Progressing Goal: Will verbalize the importance of balancing activity with adequate rest periods Outcome: Progressing   Problem: Respiratory: Goal: Ability to maintain a clear airway will improve Outcome: Progressing Goal: Levels of oxygenation will improve Outcome: Progressing Goal: Ability to maintain adequate ventilation will improve Outcome: Progressing

## 2024-12-23 NOTE — Progress Notes (Signed)
 Physical Therapy Treatment Patient Details Name: Pamela [name] MRN: [id number] DOB: Jun 23, 1961 Today's Date: 12/23/2024   History of Present Illness 64 yo female presents to therapy following hospital admission on 12/14/2024 due to SOB. Pt found to have acute respiratory failure secondary to COVID and COPD exacerbation.  Pt PMH includes but is not limited to: anemia, anxiety, asthma, COPD, depression, GERD, HLD, hypothyroidism, LBP, and OSA on CPAP.    PT Comments  Patient ambulates independently in room. Patient with no balance loss. Patient  remained on 1.5-2 L  with 2 extensions to [place]. SPO2 91% post ambulation and returned to 95% with stand rest break, dyspnea 2-3/4.   No further skilled PT needs. Will ask Mobility Team to follow patient until, DC. PT will sign off.    If plan is discharge home, recommend the following: Help with stairs or ramp for entrance;Assistance with cooking/housework;Assist for transportation   Can travel by private vehicle        Equipment Recommendations  None recommended by PT    Recommendations for Other Services       Precautions / Restrictions Precautions Recall of Precautions/Restrictions: Intact Precaution/Restrictions Comments: monitor O2 Restrictions Weight Bearing Restrictions Per Provider Order: No     Mobility  Bed Mobility Overal bed mobility: Independent                  Transfers Overall transfer level: Independent                      Ambulation/Gait Ambulation/Gait assistance: Independent Gait Distance (Feet): 80 Feet Assistive device: None Gait Pattern/deviations: Step-through pattern       General Gait Details: no balance  loss, moves quickly   Stairs             Wheelchair Mobility     Tilt Bed    Modified Rankin (Stroke Patients Only)       Balance Overall balance assessment: No apparent balance deficits (not formally assessed)                                           Communication Communication Communication: No apparent difficulties  Cognition Arousal: Alert Behavior During Therapy: WFL for tasks assessed/performed   PT - Cognitive impairments: No apparent impairments                                Cueing    Exercises      General Comments        Pertinent Vitals/Pain Pain Assessment Pain Assessment: No/denies pain    Home Living                          Prior Function            PT Goals (current goals can now be found in the care plan section) Progress towards PT goals: Progressing toward goals    Frequency     (PT to sign off)      PT Plan      Co-evaluation              AM-PAC PT 6 Clicks Mobility   Outcome Measure  Help needed turning from your back to your side while in a flat bed without using bedrails?: None Help needed  moving from lying on your back to sitting on the side of a flat bed without using bedrails?: None Help needed moving to and from a bed to a chair (including a wheelchair)?: None Help needed standing up from a chair using your arms (e.g., wheelchair or bedside chair)?: None Help needed to walk in hospital room?: None Help needed climbing 3-5 steps with a railing? : None 6 Click Score: 24    End of Session   Activity Tolerance: Patient tolerated treatment well Patient left: in bed;with bed alarm set Nurse Communication: Mobility status PT Visit Diagnosis: Difficulty in walking, not elsewhere classified (R26.2)     Time: 9065-9051 PT Time Calculation (min) (ACUTE ONLY): 14 min  Charges:    $Gait Training: 8-22 mins PT General Charges $$ ACUTE PT VISIT: 1 Visit                    Darice Potters PT Acute Rehabilitation Services Office [phone]    Potters Darice Norris 12/23/2024, 9:54 AM

## 2024-12-23 NOTE — Progress Notes (Signed)
 " PROGRESS NOTE    Pamela [name]  FMW:[id number] DOB: Dec 17, 1960 DOA: 12/14/2024 PCP: Frann Mabel Mt, DO    Brief Narrative:   Pamela [name] is a 64 y.o. female with past medical history significant for severe COPD, hypothyroidism, hyperlipidemia, anxiety/depression, GERD who presented to [facility] ED on 12/14/2024 with progressive shortness of breath, cough and congestion over the last 3 days.  Not oxygen dependent at baseline.  Minimal improvement with home nebulizer.  Reports multiple sick contacts at work.  Sputum production white/clear.  Denies fever/chills, no nausea/diarrhea, no urinary symptoms, no abdominal pain.  In the ED, temperature 97.6 F, HR 106, RR 21, BP 140/105, SpO2 92% on 3 L nasal cannula.  WBC 6.4, hemoglobin 14.1, platelet count 324.  Sodium 140, potassium 3.6, chloride 100, CO2 27, glucose 118, BUN 10, creatinine 0.64.  COVID PCR positive.  RSV/influenza PCR negative.  Patient started on albuterol  neb, DuoNebs, prednisone  40 mg x 1, Ativan  1 mg IV x 1.  TRH consulted for admission and patient was transferred to [facility] for further evaluation and management of acute hypoxic respiratory failure secondary to COPD exacerbation, Cova-19 viral infection  Assessment & Plan:   Acute hypoxic respiratory  failure, POA COPD exacerbation COVID-19 viral infection Patient presenting to ED with 3-day history of progressive shortness of breath, cough and congestion.  Reports multiple sick contacts at work.  Patient was afebrile without leukocytosis.  Cova-19 PCR positive.  BNP less than 50.0.  CRP 0.5.  CT angiogram chest negative for pulmonary embolism, noted mild central lobar emphysema, mild dilation of central pulmonary arteries and proximal ascending thoracic aorta; no focal consolidation. -- Breztri  2 puffs twice daily -- Albuterol  neb 3 times daily (q2h PRN wheezing/shortness of breath) -- Prednisone  taper -- On OHTUVAYRE  neb inhaled twice daily  at home -- Airborne/contact isolation precautions -- Continue supplemental oxygen comment SpO2 greater than 88%; O2 walk screen 1/26, qualifies for O2, TOC has set up  Hypothyroidism -- Levothyroxine  150 mcg p.o. daily.  Hyperlipidemia -- Atorvastatin  20 mg p.o. daily  Anxiety/depression -- Zoloft  150 p.o. daily -- Amitriptyline  10 mg p.o. nightly -- BuSpar  20 mg p.o. 3 times daily -- Hydroxyzine  50 mg p.o. 4 times daily as needed anxiety  GERD -- Protonix  80 mg p.o. daily PRN heartburn/indigestion (on omeprazole  outpatient)  Obesity, class III Body mass index is 52.34 kg/m.   DVT prophylaxis: enoxaparin  (LOVENOX ) injection 40 mg Start: 12/14/24 2200 Place TED hose Start: 12/14/24 1546    Code Status: Full Code Family Communication: No family present at bedside this morning  Disposition Plan:  Level of care: Telemetry Status is: Inpatient Remains inpatient appropriate because: Anticipate discharge home tomorrow    Consultants:  None none  Procedures:  None  Antimicrobials:  None   Subjective: Patient seen examined at bedside, sitting edge of bed.  Dyspnea improved.  Remains on 2 L nasal.  Discussed with patient need for home O2 which has been set up by TOC already.  No other questions or concerns at this time.  Denies headache, no dizziness, no chest pain, no palpitations, no abdominal pain, no fever/chills/night sweats, no nausea/vomiting/diarrhea, no focal weakness, no fatigue, no paresthesias.  No acute events overnight per nursing staff.  Objective: Vitals:   12/22/24 2008 12/23/24 0425 12/23/24 0500 12/23/24 0930  BP: (!) 140/80 129/74    Pulse: 76 (!) 57    Resp: 15 15    Temp: 98.7 F (37.1 C) 98.3 F (36.8 C)  TempSrc: Oral Oral    SpO2: 94% 94%  95%  Weight:   (!) 138.3 kg   Height:        Intake/Output Summary (Last 24 hours) at 12/23/2024 1243 Last data filed at 12/23/2024 0845 Gross per 24 hour  Intake 160 ml  Output --  Net 160 ml    Filed Weights   12/15/24 0542 12/23/24 0500  Weight: (!) 137.7 kg (!) 138.3 kg    Examination:  Physical Exam: GEN: NAD, alert and oriented x 3, chronically ill appearance, appears older than stated age, obese HEENT: NCAT, PERRL, EOMI, sclera clear, MMM PULM: Breath sounds slight diminished bilateral bases, no wheezes/crackles, normal respiratory effort without accessory muscle use, on 2 L nasal cannula with SpO2 95% at rest CV: RRR w/o M/G/R GI: abd soft, NTND, + BS MSK: no peripheral edema, moves all extremities independently with preserved muscle strength NEURO: No focal neurological deficits PSYCH: normal mood/affect Integumentary: No concerning rashes/lesions/wounds noted on exposed skin surfaces    Data Reviewed: I have personally reviewed following labs and imaging studies  CBC: Recent Labs  Lab 12/17/24 0532 12/18/24 0520 12/19/24 0503 12/20/24 0528 12/22/24 0532 12/23/24 0536  WBC 6.8 8.1 9.5 9.7 10.7* 11.6*  NEUTROABS 4.0 4.3 5.5 5.3 6.0  --   HGB 13.2 13.7 13.1 12.4 13.0 13.6  HCT 41.0 42.7 42.5 39.0 39.9 40.8  MCV 85.4 86.8 88.0 85.7 84.7 84.8  PLT 305 335 346 364 389 359   Basic Metabolic Panel: Recent Labs  Lab 12/17/24 0532 12/18/24 0520 12/19/24 0503 12/20/24 0528 12/22/24 0532 12/23/24 0536  NA 138 141 141 139 139 140  K 3.8 3.5 3.8 3.6 3.8 4.4  CL 101 101 100 100 100 100  CO2 29 31 33* 34* 31 31  GLUCOSE 108* 110* 110* 108* 93 95  BUN 12 16 14 11 15 15   CREATININE 0.67 0.79 0.67 0.64 0.67 0.84  CALCIUM  8.7* 8.9 8.9 8.7* 9.0 9.2  MG 2.2 2.2 2.2 2.2 2.4  --   PHOS 3.9 5.1* 4.2 4.0 5.4*  --    GFR: Estimated Creatinine Clearance: 94.1 mL/min (by C-G formula based on SCr of 0.84 mg/dL). Liver Function Tests: Recent Labs  Lab 12/17/24 0532 12/18/24 0520 12/19/24 0503 12/20/24 0528 12/22/24 0532  AST 16 16 18 17 16   ALT 11 12 15 12 17   ALKPHOS 91 99 98 90 87  BILITOT 0.3 0.2 0.3 0.3 0.3  PROT 6.1* 6.5 6.2* 6.0* 6.0*  ALBUMIN   3.2* 3.7 3.4* 3.4* 3.3*   No results for input(s): LIPASE, AMYLASE in the last 168 hours. No results for input(s): AMMONIA in the last 168 hours. Coagulation Profile: No results for input(s): INR, PROTIME in the last 168 hours. Cardiac Enzymes: No results for input(s): CKTOTAL, CKMB, CKMBINDEX, TROPONINI in the last 168 hours. BNP (last 3 results) Recent Labs    12/19/24 0503  PROBNP <50.0   HbA1C: No results for input(s): HGBA1C in the last 72 hours. CBG: No results for input(s): GLUCAP in the last 168 hours. Lipid Profile: No results for input(s): CHOL, HDL, LDLCALC, TRIG, CHOLHDL, LDLDIRECT in the last 72 hours. Thyroid  Function Tests: No results for input(s): TSH, T4TOTAL, FREET4, T3FREE, THYROIDAB in the last 72 hours. Anemia Panel: No results for input(s): VITAMINB12, FOLATE, FERRITIN, TIBC, IRON, RETICCTPCT in the last 72 hours. Sepsis Labs: No results for input(s): PROCALCITON, LATICACIDVEN in the last 168 hours.  Recent Results (from the past 240 hours)  Resp panel by  RT-PCR (RSV, Flu A&B, Covid) Anterior Nasal Swab     Status: Abnormal   Collection Time: 12/14/24 12:19 PM   Specimen: Anterior Nasal Swab  Result Value Ref Range Status   SARS Coronavirus 2 by RT PCR POSITIVE (A) NEGATIVE Final    Comment: (NOTE) SARS-CoV-2 target nucleic acids are DETECTED.  The SARS-CoV-2 RNA is generally detectable in upper respiratory specimens during the acute phase of infection. Positive results are indicative of the presence of the identified virus, but do not rule out bacterial infection or co-infection with other pathogens not detected by the test. Clinical correlation with patient history and other diagnostic information is necessary to determine patient infection status. The expected result is Negative.  Fact Sheet for Patients: bloggercourse.com  Fact Sheet for Healthcare  Providers: seriousbroker.it  This test is not yet approved or cleared by the [place]  FDA and  has been authorized for detection and/or diagnosis of SARS-CoV-2 by FDA under an Emergency Use Authorization (EUA).  This EUA will remain in effect (meaning this test can be used) for the duration of  the COVID-19 declaration under Section 564(b)(1) of the A ct, 21 U.S.C. section 360bbb-3(b)(1), unless the authorization is terminated or revoked sooner.     Influenza A by PCR NEGATIVE NEGATIVE Final   Influenza B by PCR NEGATIVE NEGATIVE Final    Comment: (NOTE) The Xpert Xpress SARS-CoV-2/FLU/RSV plus assay is intended as an aid in the diagnosis of influenza from Nasopharyngeal swab specimens and should not be used as a sole basis for treatment. Nasal washings and aspirates are unacceptable for Xpert Xpress SARS-CoV-2/FLU/RSV testing.  Fact Sheet for Patients: bloggercourse.com  Fact Sheet for Healthcare Providers: seriousbroker.it  This test is not yet approved or cleared by the [place]  FDA and has been authorized for detection and/or diagnosis of SARS-CoV-2 by FDA under an Emergency Use Authorization (EUA). This EUA will remain in effect (meaning this test can be used) for the duration of the COVID-19 declaration under Section 564(b)(1) of the Act, 21 U.S.C. section 360bbb-3(b)(1), unless the authorization is terminated or revoked.     Resp Syncytial Virus by PCR NEGATIVE NEGATIVE Final    Comment: (NOTE) Fact Sheet for Patients: bloggercourse.com  Fact Sheet for Healthcare Providers: seriousbroker.it  This test is not yet approved or cleared by the [place]  FDA and has been authorized for detection and/or diagnosis of SARS-CoV-2 by FDA under an Emergency Use Authorization (EUA). This EUA will remain in effect (meaning this test can be  used) for the duration of the COVID-19 declaration under Section 564(b)(1) of the Act, 21 U.S.C. section 360bbb-3(b)(1), unless the authorization is terminated or revoked.  Performed at [facility], [address] [place], [place] [address]   MRSA Next Gen by PCR, Nasal     Status: None   Collection Time: 12/14/24  5:14 PM   Specimen: Nasal Mucosa; Nasal Swab  Result Value Ref Range Status   MRSA by PCR Next Gen NOT DETECTED NOT DETECTED Final    Comment: (NOTE) The GeneXpert MRSA Assay (FDA approved for NASAL specimens only), is one component of a comprehensive MRSA colonization surveillance program. It is not intended to diagnose MRSA infection nor to guide or monitor treatment for MRSA infections. Test performance is not FDA approved in patients less than 22 years old. Performed at [facility], [address], [place], [place] [address]          Radiology Studies: No results found.  Scheduled Meds:  albuterol   2.5 mg Nebulization TID   amitriptyline   10 mg Oral QHS   aspirin  EC  81 mg Oral Daily   atorvastatin   20 mg Oral QHS   budesonide -glycopyrrolate -formoterol   2 puff Inhalation BID   busPIRone   20 mg Oral TID   enoxaparin  (LOVENOX ) injection  40 mg Subcutaneous Q24H   levothyroxine   150 mcg Oral QAC breakfast   loratadine   10 mg Oral QPM   melatonin  10 mg Oral QHS   multivitamin with minerals  1 tablet Oral Daily   [START ON 12/24/2024] predniSONE   30 mg Oral Q breakfast   Followed by   NOREEN ON 12/27/2024] predniSONE   20 mg Oral Q breakfast   Followed by   NOREEN ON 12/30/2024] predniSONE   10 mg Oral Q breakfast   sertraline   150 mg Oral Daily   Continuous Infusions:   LOS: 9 days    Time spent: 52 minutes spent on 12/23/2024 caring for this patient face-to-face including chart review, ordering labs/tests, documenting, discussion with nursing staff, consultants, updating family and interview/physical exam    Camellia PARAS Cairo Lingenfelter,  DO [facility] Available via Epic secure chat 7am-7pm After these hours, please refer to coverage provider listed on [email or web address] 12/23/2024, 12:43 PM   "

## 2024-12-23 NOTE — Progress Notes (Signed)
 Mobility Specialist - Progress Note:   12/23/24 1626  Oxygen Therapy  SpO2 90 %  O2 Device Nasal Cannula  O2 Flow Rate (L/min) 2 L/min  Mobility  Activity Ambulated independently  Level of Assistance Independent  Assistive Device None  Distance Ambulated (ft) 75 ft  Activity Response Tolerated well  Mobility Referral Yes  Mobility visit 1 Mobility  Mobility Specialist Start Time (ACUTE ONLY) 1456  Mobility Specialist Stop Time (ACUTE ONLY) 1506  Mobility Specialist Time Calculation (min) (ACUTE ONLY) 10 min   Pt was received in bed and agreed to mobility in room. No complaints/issues during session; Pt states breathing feels much better than previous days. Returned to bed with all needs met.  [employer] - Mobility Specialist - Acute Rehabilitation Can be reached via [employer]

## 2024-12-24 ENCOUNTER — Other Ambulatory Visit (HOSPITAL_COMMUNITY): Payer: Self-pay

## 2024-12-24 ENCOUNTER — Other Ambulatory Visit: Payer: Self-pay

## 2024-12-24 ENCOUNTER — Other Ambulatory Visit: Payer: Self-pay | Admitting: Internal Medicine

## 2024-12-24 DIAGNOSIS — J1282 Pneumonia due to coronavirus disease 2019: Secondary | ICD-10-CM

## 2024-12-24 MED ORDER — PREDNISONE 10 MG PO TABS
ORAL_TABLET | ORAL | 0 refills | Status: AC
  Filled 2024-12-24: qty 15, 8d supply, fill #0

## 2024-12-24 NOTE — Progress Notes (Signed)
 Discharge meds in a secure bag delivered to patient by this RN

## 2024-12-24 NOTE — Discharge Summary (Signed)
 " Physician Discharge Summary  Pamela [name] FMW:[id number] DOB: February 02, 1961 DOA: 12/14/2024  PCP: Frann Mabel Mt, DO  Admit date: 12/14/2024 Discharge date: 12/24/2024  Admitted From: Home Disposition: Home  Recommendations for Outpatient Follow-up:  Follow up with PCP in 1-2 weeks Follow-up with pulmonology, 2-3 weeks Continue prednisone  taper on discharge  Home Health: No Equipment/Devices: Oxygen 2 L nasal cannula  Discharge Condition: Stable CODE STATUS: Full code Diet recommendation: Regular diet  History of present illness:  Pamela [name] is a 64 y.o. female with past medical history significant for severe COPD, hypothyroidism, hyperlipidemia, anxiety/depression, GERD who presented to [facility] ED on 12/14/2024 with progressive shortness of breath, cough and congestion over the last 3 days.  Not oxygen dependent at baseline.  Minimal improvement with home nebulizer.  Reports multiple sick contacts at work.  Sputum production white/clear.  Denies fever/chills, no nausea/diarrhea, no urinary symptoms, no abdominal pain.   In the ED, temperature 97.6 F, HR 106, RR 21, BP 140/105, SpO2 92% on 3 L nasal cannula.  WBC 6.4, hemoglobin 14.1, platelet count 324.  Sodium 140, potassium 3.6, chloride 100, CO2 27, glucose 118, BUN 10, creatinine 0.64.  COVID PCR positive.  RSV/influenza PCR negative.  Patient started on albuterol  neb, DuoNebs, prednisone  40 mg x 1, Ativan  1 mg IV x 1.  TRH consulted for admission and patient was transferred to [facility] for further evaluation and management of acute hypoxic respiratory failure secondary to COPD exacerbation, Covid-19 viral infection  Hospital course:  Acute hypoxic respiratory  failure, POA COPD exacerbation COVID-19 viral infection Patient presenting to ED with 3-day history of progressive shortness of breath, cough and congestion.  Reports multiple sick contacts at work.  Patient was afebrile without  leukocytosis.  Cova-19 PCR positive.  BNP less than 50.0.  CRP 0.5.  CT angiogram chest negative for pulmonary embolism, noted mild central lobar emphysema, mild dilation of central pulmonary arteries and proximal ascending thoracic aorta; no focal consolidation.  Patient was started on IV steroids and transition to prednisone  and will continue taper on discharge.  Continue Breztri , OHTUVAYRE  neb inhaled twice daily.  Patient will require home oxygen on discharge.  Outpatient follow-up with pulmonology.  Hypothyroidism Continue levothyroxine     Hyperlipidemia Atorvastatin  20 mg p.o. daily   Anxiety/depression Continue Zoloft , Amitriptyline , BuSpar  , Hydroxyzine  PRN   GERD Continue omeprazole    Obesity, class III Body mass index is 52.34 kg/m.  Discharge Diagnoses:  Principal Problem:   Acute hypoxic respiratory failure (HCC) Active Problems:   Chronic obstructive pulmonary disease (HCC)   COVID-19 virus infection   Hypothyroidism   Other hyperlipidemia   Morbid obesity (HCC)   Anxiety    Discharge Instructions  Discharge Instructions     Call MD for:  difficulty breathing, headache or visual disturbances   Complete by: As directed    Call MD for:  extreme fatigue   Complete by: As directed    Call MD for:  persistant dizziness or light-headedness   Complete by: As directed    Call MD for:  persistant nausea and vomiting   Complete by: As directed    Call MD for:  severe uncontrolled pain   Complete by: As directed    Call MD for:  temperature >100.4   Complete by: As directed    Increase activity slowly   Complete by: As directed       Allergies as of 12/24/2024   No Known Allergies      Medication  List     STOP taking these medications    ketoconazole  2 % cream Commonly known as: NIZORAL        TAKE these medications    albuterol  108 (90 Base) MCG/ACT inhaler Commonly known as: VENTOLIN  HFA Inhale 1-2 puffs into the lungs every 6 (six) hours as  needed for wheezing or shortness of breath.   amitriptyline  10 MG tablet Commonly known as: ELAVIL  Take 1 tablet (10 mg total) by mouth at bedtime.   aspirin  EC 81 MG tablet Take 81 mg by mouth daily. Swallow whole.   atorvastatin  20 MG tablet Commonly known as: LIPITOR Take 1 tablet (20 mg total) by mouth daily.   Breztri  Aerosphere 160-9-4.8 MCG/ACT Aero inhaler Generic drug: budesonide -glycopyrrolate -formoterol  Inhale 2 puffs into the lungs 2 (two) times daily. Rinse mouth after use   busPIRone  10 MG tablet Commonly known as: BUSPAR  Take 20 mg by mouth 3 (three) times daily.   chlorhexidine  0.12 % solution Commonly known as: PERIDEX  Use as directed 15 mLs in the mouth or throat 2 (two) times daily.   clobetasol  0.05 % external solution Commonly known as: TEMOVATE  Apply 1 Application topically 2 (two) times daily as needed (irritation).   dicyclomine  10 MG capsule Commonly known as: BENTYL  Take 1 capsule (10 mg total) by mouth every 6 (six) hours as needed for spasms.   fluticasone  50 MCG/ACT nasal spray Commonly known as: FLONASE  Place 2 sprays into both nostrils 2 (two) times daily.   hydrOXYzine  50 MG capsule Commonly known as: VISTARIL  Take 50 mg by mouth 4 (four) times daily as needed.   ibuprofen 200 MG tablet Commonly known as: ADVIL Take 200 mg by mouth every 6 (six) hours as needed.   levocetirizine 5 MG tablet Commonly known as: Xyzal  Allergy 24HR Take 1 tablet (5 mg total) by mouth every evening.   levothyroxine  150 MCG tablet Commonly known as: SYNTHROID  Take 1 tablet (150 mcg total) by mouth daily before breakfast.   Melatonin 10 MG Caps Take 10 mg by mouth at bedtime.   modafinil  200 MG tablet Commonly known as: PROVIGIL  Take 1 tablet (200 mg total) by mouth in the morning.   Ohtuvayre  3 MG/2.5ML Susp Generic drug: Ensifentrine  Inhale one vial by nebulizer twice daily. Shake well before use. Do not mix with other nebulized medications.    omeprazole  40 MG capsule Commonly known as: PRILOSEC TAKE 1 CAPSULE(40 MG) BY MOUTH DAILY AS NEEDED FOR HEARTBURN   predniSONE  10 MG tablet Commonly known as: DELTASONE  Take 3 tablets (30 mg total) by mouth daily for 2 days, THEN 2 tablets (20 mg total) daily for 3 days, THEN 1 tablet (10 mg total) daily for 3 days. Start taking on: December 25, 2024   sertraline  100 MG tablet Commonly known as: ZOLOFT  Take 100 mg by mouth daily.        Follow-up Information     [employer] (DME) Follow up.   Specialty: DME Services Why: oxygen Contact information: [address] [place]  [address] [phone]        Frann Mabel Mt, DO. Schedule an appointment as soon as possible for a visit in 1 week(s).   Specialty: Family Medicine Contact information: [address] [place] [address] [phone]         Neda Jennet LABOR, MD. Schedule an appointment as soon as possible for a visit.   Specialty: Pulmonary Disease Contact information: [address]  [place] [address] [phone]                Allergies[1]  Consultations: None   Procedures/Studies: CT Angio Chest Pulmonary Embolism (PE) W or WO Contrast Result Date: 12/19/2024 EXAM: CTA of the Chest with contrast for PE 12/19/2024 12:41:45 PM TECHNIQUE: CTA of the chest was performed without and with the administration of 75 mL of iohexol  (OMNIPAQUE ) 350 MG/ML injection. Multiplanar reformatted images are provided for review. MIP images are provided for review. Automated exposure control, iterative reconstruction, and/or weight based adjustment of the mA/kV was utilized to reduce the radiation dose to as low as reasonably achievable. COMPARISON: 08/17/2024 CLINICAL HISTORY: Shortness of breath, worsening in setting of COVID-19 infection. FINDINGS: PULMONARY ARTERIES: Pulmonary arteries are adequately opacified for evaluation. Mild  dilatation of central pulmonary arteries. No pulmonary embolism. MEDIASTINUM: The heart and pericardium demonstrate no acute abnormality. 4cm mild dilatation of the proximal ascending thoracic aorta. Arch measures 3.5 cm diameter, descending segment normal in caliber, mildly tortuous with scattered partially calcified plaque. LYMPH NODES: No mediastinal, hilar or axillary lymphadenopathy. LUNGS AND PLEURA: Perifissural nodule adjacent to the minor fissure stable since previous. Minimal linear scarring or subsegmental atelectasis in the medial right lower lobe and lateral left lower lobe, stable. Mild centrilobular emphysema as before. Prominent motion degrades some of the images through the lung bases. No pleural effusion or pneumothorax. UPPER ABDOMEN: 7 mm partially calcified gallstone in the dependent aspect of the nondilated gallbladder. Small hiatal hernia. Retroaortic left renal vein, anatomic variant. SOFT TISSUES AND BONES: Bilateral breast implants, partially calcified. Vertebral endplate spurring at multiple levels in the lower thoracic spine. IMPRESSION: 1. No pulmonary embolism. 2. Mild centrilobular emphysema, stable, an independent risk factor for lung cancer, so recommend consideration for evaluation for a low-dose CT lung cancer screening program. 3. Mild dilatation of the central pulmonary arteries and proximal ascending thoracic aorta. Electronically signed by: Dayne Hassell MD 12/19/2024 01:15 PM EST RP Workstation: [id number]   DG CHEST PORT 1 VIEW Result Date: 12/17/2024 CLINICAL DATA:  Cough EXAM: PORTABLE CHEST 1 VIEW COMPARISON:  December 14, 2024 FINDINGS: Stable cardiomediastinal silhouette. Both lungs are clear. The visualized skeletal structures are unremarkable. IMPRESSION: No active disease. Electronically Signed   By: Lynwood Landy Raddle M.D.   On: 12/17/2024 15:54   DG Chest Portable 1 View Result Date: 12/14/2024 EXAM: 1 VIEW(S) XRAY OF THE CHEST 12/14/2024 12:07:38 PM COMPARISON:  Conversion CT 08/17/2024. CLINICAL HISTORY: sob FINDINGS: LUNGS AND PLEURA: No focal pulmonary opacity. No pleural effusion. No pneumothorax. HEART AND MEDIASTINUM: No acute abnormality of the cardiac and mediastinal silhouettes. BONES AND SOFT TISSUES: No acute osseous abnormality. IMPRESSION: 1. No acute process. Electronically signed by: Dayne Hassell MD 12/14/2024 12:30 PM EST RP Workstation: [id number]     Subjective: Patient seen examined bedside, lying in bed.  Asking about when RT will bring her Breztri  treatment this morning.  Otherwise no complaints.  Discharging home today.  Discussed needs close follow-up with her pulmonologist.  Denies headache, no dizziness, no chest pain, no palpitations, no shortness of breath more than her typical baseline, no fever/chills/night sweats, no nausea/vomiting/diarrhea, no focal weakness, no fatigue, no paresthesia.  No acute events overnight per nursing staff.  Discharge Exam: Vitals:   12/23/24 2322 12/24/24 0525  BP:  123/76  Pulse:  (!) 59  Resp:  15  Temp:  98.2 F (36.8 C)  SpO2: 94% 99%   Vitals:   12/23/24 2005 12/23/24 2322 12/24/24 0525 12/24/24 0800  BP: 118/83  123/76   Pulse: 76  (!) 59   Resp: 15  15   Temp: 98.5 F (36.9 C)  98.2 F (36.8 C)   TempSrc: Oral  Oral   SpO2: 92% 94% 99%   Weight:    (!) 138.8 kg  Height:        Physical Exam: GEN: NAD, alert and oriented x 3, chronically ill appearance, appears older than stated age, obese HEENT: NCAT, PERRL, EOMI, sclera clear, MMM PULM: Breath sounds slight diminished bilateral bases, no wheezes/crackles, normal respiratory effort without accessory muscle use, on 2 L nasal cannula with SpO2 99% at rest CV: RRR w/o M/G/R GI: abd soft, NTND, + BS MSK: no peripheral edema, moves all extremities independently with preserved muscle strength NEURO: No focal neurological deficits PSYCH: normal mood/affect Integumentary: No concerning rashes/lesions/wounds noted on exposed  skin surfaces    The results of significant diagnostics from this hospitalization (including imaging, microbiology, ancillary and laboratory) are listed below for reference.     Microbiology: Recent Results (from the past 240 hours)  Resp panel by RT-PCR (RSV, Flu A&B, Covid) Anterior Nasal Swab     Status: Abnormal   Collection Time: 12/14/24 12:19 PM   Specimen: Anterior Nasal Swab  Result Value Ref Range Status   SARS Coronavirus 2 by RT PCR POSITIVE (A) NEGATIVE Final    Comment: (NOTE) SARS-CoV-2 target nucleic acids are DETECTED.  The SARS-CoV-2 RNA is generally detectable in upper respiratory specimens during the acute phase of infection. Positive results are indicative of the presence of the identified virus, but do not rule out bacterial infection or co-infection with other pathogens not detected by the test. Clinical correlation with patient history and other diagnostic information is necessary to determine patient infection status. The expected result is Negative.  Fact Sheet for Patients: bloggercourse.com  Fact Sheet for Healthcare Providers: seriousbroker.it  This test is not yet approved or cleared by the [place]  FDA and  has been authorized for detection and/or diagnosis of SARS-CoV-2 by FDA under an Emergency Use Authorization (EUA).  This EUA will remain in effect (meaning this test can be used) for the duration of  the COVID-19 declaration under Section 564(b)(1) of the A ct, 21 U.S.C. section 360bbb-3(b)(1), unless the authorization is terminated or revoked sooner.     Influenza A by PCR NEGATIVE NEGATIVE Final   Influenza B by PCR NEGATIVE NEGATIVE Final    Comment: (NOTE) The Xpert Xpress SARS-CoV-2/FLU/RSV plus assay is intended as an aid in the diagnosis of influenza from Nasopharyngeal swab specimens and should not be used as a sole basis for treatment. Nasal washings and aspirates are  unacceptable for Xpert Xpress SARS-CoV-2/FLU/RSV testing.  Fact Sheet for Patients: bloggercourse.com  Fact Sheet for Healthcare Providers: seriousbroker.it  This test is not yet approved or cleared by the [place]  FDA and has been authorized for detection and/or diagnosis of SARS-CoV-2 by FDA under an Emergency Use Authorization (EUA). This EUA will remain in effect (meaning this test can be used) for the duration of the COVID-19 declaration under Section 564(b)(1) of the Act, 21 U.S.C. section 360bbb-3(b)(1), unless the authorization is terminated or revoked.     Resp Syncytial Virus by PCR NEGATIVE NEGATIVE Final    Comment: (NOTE) Fact Sheet for Patients: bloggercourse.com  Fact Sheet for Healthcare Providers: seriousbroker.it  This test is not yet approved or cleared by the [place]  FDA and has been authorized for detection and/or diagnosis of SARS-CoV-2 by FDA  under an Emergency Use Authorization (EUA). This EUA will remain in effect (meaning this test can be used) for the duration of the COVID-19 declaration under Section 564(b)(1) of the Act, 21 U.S.C. section 360bbb-3(b)(1), unless the authorization is terminated or revoked.  Performed at [facility], [address] [place], [place] [address]   MRSA Next Gen by PCR, Nasal     Status: None   Collection Time: 12/14/24  5:14 PM   Specimen: Nasal Mucosa; Nasal Swab  Result Value Ref Range Status   MRSA by PCR Next Gen NOT DETECTED NOT DETECTED Final    Comment: (NOTE) The GeneXpert MRSA Assay (FDA approved for NASAL specimens only), is one component of a comprehensive MRSA colonization surveillance program. It is not intended to diagnose MRSA infection nor to guide or monitor treatment for MRSA infections. Test performance is not FDA approved in patients less than 18 years old. Performed at  [facility], [address], [place], [place] [address]      Labs: BNP (last 3 results) No results for input(s): BNP in the last 8760 hours. Basic Metabolic Panel: Recent Labs  Lab 12/18/24 0520 12/19/24 0503 12/20/24 0528 12/22/24 0532 12/23/24 0536  NA 141 141 139 139 140  K 3.5 3.8 3.6 3.8 4.4  CL 101 100 100 100 100  CO2 31 33* 34* 31 31  GLUCOSE 110* 110* 108* 93 95  BUN 16 14 11 15 15   CREATININE 0.79 0.67 9.35 0.67 0.84  CALCIUM  8.9 8.9 8.7* 9.0 9.2  MG 2.2 2.2 2.2 2.4  --   PHOS 5.1* 4.2 4.0 5.4*  --    Liver Function Tests: Recent Labs  Lab 12/18/24 0520 12/19/24 0503 12/20/24 0528 12/22/24 0532  AST 16 18 17 16   ALT 12 15 12 17   ALKPHOS 99 98 90 87  BILITOT 0.2 0.3 0.3 0.3  PROT 6.5 6.2* 6.0* 6.0*  ALBUMIN  3.7 3.4* 3.4* 3.3*   No results for input(s): LIPASE, AMYLASE in the last 168 hours. No results for input(s): AMMONIA in the last 168 hours. CBC: Recent Labs  Lab 12/18/24 0520 12/19/24 0503 12/20/24 0528 12/22/24 0532 12/23/24 0536  WBC 8.1 9.5 9.7 10.7* 11.6*  NEUTROABS 4.3 5.5 5.3 6.0  --   HGB 13.7 13.1 12.4 13.0 13.6  HCT 42.7 42.5 39.0 39.9 40.8  MCV 86.8 88.0 85.7 84.7 84.8  PLT 335 346 364 389 359   Cardiac Enzymes: No results for input(s): CKTOTAL, CKMB, CKMBINDEX, TROPONINI in the last 168 hours. BNP: Invalid input(s): POCBNP CBG: No results for input(s): GLUCAP in the last 168 hours. D-Dimer No results for input(s): DDIMER in the last 72 hours. Hgb A1c No results for input(s): HGBA1C in the last 72 hours. Lipid Profile No results for input(s): CHOL, HDL, LDLCALC, TRIG, CHOLHDL, LDLDIRECT in the last 72 hours. Thyroid  function studies No results for input(s): TSH, T4TOTAL, T3FREE, THYROIDAB in the last 72 hours.  Invalid input(s): FREET3 Anemia work up No results for input(s): VITAMINB12, FOLATE, FERRITIN, TIBC, IRON, RETICCTPCT in the last 72  hours. Urinalysis No results found for: COLORURINE, APPEARANCEUR, LABSPEC, PHURINE, GLUCOSEU, HGBUR, BILIRUBINUR, KETONESUR, PROTEINUR, UROBILINOGEN, NITRITE, LEUKOCYTESUR Sepsis Labs Recent Labs  Lab 12/19/24 0503 12/20/24 0528 12/22/24 0532 12/23/24 0536  WBC 9.5 9.7 10.7* 11.6*   Microbiology Recent Results (from the past 240 hours)  Resp panel by RT-PCR (RSV, Flu A&B, Covid) Anterior Nasal Swab     Status: Abnormal   Collection Time: 12/14/24  12:19 PM   Specimen: Anterior Nasal Swab  Result Value Ref Range Status   SARS Coronavirus 2 by RT PCR POSITIVE (A) NEGATIVE Final    Comment: (NOTE) SARS-CoV-2 target nucleic acids are DETECTED.  The SARS-CoV-2 RNA is generally detectable in upper respiratory specimens during the acute phase of infection. Positive results are indicative of the presence of the identified virus, but do not rule out bacterial infection or co-infection with other pathogens not detected by the test. Clinical correlation with patient history and other diagnostic information is necessary to determine patient infection status. The expected result is Negative.  Fact Sheet for Patients: bloggercourse.com  Fact Sheet for Healthcare Providers: seriousbroker.it  This test is not yet approved or cleared by the [place]  FDA and  has been authorized for detection and/or diagnosis of SARS-CoV-2 by FDA under an Emergency Use Authorization (EUA).  This EUA will remain in effect (meaning this test can be used) for the duration of  the COVID-19 declaration under Section 564(b)(1) of the A ct, 21 U.S.C. section 360bbb-3(b)(1), unless the authorization is terminated or revoked sooner.     Influenza A by PCR NEGATIVE NEGATIVE Final   Influenza B by PCR NEGATIVE NEGATIVE Final    Comment: (NOTE) The Xpert Xpress SARS-CoV-2/FLU/RSV plus assay is intended as an aid in the diagnosis of  influenza from Nasopharyngeal swab specimens and should not be used as a sole basis for treatment. Nasal washings and aspirates are unacceptable for Xpert Xpress SARS-CoV-2/FLU/RSV testing.  Fact Sheet for Patients: bloggercourse.com  Fact Sheet for Healthcare Providers: seriousbroker.it  This test is not yet approved or cleared by the [place]  FDA and has been authorized for detection and/or diagnosis of SARS-CoV-2 by FDA under an Emergency Use Authorization (EUA). This EUA will remain in effect (meaning this test can be used) for the duration of the COVID-19 declaration under Section 564(b)(1) of the Act, 21 U.S.C. section 360bbb-3(b)(1), unless the authorization is terminated or revoked.     Resp Syncytial Virus by PCR NEGATIVE NEGATIVE Final    Comment: (NOTE) Fact Sheet for Patients: bloggercourse.com  Fact Sheet for Healthcare Providers: seriousbroker.it  This test is not yet approved or cleared by the [place]  FDA and has been authorized for detection and/or diagnosis of SARS-CoV-2 by FDA under an Emergency Use Authorization (EUA). This EUA will remain in effect (meaning this test can be used) for the duration of the COVID-19 declaration under Section 564(b)(1) of the Act, 21 U.S.C. section 360bbb-3(b)(1), unless the authorization is terminated or revoked.  Performed at [facility], [address] [place], [place] [address]   MRSA Next Gen by PCR, Nasal     Status: None   Collection Time: 12/14/24  5:14 PM   Specimen: Nasal Mucosa; Nasal Swab  Result Value Ref Range Status   MRSA by PCR Next Gen NOT DETECTED NOT DETECTED Final    Comment: (NOTE) The GeneXpert MRSA Assay (FDA approved for NASAL specimens only), is one component of a comprehensive MRSA colonization surveillance program. It is not intended to diagnose MRSA infection nor to  guide or monitor treatment for MRSA infections. Test performance is not FDA approved in patients less than 74 years old. Performed at [facility], [address] [place], [place] [address]      Time coordinating discharge: Over 30 minutes  SIGNED:   Camellia PARAS Krystalle Pilkington, DO  [facility] 12/24/2024, 8:45 AM     [1] No Known Allergies  "

## 2024-12-24 NOTE — TOC Transition Note (Signed)
 Transition of Care [facility]) - Discharge Note   Patient Details  Name: Pamela [name] MRN: [id number] Date of Birth: 1961-09-17  [facility]) CM/SW Contact:  Bascom Service, RN Phone Number: 12/24/2024, 10:45 AM   Clinical Narrative:  d/c home.[employer] rep Jermaine has  already delivered home 02 travel tank to rm. Patient to transport by uber(she will set up on own) No further CM needs.    Final next level of care: Home/Self Care Barriers to Discharge: No Barriers Identified   Patient Goals and CMS Choice Patient states their goals for this hospitalization and ongoing recovery are:: Home CMS Medicare.gov Compare Post Acute Care list provided to:: Patient Choice offered to / list presented to : Patient Dent ownership interest in [facility].provided to:: Patient    Discharge Placement                       Discharge Plan and Services Additional resources added to the After Visit Summary for     Discharge Planning Services: CM Consult Post Acute Care Choice: Durable Medical Equipment          DME Arranged: Oxygen DME Agency: [employer] Date DME Agency Contacted: 12/21/24 Time DME Agency Contacted: [phone] Representative spoke with at DME Agency: [place]            Social Drivers of Health (SDOH) Interventions SDOH Screenings   Food Insecurity: Unknown (12/16/2024)  Recent Concern: Food Insecurity - Food Insecurity Present (12/14/2024)  Housing: Low Risk (12/14/2024)  Transportation Needs: No Transportation Needs (12/14/2024)  Utilities: Not At Risk (12/14/2024)  Depression (PHQ2-9): High Risk (11/04/2024)  Financial Resource Strain: Medium Risk (03/23/2024)  Physical Activity: Insufficiently Active (03/23/2024)  Social Connections: Moderately Integrated (03/23/2024)  Stress: Stress Concern Present (03/23/2024)  Tobacco Use: Medium Risk (12/14/2024)     Readmission Risk Interventions     No data to display

## 2024-12-24 NOTE — Progress Notes (Signed)
 Discharge instructions reviewed with patient, verbalized understanding. All questions answered. All belongings accounted for. Patient to follow up with MD in  1-2 weeks. PIV removed.

## 2024-12-24 NOTE — Progress Notes (Signed)
Home O2

## 2024-12-25 ENCOUNTER — Telehealth: Payer: Self-pay | Admitting: *Deleted

## 2024-12-25 NOTE — Transitions of Care (Post Inpatient/ED Visit) (Signed)
" ° °  12/25/2024  Name: Emmeline Winebarger MRN: [id number] DOB: Feb 22, 1961  Today's TOC FU Call Status: Today's TOC FU Call Status:: Unsuccessful Call (1st Attempt) Unsuccessful Call (1st Attempt) Date: 12/25/24  Attempted to reach the patient regarding the most recent Inpatient visit  Left HIPAA compliant voice message   Follow Up Plan: Additional outreach attempts will be made to reach the patient to complete the Transitions of Care (Post Inpatient/ED visit) call.   Pls call/ message for questions,  Syris Brookens Mckinney Cyenna Rebello, RN, BSN, CCRN Alumnus RN Care Manager  Transitions of Care  [facility] - [facility] [phone]: direct office  "

## 2024-12-28 ENCOUNTER — Telehealth: Payer: Self-pay

## 2024-12-28 NOTE — TOC CM/SW Note (Signed)
-  2:26p Received message from nsg that patient was asking questions about home 02-[employer] rep Jermaine who delivered home 02 travel tank to rm-he will f/u on patient additional questions. No further CM needs.

## 2024-12-29 ENCOUNTER — Other Ambulatory Visit: Payer: Self-pay

## 2024-12-29 DIAGNOSIS — J449 Chronic obstructive pulmonary disease, unspecified: Secondary | ICD-10-CM

## 2025-01-01 ENCOUNTER — Other Ambulatory Visit (INDEPENDENT_AMBULATORY_CARE_PROVIDER_SITE_OTHER): Payer: Self-pay | Admitting: Otolaryngology

## 2025-01-01 ENCOUNTER — Other Ambulatory Visit: Payer: Self-pay

## 2025-01-01 ENCOUNTER — Other Ambulatory Visit (HOSPITAL_COMMUNITY): Payer: Self-pay

## 2025-01-07 ENCOUNTER — Ambulatory Visit (INDEPENDENT_AMBULATORY_CARE_PROVIDER_SITE_OTHER): Admitting: Nurse Practitioner

## 2025-01-18 ENCOUNTER — Ambulatory Visit (HOSPITAL_BASED_OUTPATIENT_CLINIC_OR_DEPARTMENT_OTHER): Admitting: Pulmonary Disease
